# Patient Record
Sex: Female | Born: 1950 | Race: White | Hispanic: No | Marital: Married | Smoking: Former smoker
Health system: Southern US, Community
[De-identification: ages and names within clinical notes are randomized; demographics above are authoritative.]

## PROBLEM LIST (undated history)

## (undated) DIAGNOSIS — M797 Fibromyalgia: Secondary | ICD-10-CM

## (undated) DIAGNOSIS — R569 Unspecified convulsions: Secondary | ICD-10-CM

## (undated) DIAGNOSIS — F419 Anxiety disorder, unspecified: Secondary | ICD-10-CM

## (undated) DIAGNOSIS — F32A Depression, unspecified: Secondary | ICD-10-CM

## (undated) DIAGNOSIS — Z8489 Family history of other specified conditions: Secondary | ICD-10-CM

## (undated) DIAGNOSIS — I10 Essential (primary) hypertension: Secondary | ICD-10-CM

## (undated) DIAGNOSIS — D649 Anemia, unspecified: Secondary | ICD-10-CM

## (undated) DIAGNOSIS — E039 Hypothyroidism, unspecified: Secondary | ICD-10-CM

## (undated) DIAGNOSIS — M1990 Unspecified osteoarthritis, unspecified site: Secondary | ICD-10-CM

## (undated) DIAGNOSIS — J449 Chronic obstructive pulmonary disease, unspecified: Secondary | ICD-10-CM

## (undated) DIAGNOSIS — R519 Headache, unspecified: Secondary | ICD-10-CM

## (undated) DIAGNOSIS — J45909 Unspecified asthma, uncomplicated: Secondary | ICD-10-CM

## (undated) DIAGNOSIS — I2510 Atherosclerotic heart disease of native coronary artery without angina pectoris: Secondary | ICD-10-CM

## (undated) DIAGNOSIS — G4730 Sleep apnea, unspecified: Secondary | ICD-10-CM

## (undated) DIAGNOSIS — G629 Polyneuropathy, unspecified: Secondary | ICD-10-CM

## (undated) DIAGNOSIS — R51 Headache: Secondary | ICD-10-CM

## (undated) DIAGNOSIS — G2581 Restless legs syndrome: Secondary | ICD-10-CM

## (undated) DIAGNOSIS — R0600 Dyspnea, unspecified: Secondary | ICD-10-CM

## (undated) DIAGNOSIS — E119 Type 2 diabetes mellitus without complications: Secondary | ICD-10-CM

## (undated) DIAGNOSIS — I499 Cardiac arrhythmia, unspecified: Secondary | ICD-10-CM

## (undated) DIAGNOSIS — F329 Major depressive disorder, single episode, unspecified: Secondary | ICD-10-CM

## (undated) DIAGNOSIS — R918 Other nonspecific abnormal finding of lung field: Secondary | ICD-10-CM

## (undated) HISTORY — PX: BACK SURGERY: SHX140

## (undated) HISTORY — PX: CORONARY ANGIOPLASTY: SHX604

## (undated) HISTORY — PX: TUBAL LIGATION: SHX77

## (undated) HISTORY — PX: TONSILLECTOMY: SUR1361

## (undated) HISTORY — PX: HAND SURGERY: SHX662

## (undated) HISTORY — PX: EYE SURGERY: SHX253

## (undated) HISTORY — PX: SHOULDER SURGERY: SHX246

---

## 1970-12-29 HISTORY — PX: APPENDECTOMY: SHX54

## 1972-12-29 HISTORY — PX: CHOLECYSTECTOMY: SHX55

## 1998-12-29 HISTORY — PX: ABDOMINAL HYSTERECTOMY: SHX81

## 2003-12-30 HISTORY — PX: CARDIAC CATHETERIZATION: SHX172

## 2009-11-30 ENCOUNTER — Encounter: Admission: RE | Admit: 2009-11-30 | Discharge: 2009-11-30 | Payer: Self-pay | Admitting: Orthopedic Surgery

## 2010-03-01 ENCOUNTER — Encounter: Admission: RE | Admit: 2010-03-01 | Discharge: 2010-03-01 | Payer: Self-pay | Admitting: Orthopedic Surgery

## 2014-10-23 ENCOUNTER — Other Ambulatory Visit: Payer: Self-pay | Admitting: Neurological Surgery

## 2014-10-30 ENCOUNTER — Encounter (HOSPITAL_COMMUNITY): Payer: Self-pay

## 2014-10-30 ENCOUNTER — Encounter (HOSPITAL_COMMUNITY)
Admission: RE | Admit: 2014-10-30 | Discharge: 2014-10-30 | Disposition: A | Discharge status: Home / Self Care | Payer: Medicare Other | Source: Ambulatory Visit | Attending: Neurological Surgery | Admitting: Neurological Surgery

## 2014-10-30 DIAGNOSIS — M797 Fibromyalgia: Secondary | ICD-10-CM | POA: Insufficient documentation

## 2014-10-30 DIAGNOSIS — F329 Major depressive disorder, single episode, unspecified: Secondary | ICD-10-CM | POA: Diagnosis not present

## 2014-10-30 DIAGNOSIS — Z22321 Carrier or suspected carrier of Methicillin susceptible Staphylococcus aureus: Secondary | ICD-10-CM | POA: Insufficient documentation

## 2014-10-30 DIAGNOSIS — Z01818 Encounter for other preprocedural examination: Secondary | ICD-10-CM | POA: Insufficient documentation

## 2014-10-30 DIAGNOSIS — I10 Essential (primary) hypertension: Secondary | ICD-10-CM | POA: Diagnosis not present

## 2014-10-30 DIAGNOSIS — Z87891 Personal history of nicotine dependence: Secondary | ICD-10-CM | POA: Diagnosis not present

## 2014-10-30 DIAGNOSIS — G4733 Obstructive sleep apnea (adult) (pediatric): Secondary | ICD-10-CM | POA: Insufficient documentation

## 2014-10-30 DIAGNOSIS — J45909 Unspecified asthma, uncomplicated: Secondary | ICD-10-CM | POA: Diagnosis not present

## 2014-10-30 DIAGNOSIS — D649 Anemia, unspecified: Secondary | ICD-10-CM | POA: Insufficient documentation

## 2014-10-30 DIAGNOSIS — I2510 Atherosclerotic heart disease of native coronary artery without angina pectoris: Secondary | ICD-10-CM | POA: Insufficient documentation

## 2014-10-30 DIAGNOSIS — R0789 Other chest pain: Secondary | ICD-10-CM | POA: Insufficient documentation

## 2014-10-30 DIAGNOSIS — J449 Chronic obstructive pulmonary disease, unspecified: Secondary | ICD-10-CM | POA: Diagnosis not present

## 2014-10-30 DIAGNOSIS — Z9049 Acquired absence of other specified parts of digestive tract: Secondary | ICD-10-CM | POA: Diagnosis not present

## 2014-10-30 DIAGNOSIS — I498 Other specified cardiac arrhythmias: Secondary | ICD-10-CM | POA: Diagnosis not present

## 2014-10-30 DIAGNOSIS — R002 Palpitations: Secondary | ICD-10-CM | POA: Insufficient documentation

## 2014-10-30 DIAGNOSIS — F419 Anxiety disorder, unspecified: Secondary | ICD-10-CM | POA: Insufficient documentation

## 2014-10-30 DIAGNOSIS — E039 Hypothyroidism, unspecified: Secondary | ICD-10-CM | POA: Insufficient documentation

## 2014-10-30 DIAGNOSIS — Z90710 Acquired absence of both cervix and uterus: Secondary | ICD-10-CM | POA: Diagnosis not present

## 2014-10-30 DIAGNOSIS — M48061 Spinal stenosis, lumbar region without neurogenic claudication: Secondary | ICD-10-CM

## 2014-10-30 HISTORY — DX: Headache: R51

## 2014-10-30 HISTORY — DX: Headache, unspecified: R51.9

## 2014-10-30 HISTORY — DX: Hypothyroidism, unspecified: E03.9

## 2014-10-30 HISTORY — DX: Cardiac arrhythmia, unspecified: I49.9

## 2014-10-30 HISTORY — DX: Unspecified asthma, uncomplicated: J45.909

## 2014-10-30 HISTORY — DX: Essential (primary) hypertension: I10

## 2014-10-30 HISTORY — DX: Atherosclerotic heart disease of native coronary artery without angina pectoris: I25.10

## 2014-10-30 HISTORY — DX: Depression, unspecified: F32.A

## 2014-10-30 HISTORY — DX: Chronic obstructive pulmonary disease, unspecified: J44.9

## 2014-10-30 HISTORY — DX: Anxiety disorder, unspecified: F41.9

## 2014-10-30 HISTORY — DX: Sleep apnea, unspecified: G47.30

## 2014-10-30 HISTORY — DX: Major depressive disorder, single episode, unspecified: F32.9

## 2014-10-30 HISTORY — DX: Unspecified osteoarthritis, unspecified site: M19.90

## 2014-10-30 HISTORY — DX: Anemia, unspecified: D64.9

## 2014-10-30 HISTORY — DX: Fibromyalgia: M79.7

## 2014-10-30 HISTORY — DX: Family history of other specified conditions: Z84.89

## 2014-10-30 LAB — PROTIME-INR
INR: 1.09 (ref 0.00–1.49)
Prothrombin Time: 14.3 seconds (ref 11.6–15.2)

## 2014-10-30 LAB — BASIC METABOLIC PANEL
ANION GAP: 15 (ref 5–15)
BUN: 26 mg/dL — ABNORMAL HIGH (ref 6–23)
CHLORIDE: 96 meq/L (ref 96–112)
CO2: 26 meq/L (ref 19–32)
Calcium: 10 mg/dL (ref 8.4–10.5)
Creatinine, Ser: 0.99 mg/dL (ref 0.50–1.10)
GFR calc Af Amer: 69 mL/min — ABNORMAL LOW (ref >90)
GFR calc non Af Amer: 59 mL/min — ABNORMAL LOW (ref >90)
Glucose, Bld: 182 mg/dL — ABNORMAL HIGH (ref 70–99)
POTASSIUM: 3.4 meq/L — AB (ref 3.7–5.3)
Sodium: 137 mEq/L (ref 137–147)

## 2014-10-30 LAB — CBC WITH DIFFERENTIAL/PLATELET
Basophils Absolute: 0.1 10*3/uL (ref 0.0–0.1)
Basophils Relative: 1 % (ref 0–1)
Eosinophils Absolute: 0.1 10*3/uL (ref 0.0–0.7)
Eosinophils Relative: 2 % (ref 0–5)
HEMATOCRIT: 36.8 % (ref 36.0–46.0)
HEMOGLOBIN: 12.3 g/dL (ref 12.0–15.0)
LYMPHS ABS: 1.9 10*3/uL (ref 0.7–4.0)
LYMPHS PCT: 32 % (ref 12–46)
MCH: 29.2 pg (ref 26.0–34.0)
MCHC: 33.4 g/dL (ref 30.0–36.0)
MCV: 87.4 fL (ref 78.0–100.0)
MONOS PCT: 10 % (ref 3–12)
Monocytes Absolute: 0.6 10*3/uL (ref 0.1–1.0)
NEUTROS ABS: 3.2 10*3/uL (ref 1.7–7.7)
NEUTROS PCT: 55 % (ref 43–77)
Platelets: 201 10*3/uL (ref 150–400)
RBC: 4.21 MIL/uL (ref 3.87–5.11)
RDW: 15 % (ref 11.5–15.5)
WBC: 5.8 10*3/uL (ref 4.0–10.5)

## 2014-10-30 LAB — SURGICAL PCR SCREEN
MRSA, PCR: NEGATIVE
STAPHYLOCOCCUS AUREUS: POSITIVE — AB

## 2014-10-30 NOTE — Pre-Procedure Instructions (Signed)
Elder NegusLinda Benoist  10/30/2014   Your procedure is scheduled on:  Friday November 6 th at 0730 AM  [facility] at [facility] AM.  Call this number if you have problems the morning of surgery: [phone]   Remember:   Do not eat food or drink liquids after midnight.   Take these medicines the morning of surgery with A SIP OF WATER: Buspar, Klonopin, Gabapentin, Hydrocodone if needed for pain, Synthroid, Singular, Phenytoin Metoprolol,Singular, Phenytoin and Effexor   Do not wear jewelry, make-up or nail polish.  Do not wear lotions, powders, or perfumes. You may not wear deodorant.  Do not shave 48 hours prior to surgery.   Do not bring valuables to the hospital.  [facility] is not responsible for any belongings or valuables.               Contacts, dentures or bridgework may not be worn into surgery.  Leave suitcase in the car. After surgery it may be brought to your room.  For patients admitted to the hospital, discharge time is determined by your treatment team.               Patients discharged the day of surgery will not be allowed to drive home.    Special Instructions: [place] - Preparing for Surgery  Before surgery, you can play an important role.  Because skin is not sterile, your skin needs to be as free of germs as possible.  You can reduce the number of germs on you skin by washing with CHG (chlorahexidine gluconate) soap before surgery.  CHG is an antiseptic cleaner which kills germs and bonds with the skin to continue killing germs even after washing.  Please DO NOT use if you have an allergy to CHG or antibacterial soaps.  If your skin becomes reddened/irritated stop using the CHG and inform your nurse when you arrive at Short Stay.  Do not shave (including legs and underarms) for at least 48 hours prior to the first CHG shower.  You may shave your face.  Please follow these instructions carefully:   1.  Shower with CHG Soap the night before  surgery and the                                morning of Surgery.  2.  If you choose to wash your hair, wash your hair first as usual with your       normal shampoo.  3.  After you shampoo, rinse your hair and body thoroughly to remove the                      Shampoo.  4.  Use CHG as you would any other liquid soap.  You can apply chg directly       to the skin and wash gently with scrungie or a clean washcloth.  5.  Apply the CHG Soap to your body ONLY FROM THE NECK DOWN.        Do not use on open wounds or open sores.  Avoid contact with your eyes,       ears, mouth and genitals (private parts).  Wash genitals (private parts)       with your normal soap.  6.  Wash thoroughly, paying special attention to the area where your surgery        will be performed.  7.  Thoroughly rinse your body with warm water from the neck down.  8.  DO NOT shower/wash with your normal soap after using and rinsing off       the CHG Soap.  9.  Pat yourself dry with a clean towel.            10.  Wear clean pajamas.            11.  Place clean sheets on your bed the night of your first shower and do not        sleep with pets.  Day of Surgery  Do not apply any lotions/deoderants the morning of surgery.  Please wear clean clothes to the hospital/surgery center.      Please read over the following fact sheets that you were given: Pain Booklet, Coughing and Deep Breathing, MRSA Information and Surgical Site Infection Prevention

## 2014-10-31 NOTE — Progress Notes (Addendum)
Anesthesia Chart Review: Patient is a 63 year old female scheduled for left L3-4 intra/extra- foraminal microdiskectomy on 11/03/14 by Dr. Yetta BarreJones.   History includes former smoker, HTN, CAD s/p LAD stent ~ '05 (s/p PCI to LAD 2008 mild disease elsewhere at that time according to cardiology notes), COPD, OSA with CPAP use, asthma, depression, fibromyalgia, hypothyroidism, anxiety, anemia, hysterectomy, cholecystectomy. For anesthesia history, she reported her mother woke up during a surgery. PCP is note listed, was seeing Dr. Harl Bowieathy Judge at least as of earlier this year. Cardiologist is Dr. Donaciano EvaShannon Mitch Alene MiresSt. Clair, last visit 01/25/14 according to Care Everywhere. Patient had occasional atypical chest pain and occasional palpitations.  Dr. Alene MiresSt. Clair offered a stress test if patient's symptoms worsened.  He did order an event monitor, but I'm unsure if this has been done yet or not.   Meds include albuterol, ASA 81 mg, Buspar, Klonipin, Flexeril, Lasix, Neurontin, Lopid, glipizide, Lantus, levothyroxine, lisinopril, Lopressor, Singulair, dilantin, Topamax, Effexor, Dyazide.  EKG on 10/30/14: SR with marked sinus arrhythmia, cannot rule out anterior infarct (age undetermined).  Currently, no comparison EKGs, but according to records in Care Everywhere she had changes of septal infarct cited on EKGs dating back to 02/01/12.    According to cardiology notes in Care Everywhere: -Echocardiogram April 2014 technically difficult, EF 55-60%, G1DD -July 2013 ER evaluation BNP 13, troponin negative x2 -Nuclear stress test 2012 negative for ischemia, EF 74%  Preoperative CXR and labs noted.    I'm still awaiting full cardiology testing reports, but above note and 01/25/14 cardiology office note reviewed with anesthesiologist Dr. Katrinka BlazingSmith.  Patient will be further evaluated by her assigned anesthesiologist on the day of surgery.  If no progressive or new CV symptoms then it is anticipated that she can proceed with this  procedure.  I'll review additional records if received.  Velna Ochsllison Xavian Hardcastle, PA-C [facility]/Anesthesiology Phone [phone] 10/31/2014 5:21 PM  Addendum: [facility] and [facility] received.    No 2015 event monitor.  - Echo: 04/25/13: The study was technically difficult. Images of the previous study from 2001 unavailable for comparison. The left ventricle is normal in size, wall thickness and wall motion with ejection fraction of 55-60%. Grade 1 mild diastolic dysfunction, abnormal relaxation pattern. Estimation of right ventricular systolic pressure is not possible. The aortic valve is not well visualized, but is grossly normal. Trace MR/TR/AR/PR.  - Normal nuclear cardiac perfusion scan on 07/28/11.  Velna Ochsllison Lister Brizzi, PA-C [facility]/Anesthesiology Phone [phone] 11/01/2014 10:56 AM

## 2014-10-31 NOTE — Progress Notes (Signed)
Mupirocin Ointment Rx [facility] for positive PCR of staph. Left message on pt's voicemail notifying her of same.

## 2014-11-02 MED ORDER — VANCOMYCIN HCL 10 G IV SOLR
1500.0000 mg | INTRAVENOUS | Status: AC
  Administered 2014-11-03: 1500 mg via INTRAVENOUS
  Filled 2014-11-02: qty 1500

## 2014-11-02 MED ORDER — CEFAZOLIN SODIUM-DEXTROSE 2-3 GM-% IV SOLR: 2.0000 g | INTRAVENOUS | Status: DC

## 2014-11-03 ENCOUNTER — Ambulatory Visit (HOSPITAL_COMMUNITY)
Admission: RE | Admit: 2014-11-03 | Discharge: 2014-11-04 | Disposition: A | Discharge status: Home / Self Care | Payer: Medicare Other | Source: Ambulatory Visit | Attending: Neurological Surgery | Admitting: Neurological Surgery

## 2014-11-03 ENCOUNTER — Inpatient Hospital Stay (HOSPITAL_COMMUNITY): Payer: Medicare Other | Admitting: Vascular Surgery

## 2014-11-03 ENCOUNTER — Inpatient Hospital Stay (HOSPITAL_COMMUNITY): Payer: Medicare Other | Admitting: Anesthesiology

## 2014-11-03 ENCOUNTER — Encounter (HOSPITAL_COMMUNITY)
Admission: RE | Disposition: A | Discharge status: Home / Self Care | Payer: Self-pay | Source: Ambulatory Visit | Attending: Neurological Surgery

## 2014-11-03 ENCOUNTER — Encounter (HOSPITAL_COMMUNITY): Payer: Self-pay | Admitting: *Deleted

## 2014-11-03 ENCOUNTER — Inpatient Hospital Stay (HOSPITAL_COMMUNITY): Payer: Medicare Other

## 2014-11-03 DIAGNOSIS — M48061 Spinal stenosis, lumbar region without neurogenic claudication: Secondary | ICD-10-CM

## 2014-11-03 DIAGNOSIS — Z794 Long term (current) use of insulin: Secondary | ICD-10-CM | POA: Insufficient documentation

## 2014-11-03 DIAGNOSIS — E119 Type 2 diabetes mellitus without complications: Secondary | ICD-10-CM | POA: Diagnosis not present

## 2014-11-03 DIAGNOSIS — I10 Essential (primary) hypertension: Secondary | ICD-10-CM | POA: Diagnosis not present

## 2014-11-03 DIAGNOSIS — Z6841 Body Mass Index (BMI) 40.0 and over, adult: Secondary | ICD-10-CM | POA: Diagnosis not present

## 2014-11-03 DIAGNOSIS — E669 Obesity, unspecified: Secondary | ICD-10-CM | POA: Diagnosis not present

## 2014-11-03 DIAGNOSIS — M5116 Intervertebral disc disorders with radiculopathy, lumbar region: Secondary | ICD-10-CM | POA: Diagnosis present

## 2014-11-03 DIAGNOSIS — J449 Chronic obstructive pulmonary disease, unspecified: Secondary | ICD-10-CM | POA: Insufficient documentation

## 2014-11-03 DIAGNOSIS — Z79899 Other long term (current) drug therapy: Secondary | ICD-10-CM | POA: Insufficient documentation

## 2014-11-03 DIAGNOSIS — Z7982 Long term (current) use of aspirin: Secondary | ICD-10-CM | POA: Insufficient documentation

## 2014-11-03 DIAGNOSIS — Z9889 Other specified postprocedural states: Secondary | ICD-10-CM

## 2014-11-03 DIAGNOSIS — Z87891 Personal history of nicotine dependence: Secondary | ICD-10-CM | POA: Diagnosis not present

## 2014-11-03 HISTORY — DX: Type 2 diabetes mellitus without complications: E11.9

## 2014-11-03 HISTORY — PX: LUMBAR LAMINECTOMY/DECOMPRESSION MICRODISCECTOMY: SHX5026

## 2014-11-03 LAB — GLUCOSE, CAPILLARY
GLUCOSE-CAPILLARY: 128 mg/dL — AB (ref 70–99)
GLUCOSE-CAPILLARY: 209 mg/dL — AB (ref 70–99)
Glucose-Capillary: 231 mg/dL — ABNORMAL HIGH (ref 70–99)
Glucose-Capillary: 180 mg/dL — ABNORMAL HIGH (ref 70–99)

## 2014-11-03 SURGERY — LUMBAR LAMINECTOMY/DECOMPRESSION MICRODISCECTOMY 1 LEVEL
Anesthesia: General | Site: Back | Laterality: Left

## 2014-11-03 MED ORDER — ALBUTEROL SULFATE HFA 108 (90 BASE) MCG/ACT IN AERS
INHALATION_SPRAY | RESPIRATORY_TRACT | Status: AC
  Filled 2014-11-03: qty 6.7

## 2014-11-03 MED ORDER — BUSPIRONE HCL 15 MG PO TABS
30.0000 mg | ORAL_TABLET | Freq: Every day | ORAL | Status: DC
  Administered 2014-11-03: 30 mg via ORAL
  Filled 2014-11-03 (×2): qty 2

## 2014-11-03 MED ORDER — MENTHOL 3 MG MT LOZG: 1.0000 | LOZENGE | OROMUCOSAL | Status: DC | PRN

## 2014-11-03 MED ORDER — MEPERIDINE HCL 25 MG/ML IJ SOLN: 6.2500 mg | INTRAMUSCULAR | Status: DC | PRN

## 2014-11-03 MED ORDER — LIDOCAINE HCL (CARDIAC) 20 MG/ML IV SOLN
INTRAVENOUS | Status: AC
  Filled 2014-11-03: qty 5

## 2014-11-03 MED ORDER — OXYCODONE-ACETAMINOPHEN 5-325 MG PO TABS
1.0000 | ORAL_TABLET | ORAL | Status: DC | PRN
  Administered 2014-11-03 – 2014-11-04 (×4): 2 via ORAL
  Filled 2014-11-03 (×4): qty 2

## 2014-11-03 MED ORDER — EPHEDRINE SULFATE 50 MG/ML IJ SOLN
INTRAMUSCULAR | Status: AC
  Filled 2014-11-03: qty 1

## 2014-11-03 MED ORDER — CYCLOBENZAPRINE HCL 5 MG PO TABS
5.0000 mg | ORAL_TABLET | Freq: Three times a day (TID) | ORAL | Status: DC | PRN
  Filled 2014-11-03: qty 2

## 2014-11-03 MED ORDER — GABAPENTIN 300 MG PO CAPS
300.0000 mg | ORAL_CAPSULE | Freq: Three times a day (TID) | ORAL | Status: DC
  Administered 2014-11-03 – 2014-11-04 (×3): 300 mg via ORAL
  Filled 2014-11-03 (×5): qty 1

## 2014-11-03 MED ORDER — DEXAMETHASONE SODIUM PHOSPHATE 10 MG/ML IJ SOLN: 10.0000 mg | INTRAMUSCULAR | Status: DC

## 2014-11-03 MED ORDER — ALBUTEROL SULFATE HFA 108 (90 BASE) MCG/ACT IN AERS
INHALATION_SPRAY | RESPIRATORY_TRACT | Status: DC | PRN
  Administered 2014-11-03: 2 via RESPIRATORY_TRACT

## 2014-11-03 MED ORDER — LIDOCAINE HCL (CARDIAC) 20 MG/ML IV SOLN
INTRAVENOUS | Status: DC | PRN
  Administered 2014-11-03: 100 mg via INTRAVENOUS

## 2014-11-03 MED ORDER — PROMETHAZINE HCL 25 MG/ML IJ SOLN: 6.2500 mg | INTRAMUSCULAR | Status: DC | PRN

## 2014-11-03 MED ORDER — MONTELUKAST SODIUM 10 MG PO TABS
10.0000 mg | ORAL_TABLET | Freq: Every morning | ORAL | Status: DC
  Administered 2014-11-04: 10 mg via ORAL
  Filled 2014-11-03: qty 1

## 2014-11-03 MED ORDER — THROMBIN 5000 UNITS EX SOLR
OROMUCOSAL | Status: DC | PRN
  Administered 2014-11-03: 09:00:00 via TOPICAL

## 2014-11-03 MED ORDER — BUSPIRONE HCL 15 MG PO TABS: 15.0000 mg | ORAL_TABLET | Freq: Two times a day (BID) | ORAL | Status: DC

## 2014-11-03 MED ORDER — NEOSTIGMINE METHYLSULFATE 10 MG/10ML IV SOLN
INTRAVENOUS | Status: DC | PRN
  Administered 2014-11-03: 3 mg via INTRAVENOUS

## 2014-11-03 MED ORDER — LACTATED RINGERS IV SOLN
INTRAVENOUS | Status: DC | PRN
Start: 2014-11-03 — End: 2014-11-03
  Administered 2014-11-03 (×2): via INTRAVENOUS

## 2014-11-03 MED ORDER — GLUCOSE 4 G PO CHEW
1.0000 | CHEWABLE_TABLET | ORAL | Status: DC | PRN
  Filled 2014-11-03: qty 1

## 2014-11-03 MED ORDER — GLYCOPYRROLATE 0.2 MG/ML IJ SOLN
INTRAMUSCULAR | Status: DC | PRN
  Administered 2014-11-03: 0.4 mg via INTRAVENOUS

## 2014-11-03 MED ORDER — GLYCOPYRROLATE 0.2 MG/ML IJ SOLN
INTRAMUSCULAR | Status: AC
  Filled 2014-11-03: qty 4

## 2014-11-03 MED ORDER — HEMOSTATIC AGENTS (NO CHARGE) OPTIME
TOPICAL | Status: DC | PRN
  Administered 2014-11-03: 1 via TOPICAL

## 2014-11-03 MED ORDER — PROPOFOL 10 MG/ML IV BOLUS
INTRAVENOUS | Status: AC
  Filled 2014-11-03: qty 20

## 2014-11-03 MED ORDER — FENTANYL CITRATE 0.05 MG/ML IJ SOLN
INTRAMUSCULAR | Status: DC | PRN
  Administered 2014-11-03: 100 ug via INTRAVENOUS
  Administered 2014-11-03 (×2): 50 ug via INTRAVENOUS

## 2014-11-03 MED ORDER — PHENYLEPHRINE 40 MCG/ML (10ML) SYRINGE FOR IV PUSH (FOR BLOOD PRESSURE SUPPORT)
PREFILLED_SYRINGE | INTRAVENOUS | Status: AC
  Filled 2014-11-03: qty 10

## 2014-11-03 MED ORDER — SODIUM CHLORIDE 0.9 % IR SOLN
Status: DC | PRN
  Administered 2014-11-03: 08:00:00

## 2014-11-03 MED ORDER — BUPIVACAINE HCL (PF) 0.25 % IJ SOLN
INTRAMUSCULAR | Status: DC | PRN
  Administered 2014-11-03: 4 mL

## 2014-11-03 MED ORDER — PROPOFOL 10 MG/ML IV BOLUS
INTRAVENOUS | Status: DC | PRN
  Administered 2014-11-03: 180 mg via INTRAVENOUS

## 2014-11-03 MED ORDER — ARTIFICIAL TEARS OP OINT
TOPICAL_OINTMENT | OPHTHALMIC | Status: AC
  Filled 2014-11-03: qty 3.5

## 2014-11-03 MED ORDER — CELECOXIB 200 MG PO CAPS
200.0000 mg | ORAL_CAPSULE | Freq: Every day | ORAL | Status: DC
Start: 2014-11-03 — End: 2014-11-04
  Administered 2014-11-03 – 2014-11-04 (×2): 200 mg via ORAL
  Filled 2014-11-03 (×2): qty 1

## 2014-11-03 MED ORDER — FENTANYL CITRATE 0.05 MG/ML IJ SOLN
25.0000 ug | INTRAMUSCULAR | Status: DC | PRN
  Administered 2014-11-03 (×2): 50 ug via INTRAVENOUS

## 2014-11-03 MED ORDER — ONDANSETRON HCL 4 MG/2ML IJ SOLN
INTRAMUSCULAR | Status: DC | PRN
  Administered 2014-11-03: 4 mg via INTRAVENOUS

## 2014-11-03 MED ORDER — FENTANYL CITRATE 0.05 MG/ML IJ SOLN
INTRAMUSCULAR | Status: AC
Start: 2014-11-03 — End: 2014-11-03
  Filled 2014-11-03: qty 2

## 2014-11-03 MED ORDER — BUSPIRONE HCL 15 MG PO TABS
15.0000 mg | ORAL_TABLET | Freq: Every day | ORAL | Status: DC
  Administered 2014-11-04: 15 mg via ORAL
  Filled 2014-11-03: qty 1

## 2014-11-03 MED ORDER — MIDAZOLAM HCL 5 MG/5ML IJ SOLN
INTRAMUSCULAR | Status: DC | PRN
  Administered 2014-11-03: 2 mg via INTRAVENOUS

## 2014-11-03 MED ORDER — PHENYLEPHRINE HCL 10 MG/ML IJ SOLN
INTRAMUSCULAR | Status: DC | PRN
  Administered 2014-11-03: 40 ug via INTRAVENOUS
  Administered 2014-11-03 (×3): 80 ug via INTRAVENOUS
  Administered 2014-11-03: 40 ug via INTRAVENOUS
  Administered 2014-11-03: 80 ug via INTRAVENOUS
  Administered 2014-11-03: 200 ug via INTRAVENOUS

## 2014-11-03 MED ORDER — FENTANYL CITRATE 0.05 MG/ML IJ SOLN
INTRAMUSCULAR | Status: AC
  Filled 2014-11-03: qty 5

## 2014-11-03 MED ORDER — METOPROLOL TARTRATE 25 MG PO TABS
25.0000 mg | ORAL_TABLET | Freq: Every morning | ORAL | Status: DC
  Administered 2014-11-04: 25 mg via ORAL
  Filled 2014-11-03: qty 1

## 2014-11-03 MED ORDER — POTASSIUM CHLORIDE IN NACL 20-0.9 MEQ/L-% IV SOLN
INTRAVENOUS | Status: DC
  Filled 2014-11-03 (×3): qty 1000

## 2014-11-03 MED ORDER — VENLAFAXINE HCL ER 75 MG PO CP24
225.0000 mg | ORAL_CAPSULE | Freq: Every day | ORAL | Status: DC
  Administered 2014-11-04: 225 mg via ORAL
  Filled 2014-11-03 (×3): qty 1

## 2014-11-03 MED ORDER — VANCOMYCIN HCL IN DEXTROSE 1-5 GM/200ML-% IV SOLN
1000.0000 mg | Freq: Once | INTRAVENOUS | Status: AC
  Administered 2014-11-03: 1000 mg via INTRAVENOUS
  Filled 2014-11-03: qty 200

## 2014-11-03 MED ORDER — ALBUTEROL SULFATE (2.5 MG/3ML) 0.083% IN NEBU: 3.0000 mL | INHALATION_SOLUTION | Freq: Four times a day (QID) | RESPIRATORY_TRACT | Status: DC | PRN

## 2014-11-03 MED ORDER — NEOSTIGMINE METHYLSULFATE 10 MG/10ML IV SOLN
INTRAVENOUS | Status: AC
  Filled 2014-11-03: qty 1

## 2014-11-03 MED ORDER — SODIUM CHLORIDE 0.9 % IJ SOLN
INTRAMUSCULAR | Status: AC
  Filled 2014-11-03: qty 10

## 2014-11-03 MED ORDER — ONDANSETRON HCL 4 MG/2ML IJ SOLN
INTRAMUSCULAR | Status: AC
  Filled 2014-11-03: qty 2

## 2014-11-03 MED ORDER — PHENYTOIN SODIUM EXTENDED 100 MG PO CAPS
100.0000 mg | ORAL_CAPSULE | Freq: Three times a day (TID) | ORAL | Status: DC
  Administered 2014-11-03 – 2014-11-04 (×3): 100 mg via ORAL
  Filled 2014-11-03 (×5): qty 1

## 2014-11-03 MED ORDER — TOPIRAMATE 25 MG PO TABS
50.0000 mg | ORAL_TABLET | Freq: Two times a day (BID) | ORAL | Status: DC
  Administered 2014-11-03 – 2014-11-04 (×3): 50 mg via ORAL
  Filled 2014-11-03 (×4): qty 2

## 2014-11-03 MED ORDER — TRIAMTERENE-HCTZ 37.5-25 MG PO CAPS
1.0000 | ORAL_CAPSULE | Freq: Every evening | ORAL | Status: DC
  Administered 2014-11-03: 1 via ORAL
  Filled 2014-11-03 (×2): qty 1

## 2014-11-03 MED ORDER — ACETAMINOPHEN 325 MG PO TABS: 650.0000 mg | ORAL_TABLET | ORAL | Status: DC | PRN

## 2014-11-03 MED ORDER — MORPHINE SULFATE 2 MG/ML IJ SOLN: 1.0000 mg | INTRAMUSCULAR | Status: DC | PRN

## 2014-11-03 MED ORDER — TRAZODONE HCL 150 MG PO TABS
150.0000 mg | ORAL_TABLET | Freq: Every day | ORAL | Status: DC
  Administered 2014-11-03: 150 mg via ORAL
  Filled 2014-11-03 (×2): qty 1

## 2014-11-03 MED ORDER — 0.9 % SODIUM CHLORIDE (POUR BTL) OPTIME
TOPICAL | Status: DC | PRN
  Administered 2014-11-03: 1000 mL

## 2014-11-03 MED ORDER — MUPIROCIN 2 % EX OINT
TOPICAL_OINTMENT | Freq: Two times a day (BID) | CUTANEOUS | Status: DC
  Administered 2014-11-04: 09:00:00 via NASAL

## 2014-11-03 MED ORDER — ACETAMINOPHEN 650 MG RE SUPP: 650.0000 mg | RECTAL | Status: DC | PRN

## 2014-11-03 MED ORDER — VENLAFAXINE HCL 75 MG PO TABS: 225.0000 mg | ORAL_TABLET | Freq: Every morning | ORAL | Status: DC

## 2014-11-03 MED ORDER — ESTRADIOL 1 MG PO TABS
1.0000 mg | ORAL_TABLET | Freq: Every day | ORAL | Status: DC
  Administered 2014-11-04: 1 mg via ORAL
  Filled 2014-11-03: qty 1

## 2014-11-03 MED ORDER — ARTIFICIAL TEARS OP OINT
TOPICAL_OINTMENT | OPHTHALMIC | Status: DC | PRN
  Administered 2014-11-03: 1 via OPHTHALMIC

## 2014-11-03 MED ORDER — SODIUM CHLORIDE 0.9 % IJ SOLN: 3.0000 mL | INTRAMUSCULAR | Status: DC | PRN

## 2014-11-03 MED ORDER — ASPIRIN EC 81 MG PO TBEC
81.0000 mg | DELAYED_RELEASE_TABLET | Freq: Every day | ORAL | Status: DC
  Administered 2014-11-04: 81 mg via ORAL
  Filled 2014-11-03: qty 1

## 2014-11-03 MED ORDER — DEXAMETHASONE SODIUM PHOSPHATE 10 MG/ML IJ SOLN
INTRAMUSCULAR | Status: AC
Start: 2014-11-03 — End: 2014-11-03
  Administered 2014-11-03: 10 mg via INTRAVENOUS
  Filled 2014-11-03: qty 1

## 2014-11-03 MED ORDER — GLIPIZIDE 10 MG PO TABS
10.0000 mg | ORAL_TABLET | Freq: Two times a day (BID) | ORAL | Status: DC
  Administered 2014-11-03 – 2014-11-04 (×2): 10 mg via ORAL
  Filled 2014-11-03 (×4): qty 1

## 2014-11-03 MED ORDER — CLONAZEPAM 0.5 MG PO TABS
0.5000 mg | ORAL_TABLET | Freq: Three times a day (TID) | ORAL | Status: DC | PRN
Start: 2014-11-03 — End: 2014-11-04
  Administered 2014-11-03: 0.5 mg via ORAL
  Filled 2014-11-03: qty 1

## 2014-11-03 MED ORDER — FUROSEMIDE 20 MG PO TABS
20.0000 mg | ORAL_TABLET | Freq: Every day | ORAL | Status: DC
  Administered 2014-11-03 – 2014-11-04 (×2): 20 mg via ORAL
  Filled 2014-11-03 (×2): qty 1

## 2014-11-03 MED ORDER — SUCCINYLCHOLINE CHLORIDE 20 MG/ML IJ SOLN
INTRAMUSCULAR | Status: AC
  Filled 2014-11-03: qty 1

## 2014-11-03 MED ORDER — ROCURONIUM BROMIDE 50 MG/5ML IV SOLN
INTRAVENOUS | Status: AC
  Filled 2014-11-03: qty 1

## 2014-11-03 MED ORDER — ROCURONIUM BROMIDE 100 MG/10ML IV SOLN
INTRAVENOUS | Status: DC | PRN
  Administered 2014-11-03: 50 mg via INTRAVENOUS

## 2014-11-03 MED ORDER — ONDANSETRON HCL 4 MG/2ML IJ SOLN: 4.0000 mg | INTRAMUSCULAR | Status: DC | PRN

## 2014-11-03 MED ORDER — SENNA 8.6 MG PO TABS
1.0000 | ORAL_TABLET | Freq: Two times a day (BID) | ORAL | Status: DC
  Administered 2014-11-03 – 2014-11-04 (×2): 8.6 mg via ORAL
  Filled 2014-11-03 (×3): qty 1

## 2014-11-03 MED ORDER — SODIUM CHLORIDE 0.9 % IJ SOLN
3.0000 mL | Freq: Two times a day (BID) | INTRAMUSCULAR | Status: DC
  Administered 2014-11-03 (×2): 3 mL via INTRAVENOUS

## 2014-11-03 MED ORDER — LEVOTHYROXINE SODIUM 100 MCG PO TABS
100.0000 ug | ORAL_TABLET | Freq: Every day | ORAL | Status: DC
  Administered 2014-11-04: 100 ug via ORAL
  Filled 2014-11-03 (×2): qty 1

## 2014-11-03 MED ORDER — LISINOPRIL 10 MG PO TABS
10.0000 mg | ORAL_TABLET | Freq: Every day | ORAL | Status: DC
  Administered 2014-11-04: 10 mg via ORAL
  Filled 2014-11-03: qty 1

## 2014-11-03 MED ORDER — MIDAZOLAM HCL 2 MG/2ML IJ SOLN
INTRAMUSCULAR | Status: AC
  Filled 2014-11-03: qty 2

## 2014-11-03 MED ORDER — PHENOL 1.4 % MT LIQD: 1.0000 | OROMUCOSAL | Status: DC | PRN

## 2014-11-03 MED ORDER — GEMFIBROZIL 600 MG PO TABS
600.0000 mg | ORAL_TABLET | Freq: Two times a day (BID) | ORAL | Status: DC
Start: 2014-11-03 — End: 2014-11-04
  Administered 2014-11-03 – 2014-11-04 (×2): 600 mg via ORAL
  Filled 2014-11-03 (×4): qty 1

## 2014-11-03 MED ORDER — THROMBIN 5000 UNITS EX SOLR
CUTANEOUS | Status: DC | PRN
  Administered 2014-11-03 (×2): 5000 [IU] via TOPICAL

## 2014-11-03 SURGICAL SUPPLY — 46 items
BAG DECANTER FOR FLEXI CONT (MISCELLANEOUS) ×2 IMPLANT
BENZOIN TINCTURE PRP APPL 2/3 (GAUZE/BANDAGES/DRESSINGS) ×2 IMPLANT
BUR MATCHSTICK NEURO 3.0 LAGG (BURR) ×2 IMPLANT
CANISTER SUCT 3000ML (MISCELLANEOUS) ×2 IMPLANT
CONT SPEC 4OZ CLIKSEAL STRL BL (MISCELLANEOUS) ×2 IMPLANT
DRAPE LAPAROTOMY 100X72X124 (DRAPES) ×2 IMPLANT
DRAPE MICROSCOPE LEICA (MISCELLANEOUS) ×2 IMPLANT
DRAPE POUCH INSTRU U-SHP 10X18 (DRAPES) ×2 IMPLANT
DRAPE SURG 17X23 STRL (DRAPES) ×2 IMPLANT
DRSG OPSITE 4X5.5 SM (GAUZE/BANDAGES/DRESSINGS) ×2 IMPLANT
DRSG TELFA 3X8 NADH (GAUZE/BANDAGES/DRESSINGS) ×2 IMPLANT
DURAPREP 26ML APPLICATOR (WOUND CARE) ×2 IMPLANT
ELECT REM PT RETURN 9FT ADLT (ELECTROSURGICAL) ×2
ELECTRODE REM PT RTRN 9FT ADLT (ELECTROSURGICAL) ×1 IMPLANT
GAUZE SPONGE 4X4 16PLY XRAY LF (GAUZE/BANDAGES/DRESSINGS) IMPLANT
GLOVE BIO SURGEON STRL SZ8 (GLOVE) ×4 IMPLANT
GLOVE BIOGEL PI IND STRL 7.0 (GLOVE) ×1 IMPLANT
GLOVE BIOGEL PI IND STRL 8 (GLOVE) ×3 IMPLANT
GLOVE BIOGEL PI INDICATOR 7.0 (GLOVE) ×1
GLOVE BIOGEL PI INDICATOR 8 (GLOVE) ×3
GLOVE ECLIPSE 7.5 STRL STRAW (GLOVE) ×6 IMPLANT
GLOVE ECLIPSE 9.0 STRL (GLOVE) ×2 IMPLANT
GOWN STRL REUS W/ TWL LRG LVL3 (GOWN DISPOSABLE) IMPLANT
GOWN STRL REUS W/ TWL XL LVL3 (GOWN DISPOSABLE) ×2 IMPLANT
GOWN STRL REUS W/TWL 2XL LVL3 (GOWN DISPOSABLE) ×2 IMPLANT
GOWN STRL REUS W/TWL LRG LVL3 (GOWN DISPOSABLE)
GOWN STRL REUS W/TWL XL LVL3 (GOWN DISPOSABLE) ×2
HEMOSTAT POWDER KIT SURGIFOAM (HEMOSTASIS) IMPLANT
KIT BASIN OR (CUSTOM PROCEDURE TRAY) ×2 IMPLANT
KIT ROOM TURNOVER OR (KITS) ×2 IMPLANT
NEEDLE HYPO 25X1 1.5 SAFETY (NEEDLE) ×2 IMPLANT
NEEDLE SPNL 20GX3.5 QUINCKE YW (NEEDLE) IMPLANT
NS IRRIG 1000ML POUR BTL (IV SOLUTION) ×2 IMPLANT
PACK LAMINECTOMY NEURO (CUSTOM PROCEDURE TRAY) ×2 IMPLANT
PAD ARMBOARD 7.5X6 YLW CONV (MISCELLANEOUS) ×6 IMPLANT
RUBBERBAND STERILE (MISCELLANEOUS) ×4 IMPLANT
SPONGE SURGIFOAM ABS GEL SZ50 (HEMOSTASIS) ×2 IMPLANT
STRIP CLOSURE SKIN 1/2X4 (GAUZE/BANDAGES/DRESSINGS) ×2 IMPLANT
SUT VIC AB 0 CT1 18XCR BRD8 (SUTURE) ×1 IMPLANT
SUT VIC AB 0 CT1 8-18 (SUTURE) ×1
SUT VIC AB 2-0 CP2 18 (SUTURE) ×2 IMPLANT
SUT VIC AB 3-0 SH 8-18 (SUTURE) ×2 IMPLANT
SYR 20ML ECCENTRIC (SYRINGE) ×2 IMPLANT
TOWEL OR 17X24 6PK STRL BLUE (TOWEL DISPOSABLE) ×2 IMPLANT
TOWEL OR 17X26 10 PK STRL BLUE (TOWEL DISPOSABLE) ×2 IMPLANT
WATER STERILE IRR 1000ML POUR (IV SOLUTION) ×2 IMPLANT

## 2014-11-03 NOTE — Anesthesia Preprocedure Evaluation (Addendum)
Anesthesia Evaluation  Patient identified by MRN, date of birth, ID band Patient awake    Reviewed: Allergy & Precautions, H&P , NPO status , Patient's Chart, lab work & pertinent test results  Airway Mallampati: II  TM Distance: <3 FB Neck ROM: Full    Dental  (+) Edentulous Upper, Edentulous Lower, Dental Advisory Given   Pulmonary asthma , sleep apnea and Continuous Positive Airway Pressure Ventilation ,  COPD inhaler, former smoker (60 pack year),  breath sounds clear to auscultation        Cardiovascular hypertension, Pt. on medications Rhythm:Regular  ECHO 2014 EF 55%, STENT 2008, sees cardiologist regularly, last 12/2013   Neuro/Psych    GI/Hepatic   Endo/Other  diabetes, Poorly Controlled, Type 2Hypothyroidism   Renal/GU      Musculoskeletal   Abdominal (+) + obese,   Peds  Hematology H/H 12/36   Anesthesia Other Findings   Reproductive/Obstetrics                          Anesthesia Physical Anesthesia Plan  ASA: IV  Anesthesia Plan: General   Post-op Pain Management:    Induction: Intravenous  Airway Management Planned: Oral ETT  Additional Equipment:   Intra-op Plan:   Post-operative Plan: Extubation in OR  Informed Consent: I have reviewed the patients History and Physical, chart, labs and discussed the procedure including the risks, benefits and alternatives for the proposed anesthesia with the patient or authorized representative who has indicated his/her understanding and acceptance.     Plan Discussed with:   Anesthesia Plan Comments:         Anesthesia Quick Evaluation

## 2014-11-03 NOTE — Plan of Care (Signed)
Problem: Consults Goal: Spinal Surgery Patient Education See Patient Education Module for education specifics. Outcome: Completed/Met Date Met:  11/03/14

## 2014-11-03 NOTE — Plan of Care (Signed)
Problem: Consults Goal: Diagnosis - Spinal Surgery Outcome: Completed/Met Date Met:  11/03/14 Microdiscectomy

## 2014-11-03 NOTE — Anesthesia Procedure Notes (Signed)
Procedure Name: Intubation Date/Time: 11/03/2014 7:32 AM Performed by: Jefm MilesENNIE, Donnae Michels E Pre-anesthesia Checklist: Patient identified, Emergency Drugs available, Suction available, Patient being monitored and Timeout performed Patient Re-evaluated:Patient Re-evaluated prior to inductionOxygen Delivery Method: Circle system utilized Preoxygenation: Pre-oxygenation with 100% oxygen Intubation Type: IV induction Ventilation: Mask ventilation without difficulty Laryngoscope Size: Mac and 3 Grade View: Grade I Tube type: Oral Tube size: 7.0 mm Number of attempts: 1 Airway Equipment and Method: Stylet Placement Confirmation: ETT inserted through vocal cords under direct vision,  positive ETCO2 and breath sounds checked- equal and bilateral Secured at: 21 cm Tube secured with: Tape Dental Injury: Teeth and Oropharynx as per pre-operative assessment

## 2014-11-03 NOTE — Transfer of Care (Signed)
Immediate Anesthesia Transfer of Care Note  Patient: Jenna NegusLinda Butler  Procedure(s) Performed: Procedure(s): Left Lumbar three/four Intra/Extraformainal Microdiskectomy  (Left)  Patient Location: PACU  Anesthesia Type:General  Level of Consciousness: awake and oriented  Airway & Oxygen Therapy: Patient Spontanous Breathing and Patient connected to nasal cannula oxygen  Post-op Assessment: Report given to PACU RN and Patient moving all extremities X 4  Post vital signs: Reviewed and stable  Complications: No apparent anesthesia complications

## 2014-11-03 NOTE — Anesthesia Postprocedure Evaluation (Signed)
  Anesthesia Post-op Note  Patient: Jenna NegusLinda Butler  Procedure(s) Performed: Procedure(s): Left Lumbar three/four Intra/Extraformainal Microdiskectomy  (Left)  Patient Location: PACU  Anesthesia Type:General  Level of Consciousness: awake and alert   Airway and Oxygen Therapy: Patient Spontanous Breathing  Post-op Pain: mild  Post-op Assessment: Post-op Vital signs reviewed, Patient's Cardiovascular Status Stable, Respiratory Function Stable, Patent Airway and No signs of Nausea or vomiting  Post-op Vital Signs: Reviewed and stable  Last Vitals:  Filed Vitals:   11/03/14 1008  BP: 136/81  Pulse:   Temp: 36.4 C  Resp:     Complications: No apparent anesthesia complications

## 2014-11-03 NOTE — H&P (Signed)
Subjective: Patient is a 63 y.o. female admitted for L leg pain. Onset of symptoms was several months ago, gradually worsening since that time.  The pain is rated severe, and is located at the across the lower back and radiates to L leg. The pain is described as aching and occurs all day. The symptoms have been progressive. Symptoms are exacerbated by exercise. MRI or CT showed L L3-4 HNP   Past Medical History  Diagnosis Date  . Family history of adverse reaction to anesthesia     mother woke up during surgery  . Hypertension   . Dysrhythmia   . Coronary artery disease   . COPD (chronic obstructive pulmonary disease)   . Sleep apnea     uses CPAP  . Asthma   . Hypothyroidism   . Anxiety   . Depression   . Headache   . Arthritis   . Fibromyalgia   . Anemia   . Diabetes mellitus without complication     Past Surgical History  Procedure Laterality Date  . Tonsillectomy    . Cardiac catheterization  2005    1 stent [facility]  . Abdominal hysterectomy  2000  . Appendectomy  1972  . Fracture surgery Bilateral     right and left arms age 63  . Cholecystectomy  1974    Prior to Admission medications   Medication Sig Start Date End Date Taking? Authorizing Provider  aspirin EC 81 MG tablet Take 81 mg by mouth daily.   Yes Historical Provider, MD  busPIRone (BUSPAR) 15 MG tablet Take 15-30 mg by mouth 2 (two) times daily. Take 15 mg in the morning and 30 mg in the evening.   Yes Historical Provider, MD  celecoxib (CELEBREX) 200 MG capsule Take 200 mg by mouth daily.   Yes Historical Provider, MD  clonazePAM (KLONOPIN) 0.5 MG tablet Take 0.5 mg by mouth 3 (three) times daily as needed for anxiety.   Yes Historical Provider, MD  cyclobenzaprine (FLEXERIL) 10 MG tablet Take 5-10 mg by mouth 3 (three) times daily as needed for muscle spasms.   Yes Historical Provider, MD  estradiol (ESTRACE) 1 MG tablet Take 1 mg by mouth daily.   Yes Historical Provider, MD  furosemide  (LASIX) 20 MG tablet Take 20 mg by mouth daily.   Yes Historical Provider, MD  gabapentin (NEURONTIN) 300 MG capsule Take 300 mg by mouth 3 (three) times daily.   Yes Historical Provider, MD  gemfibrozil (LOPID) 600 MG tablet Take 600 mg by mouth 2 (two) times daily before a meal.   Yes Historical Provider, MD  glipiZIDE (GLUCOTROL) 10 MG tablet Take 10 mg by mouth 2 (two) times daily before a meal.   Yes Historical Provider, MD  HYDROcodone-acetaminophen (NORCO) 10-325 MG per tablet Take 1 tablet by mouth daily as needed for severe pain.    Yes Historical Provider, MD  insulin glargine (LANTUS) 100 UNIT/ML injection Inject 21 Units into the skin at bedtime.   Yes Historical Provider, MD  levothyroxine (SYNTHROID, LEVOTHROID) 100 MCG tablet Take 100 mcg by mouth daily before breakfast.   Yes Historical Provider, MD  lisinopril (PRINIVIL,ZESTRIL) 20 MG tablet Take 10 mg by mouth daily.   Yes Historical Provider, MD  metoprolol tartrate (LOPRESSOR) 25 MG tablet Take 25 mg by mouth every morning.   Yes Historical Provider, MD  montelukast (SINGULAIR) 10 MG tablet Take 10 mg by mouth every morning.   Yes Historical Provider, MD  phenytoin (DILANTIN) 100  MG ER capsule Take 100 mg by mouth 3 (three) times daily.   Yes Historical Provider, MD  topiramate (TOPAMAX) 50 MG tablet Take 50 mg by mouth 2 (two) times daily.   Yes Historical Provider, MD  traZODone (DESYREL) 150 MG tablet Take 150 mg by mouth at bedtime.   Yes Historical Provider, MD  triamterene-hydrochlorothiazide (DYAZIDE) 37.5-25 MG per capsule Take 1 capsule by mouth every evening.   Yes Historical Provider, MD  venlafaxine (EFFEXOR) 75 MG tablet Take 225 mg by mouth every morning.   Yes Historical Provider, MD  albuterol (PROVENTIL HFA;VENTOLIN HFA) 108 (90 BASE) MCG/ACT inhaler Inhale 2 puffs into the lungs every 6 (six) hours as needed for wheezing or shortness of breath.    Historical Provider, MD  glucose 4 GM chewable tablet Chew 1  tablet by mouth as needed for low blood sugar.    Historical Provider, MD   Allergies  Allergen Reactions  . Ceclor [Cefaclor] Shortness Of Breath, Itching and Swelling  . Lyrica [Pregabalin] Other (See Comments)    "causes me to be very depressed."  . Tape     "tears my skin."    History  Substance Use Topics  . Smoking status: Former Smoker -- 2.00 packs/day for 30 years    Quit date: 10/30/1997  . Smokeless tobacco: Never Used  . Alcohol Use: No    History reviewed. No pertinent family history.   Review of Systems  Positive ROS: neg  All other systems have been reviewed and were otherwise negative with the exception of those mentioned in the HPI and as above.  Objective: Vital signs in last 24 hours: Temp:  [97.9 F (36.6 C)] 97.9 F (36.6 C) (11/06 1610) Pulse Rate:  [88] 88 (11/06 0632) Resp:  [18] 18 (11/06 9604) BP: (118)/(59) 118/59 mmHg (11/06 0632) SpO2:  [97 %] 97 % (11/06 5409) Weight:  [277 lb (125.646 kg)] 277 lb (125.646 kg) (11/06 8119)  General Appearance: Alert, cooperative, no distress, appears stated age Head: Normocephalic, without obvious abnormality, atraumatic Eyes: PERRL, conjunctiva/corneas clear, EOM's intact    Neck: Supple, symmetrical, trachea midline Back: Symmetric, no curvature, ROM normal, no CVA tenderness Lungs:  respirations unlabored Heart: Regular rate and rhythm Abdomen: Soft, non-tender Extremities: Extremities normal, atraumatic, no cyanosis or edema Pulses: 2+ and symmetric all extremities Skin: Skin color, texture, turgor normal, no rashes or lesions  NEUROLOGIC:   Mental status: Alert and oriented x4,  no aphasia, good attention span, fund of knowledge, and memory Motor Exam - grossly normal Sensory Exam - grossly normal Reflexes: 1+ Coordination - grossly normal Gait - grossly normal Balance - grossly normal Cranial Nerves: I: smell Not tested  II: visual acuity  OS: nl    OD: nl  II: visual fields Full to  confrontation  II: pupils Equal, round, reactive to light  III,VII: ptosis None  III,IV,VI: extraocular muscles  Full ROM  V: mastication Normal  V: facial light touch sensation  Normal  V,VII: corneal reflex  Present  VII: facial muscle function - upper  Normal  VII: facial muscle function - lower Normal  VIII: hearing Not tested  IX: soft palate elevation  Normal  IX,X: gag reflex Present  XI: trapezius strength  5/5  XI: sternocleidomastoid strength 5/5  XI: neck flexion strength  5/5  XII: tongue strength  Normal    Data Review Lab Results  Component Value Date   WBC 5.8 10/30/2014   HGB 12.3 10/30/2014   HCT 36.8  10/30/2014   MCV 87.4 10/30/2014   PLT 201 10/30/2014   Lab Results  Component Value Date   NA 137 10/30/2014   K 3.4* 10/30/2014   CL 96 10/30/2014   CO2 26 10/30/2014   BUN 26* 10/30/2014   CREATININE 0.99 10/30/2014   GLUCOSE 182* 10/30/2014   Lab Results  Component Value Date   INR 1.09 10/30/2014    Assessment/Plan: Patient admitted for L L3-4 microdiskectomy. Patient has failed a reasonable attempt at conservative therapy.  I explained the condition and procedure to the patient and answered any questions.  Patient wishes to proceed with procedure as planned. Understands risks/ benefits and typical outcomes of procedure.   Dannon Perlow S 11/03/2014 7:15 AM

## 2014-11-03 NOTE — Op Note (Signed)
11/03/2014  9:57 AM  PATIENT:  Jenna Butler  63 y.o. female  PRE-OPERATIVE DIAGNOSIS:  . 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 Left L3-4 extra foraminal microdiscectomy, 2. Left L3-4 redo hemilaminectomy, medial facetectomy, and foraminotomies followed bymicrodiscectomy utilizing microdissection  SURGEON:  Marikay Alaravid Jones, MD  ASSISTANTS: Dr. Jordan LikesPool  ANESTHESIA:   General  EBL: 50 ml  Total I/O In: 1500 [I.V.:1500] Out: 50 [Blood:50]  BLOOD ADMINISTERED:none  DRAINS: none   SPECIMEN:  No Specimen  INDICATION FOR PROCEDURE: this patient underwent a previous left L3-4 L4-5 and L5-S1 hemilaminectomy and microdiscectomy at another institution. She presented with recurrent left leg pain and at L3 and L4 radicular distribution. She had an MRI which showed a large recurrent disc herniation at L3-4 with both an intraforaminal and extraforaminal component and a large superior fragment and the extraforaminal space. She tried medical management without relief. Her pain was severe. I recommended an extraforaminal microdiscectomy followed by redo hemilaminectomy, medial facetectomy and foraminotomywith redo microdiscectomy at L4-5 on the left. Patient understood the risks, benefits, and alternatives and potential outcomes and wished to proceed.  PROCEDURE DETAILS: The patient was taken to the operating room and after induction of adequate generalized endotracheal anesthesia, the patient was rolled into the prone position on the Wilson frame and all pressure points were padded. The lumbar region was cleaned and then prepped with DuraPrep and draped in the usual sterile fashion. 5 cc of local anesthesia was injected and then a dorsal midline incision was made and carried down to the lumbo sacral fascia. The fascia was opened and the paraspinous musculature was taken down in  a subperiosteal fashion to expose L3-4 on the left. Intraoperative x-ray confirmed my level, and then I used a combination of the high-speed drill and the Kerrison punches to perform a redo hemilaminectomy, medial facetectomy, and foraminotomy at L3-4 on the left. I also drilled the lateral part of the pars and superior part of the facet to expose the extraforaminal space.The underlying yellow ligament was opened and removed in a piecemeal fashion to expose the underlying exiting L3nerve root. I also widen the hemilaminectomy and medial facetectomy at L3-4 on the left, D tethered the scar tissue from the lateral edge of the dura and bone, identified the pedicle and then marched superiorly along the lateral recess. I undercut the lateral recess and dissected down until I was medial to and distal to the pedicle. The nerve root was well decompressed. We then gently retracted the nerve root medially with a retractor, coagulated the epidural venous vasculature, and incised the disc space. I performed a thorough intradiscal discectomy with pituitary rongeurs and curettes, until I had a nice decompression of the nerve root and the midline. I then palpated with a coronary dilator along the nerve root and into the foramen to assure adequate decompression. I felt no more compression of the nerve root. I also identified the L3 nerve root in the extraforaminal space and worked inferior to the nerve root in the axilla the nerve root. I was able to remove more fragments of disc from the extraforaminal space. I was able to palpate between the extraforaminal and intraforaminal space with a coronary dilator. Dr. Jordan LikesPool helped with retraction of the nerve root and also with the discectomy. Once the discectomy was complete we could palpate with a coronary dilator along the L3 and the L4  nerve root. Her disc was quite soft and degenerated and therefore a maximal discectomy was necessary. I irrigated with saline solution containing  bacitracin. Achieved hemostasis with bipolar cautery, lined the dura with Gelfoam, and then closed the fascia with 0 Vicryl. I closed the subcutaneous tissues with 2-0 Vicryl and the subcuticular tissues with 3-0 Vicryl. The skin was then closed with benzoin and Steri-Strips. The drapes were removed, a sterile dressing was applied. The patient was awakened from general anesthesia and transferred to the recovery room in stable condition. At the end of the procedure all sponge, needle and instrument counts were correct.   PLAN OF CARE: Admit for overnight observation  PATIENT DISPOSITION:  PACU - hemodynamically stable.   Delay start of Pharmacological VTE agent (>24hrs) due to surgical blood loss or risk of bleeding:  yes

## 2014-11-03 NOTE — Progress Notes (Signed)
Pt states that she took 4-5 oz of sierra mist. This am 0300 with meds.  Pt also took 15units of Lantuss and glucotrol po. Dr Walden FieldManey called and informed

## 2014-11-03 NOTE — Plan of Care (Signed)
Problem: Phase III Progression Outcomes Goal: Pain controlled on oral analgesia Outcome: Progressing Goal: Activity at appropriate level-compared to baseline (UP IN CHAIR FOR HEMODIALYSIS)  Outcome: Progressing Goal: Demonstrates proper use of assistive devices Outcome: Progressing Goal: Discharge plan remains appropriate-arrangements made Outcome: Progressing

## 2014-11-03 NOTE — Progress Notes (Signed)
ANTIBIOTIC CONSULT NOTE - INITIAL  Pharmacy Consult for vancomycin Indication: surgical prophylaxis  Allergies  Allergen Reactions  . Ceclor [Cefaclor] Shortness Of Breath, Itching and Swelling  . Lyrica [Pregabalin] Other (See Comments)    "causes me to be very depressed."  . Tape     "tears my skin."    Patient Measurements: Height: 5' 5.5" (166.4 cm) Weight: 277 lb (125.646 kg) IBW/kg (Calculated) : 58.15 Adjusted Body Weight:   Vital Signs: Temp: 97.8 F (36.6 C) (11/06 1218) Temp Source: Oral (11/06 40980632) BP: 133/75 mmHg (11/06 1045) Pulse Rate: 98 (11/06 1218) Intake/Output from previous day:   Intake/Output from this shift: Total I/O In: 1740 [P.O.:240; I.V.:1500] Out: 50 [Blood:50]  Labs: No results for input(s): WBC, HGB, PLT, LABCREA, CREATININE in the last 72 hours. Estimated Creatinine Clearance: 78.2 mL/min (by C-G formula based on Cr of 0.99). No results for input(s): VANCOTROUGH, VANCOPEAK, VANCORANDOM, GENTTROUGH, GENTPEAK, GENTRANDOM, TOBRATROUGH, TOBRAPEAK, TOBRARND, AMIKACINPEAK, AMIKACINTROU, AMIKACIN in the last 72 hours.   Microbiology: Recent Results (from the past 720 hour(s))  Surgical pcr screen     Status: Abnormal   Collection Time: 10/30/14  3:55 PM  Result Value Ref Range Status   MRSA, PCR NEGATIVE NEGATIVE Final   Staphylococcus aureus POSITIVE (A) NEGATIVE Final    Comment:        The Xpert SA Assay (FDA approved for NASAL specimens in patients over 63 years of age), is one component of a comprehensive surveillance program.  Test performance has been validated by [facility] for patients greater than or equal to 63 year old. It is not intended to diagnose infection nor to guide or monitor treatment.     Medical History: Past Medical History  Diagnosis Date  . Family history of adverse reaction to anesthesia     mother woke up during surgery  . Hypertension   . Dysrhythmia   . Coronary artery disease   . COPD  (chronic obstructive pulmonary disease)   . Sleep apnea     uses CPAP  . Asthma   . Hypothyroidism   . Anxiety   . Depression   . Headache   . Arthritis   . Fibromyalgia   . Anemia   . Diabetes mellitus without complication     Medications:  Scheduled:  . aspirin EC  81 mg Oral Daily  . busPIRone  15-30 mg Oral BID  . celecoxib  200 mg Oral Daily  . estradiol  1 mg Oral Daily  . fentaNYL      . furosemide  20 mg Oral Daily  . gabapentin  300 mg Oral TID  . gemfibrozil  600 mg Oral BID AC  . glipiZIDE  10 mg Oral BID AC  . [START ON 11/04/2014] levothyroxine  100 mcg Oral QAC breakfast  . lisinopril  10 mg Oral Daily  . metoprolol tartrate  25 mg Oral q morning - 10a  . montelukast  10 mg Oral q morning - 10a  . phenytoin  100 mg Oral TID  . senna  1 tablet Oral BID  . sodium chloride  3 mL Intravenous Q12H  . topiramate  50 mg Oral BID  . traZODone  150 mg Oral QHS  . triamterene-hydrochlorothiazide  1 capsule Oral QPM  . venlafaxine  225 mg Oral q morning - 10a   Infusions:  . 0.9 % NaCl with KCl 20 mEq / L     Assessment: 63 yo female s/p neurosurgery will be put on  vancomycin for surgical prophylaxis.  Per RN, patient doesn't have a drain.  Patient received one dose of vancomycin 1500 mg iv x1 at 0726.  CrCl 78.2  Goal of Therapy:  Vancomycin trough level 10-15 mcg/ml  Plan:  - vancomycin 1g iv x1 at 1930. Pharmacy will sign off.   Yeraldine Forney, Tsz-Yin 11/03/2014,12:34 PM

## 2014-11-04 DIAGNOSIS — M5116 Intervertebral disc disorders with radiculopathy, lumbar region: Secondary | ICD-10-CM | POA: Diagnosis not present

## 2014-11-04 LAB — GLUCOSE, CAPILLARY: GLUCOSE-CAPILLARY: 154 mg/dL — AB (ref 70–99)

## 2014-11-04 MED ORDER — CYCLOBENZAPRINE HCL 10 MG PO TABS: 5.0000 mg | ORAL_TABLET | Freq: Three times a day (TID) | ORAL | Status: DC | PRN

## 2014-11-04 MED ORDER — HYDROCODONE-ACETAMINOPHEN 10-325 MG PO TABS: 1.0000 | ORAL_TABLET | Freq: Every day | ORAL | Status: DC | PRN

## 2014-11-04 NOTE — Discharge Summary (Signed)
Physician Discharge Summary  Patient ID: Jenna Butler MRN: [id number] DOB/AGE: 01-Nov-1951 63 y.o.  Admit date: 11/03/2014 Discharge date: 11/04/2014  Admission Diagnoses: recurrent lumbar HNP    Discharge Diagnoses: same   Discharged Condition: good  Hospital Course: The patient was admitted on 11/03/2014 and taken to the operating room where the patient underwent re-do microdiskectomy. The patient tolerated the procedure well and was taken to the recovery room and then to the floor in stable condition. The hospital course was routine. There were no complications. The wound remained clean dry and intact. Pt had appropriate back soreness. No complaints of leg pain or new N/T/W. The patient remained afebrile with stable vital signs, and tolerated a regular diet. The patient continued to increase activities, and pain was well controlled with oral pain medications.   Consults: None  Significant Diagnostic Studies:  Results for orders placed or performed during the hospital encounter of 11/03/14  Glucose, capillary  Result Value Ref Range   Glucose-Capillary 128 (H) 70 - 99 mg/dL  Glucose, capillary  Result Value Ref Range   Glucose-Capillary 209 (H) 70 - 99 mg/dL  Glucose, capillary  Result Value Ref Range   Glucose-Capillary 231 (H) 70 - 99 mg/dL   Comment 1 Documented in Chart   Glucose, capillary  Result Value Ref Range   Glucose-Capillary 180 (H) 70 - 99 mg/dL    Chest 2 View  24/03/101   CLINICAL DATA:  Preop lumbar spine surgery.  EXAM: CHEST  2 VIEW  COMPARISON:  None.  FINDINGS: Trachea is midline. Heart size normal. Lungs are clear. No pleural fluid. Mild degenerative changes are seen in the spine.  IMPRESSION: No acute findings.   Electronically Signed   By: Leanna Battles M.D.   On: 10/30/2014 16:57   Dg Lumbar Spine 2-3 Views  11/03/2014   CLINICAL DATA:  Lumbar spinal stenosis.  EXAM: LUMBAR SPINE - 2-3 VIEW  COMPARISON:  MRI scan of October 05, 2014.  FINDINGS: Three  lateral intraoperative projections of the lumbar spine were obtained. These images demonstrate surgical probe directed into L3-4 disc space posteriorly.  IMPRESSION: Surgical localization of L3-4 disc space.   Electronically Signed   By: Roque Lias M.D.   On: 11/03/2014 09:13    Antibiotics:  Anti-infectives    Start     Dose/Rate Route Frequency Ordered Stop   11/03/14 1930  vancomycin (VANCOCIN) IVPB 1000 mg/200 mL premix     1,000 mg200 mL/hr over 60 Minutes Intravenous  Once 11/03/14 1236 11/03/14 2047   11/03/14 0818  bacitracin 50,000 Units in sodium chloride irrigation 0.9 % 500 mL irrigation  Status:  Discontinued       As needed 11/03/14 0818 11/03/14 1004   11/03/14 0600  ceFAZolin (ANCEF) IVPB 2 g/50 mL premix  Status:  Discontinued     2 g100 mL/hr over 30 Minutes Intravenous On call to O.R. 11/02/14 1414 11/02/14 1416   11/03/14 0600  vancomycin (VANCOCIN) 1,500 mg in sodium chloride 0.9 % 500 mL IVPB     1,500 mg250 mL/hr over 120 Minutes Intravenous On call to O.R. 11/02/14 1528 11/03/14 0926      Discharge Exam: Blood pressure 102/51, pulse 90, temperature 98.2 F (36.8 C), temperature source Oral, resp. rate 20, height 5' 5.5" (1.664 m), weight 277 lb (125.646 kg), SpO2 98 %. Neurologic: Grossly normal Incision CDI  Discharge Medications:     Medication List    TAKE these medications  albuterol 108 (90 BASE) MCG/ACT inhaler  Commonly known as:  PROVENTIL HFA;VENTOLIN HFA  Inhale 2 puffs into the lungs every 6 (six) hours as needed for wheezing or shortness of breath.     aspirin EC 81 MG tablet  Take 81 mg by mouth daily.     busPIRone 15 MG tablet  Commonly known as:  BUSPAR  Take 15-30 mg by mouth 2 (two) times daily. Take 15 mg in the morning and 30 mg in the evening.     celecoxib 200 MG capsule  Commonly known as:  CELEBREX  Take 200 mg by mouth daily.     clonazePAM 0.5 MG tablet  Commonly known as:  KLONOPIN  Take 0.5 mg by mouth 3 (three)  times daily as needed for anxiety.     cyclobenzaprine 10 MG tablet  Commonly known as:  FLEXERIL  Take 0.5-1 tablets (5-10 mg total) by mouth 3 (three) times daily as needed for muscle spasms.     estradiol 1 MG tablet  Commonly known as:  ESTRACE  Take 1 mg by mouth daily.     furosemide 20 MG tablet  Commonly known as:  LASIX  Take 20 mg by mouth daily.     gabapentin 300 MG capsule  Commonly known as:  NEURONTIN  Take 300 mg by mouth 3 (three) times daily.     gemfibrozil 600 MG tablet  Commonly known as:  LOPID  Take 600 mg by mouth 2 (two) times daily before a meal.     glipiZIDE 10 MG tablet  Commonly known as:  GLUCOTROL  Take 10 mg by mouth 2 (two) times daily before a meal.     glucose 4 GM chewable tablet  Chew 1 tablet by mouth as needed for low blood sugar.     HYDROcodone-acetaminophen 10-325 MG per tablet  Commonly known as:  NORCO  Take 1 tablet by mouth daily as needed for severe pain.     insulin glargine 100 UNIT/ML injection  Commonly known as:  LANTUS  Inject 21 Units into the skin at bedtime.     levothyroxine 100 MCG tablet  Commonly known as:  SYNTHROID, LEVOTHROID  Take 100 mcg by mouth daily before breakfast.     lisinopril 20 MG tablet  Commonly known as:  PRINIVIL,ZESTRIL  Take 10 mg by mouth daily.     metoprolol tartrate 25 MG tablet  Commonly known as:  LOPRESSOR  Take 25 mg by mouth every morning.     montelukast 10 MG tablet  Commonly known as:  SINGULAIR  Take 10 mg by mouth every morning.     phenytoin 100 MG ER capsule  Commonly known as:  DILANTIN  Take 100 mg by mouth 3 (three) times daily.     topiramate 50 MG tablet  Commonly known as:  TOPAMAX  Take 50 mg by mouth 2 (two) times daily.     traZODone 150 MG tablet  Commonly known as:  DESYREL  Take 150 mg by mouth at bedtime.     triamterene-hydrochlorothiazide 37.5-25 MG per capsule  Commonly known as:  DYAZIDE  Take 1 capsule by mouth every evening.      venlafaxine 75 MG tablet  Commonly known as:  EFFEXOR  Take 225 mg by mouth daily.     venlafaxine XR 75 MG 24 hr capsule  Commonly known as:  EFFEXOR-XR  Take 225 mg by mouth daily with breakfast.        Disposition: home  Final Dx: re-do microdiskectomy L3-4 L      Discharge Instructions     Remove dressing in 72 hours    Complete by:  As directed      Call MD for:  difficulty breathing, headache or visual disturbances    Complete by:  As directed      Call MD for:  persistant nausea and vomiting    Complete by:  As directed      Call MD for:  redness, tenderness, or signs of infection (pain, swelling, redness, odor or green/yellow discharge around incision site)    Complete by:  As directed      Call MD for:  severe uncontrolled pain    Complete by:  As directed      Call MD for:  temperature >100.4    Complete by:  As directed      Diet - low sodium heart healthy    Complete by:  As directed      Discharge instructions    Complete by:  As directed   No strenuous activity, no bending or twisting, no driving, no heavy lifting     Increase activity slowly    Complete by:  As directed            Follow-up Information    Follow up with Fabrice Dyal S, MD In 2 weeks.   Specialty:  Neurosurgery   Contact information:   [address] [place] [address] [phone]        Signed: Tia AlertJONES,Dashawn Bartnick S 11/04/2014, 7:52 AM

## 2014-11-04 NOTE — Progress Notes (Signed)
Patient alert and oriented, mae's well, voiding adequate amount of urine, swallowing without difficulty, c/o pain and medication given prior to discharged. Patient discharged home with family. Script and discharged instructions given to patient. Patient and family stated understanding of d/c instructions given and has an appointment with MD. Marin RobertsAisha Chelcy Bolda RN.

## 2014-11-07 ENCOUNTER — Encounter (HOSPITAL_COMMUNITY): Payer: Self-pay | Admitting: Neurological Surgery

## 2016-01-24 DIAGNOSIS — E039 Hypothyroidism, unspecified: Secondary | ICD-10-CM | POA: Diagnosis not present

## 2016-01-24 DIAGNOSIS — D519 Vitamin B12 deficiency anemia, unspecified: Secondary | ICD-10-CM | POA: Diagnosis not present

## 2016-01-24 DIAGNOSIS — M2550 Pain in unspecified joint: Secondary | ICD-10-CM | POA: Diagnosis not present

## 2016-01-24 DIAGNOSIS — K121 Other forms of stomatitis: Secondary | ICD-10-CM | POA: Diagnosis not present

## 2016-01-24 DIAGNOSIS — E785 Hyperlipidemia, unspecified: Secondary | ICD-10-CM | POA: Diagnosis not present

## 2016-01-24 DIAGNOSIS — E113291 Type 2 diabetes mellitus with mild nonproliferative diabetic retinopathy without macular edema, right eye: Secondary | ICD-10-CM | POA: Diagnosis not present

## 2016-01-24 DIAGNOSIS — R5383 Other fatigue: Secondary | ICD-10-CM | POA: Diagnosis not present

## 2016-01-24 DIAGNOSIS — R6889 Other general symptoms and signs: Secondary | ICD-10-CM | POA: Diagnosis not present

## 2016-01-24 DIAGNOSIS — Z862 Personal history of diseases of the blood and blood-forming organs and certain disorders involving the immune mechanism: Secondary | ICD-10-CM | POA: Diagnosis not present

## 2016-01-24 DIAGNOSIS — E1140 Type 2 diabetes mellitus with diabetic neuropathy, unspecified: Secondary | ICD-10-CM | POA: Diagnosis not present

## 2016-01-24 DIAGNOSIS — I10 Essential (primary) hypertension: Secondary | ICD-10-CM | POA: Diagnosis not present

## 2016-01-24 DIAGNOSIS — N946 Dysmenorrhea, unspecified: Secondary | ICD-10-CM | POA: Diagnosis not present

## 2016-01-30 DIAGNOSIS — G4733 Obstructive sleep apnea (adult) (pediatric): Secondary | ICD-10-CM | POA: Diagnosis not present

## 2016-02-11 DIAGNOSIS — F4310 Post-traumatic stress disorder, unspecified: Secondary | ICD-10-CM | POA: Diagnosis not present

## 2016-02-11 DIAGNOSIS — G4700 Insomnia, unspecified: Secondary | ICD-10-CM | POA: Diagnosis not present

## 2016-02-11 DIAGNOSIS — F331 Major depressive disorder, recurrent, moderate: Secondary | ICD-10-CM | POA: Diagnosis not present

## 2016-02-11 DIAGNOSIS — F332 Major depressive disorder, recurrent severe without psychotic features: Secondary | ICD-10-CM | POA: Diagnosis not present

## 2016-03-13 DIAGNOSIS — M5417 Radiculopathy, lumbosacral region: Secondary | ICD-10-CM | POA: Diagnosis not present

## 2016-03-13 DIAGNOSIS — G5603 Carpal tunnel syndrome, bilateral upper limbs: Secondary | ICD-10-CM | POA: Diagnosis not present

## 2016-03-13 DIAGNOSIS — M5412 Radiculopathy, cervical region: Secondary | ICD-10-CM | POA: Diagnosis not present

## 2016-03-13 DIAGNOSIS — G603 Idiopathic progressive neuropathy: Secondary | ICD-10-CM | POA: Diagnosis not present

## 2016-03-13 DIAGNOSIS — M5481 Occipital neuralgia: Secondary | ICD-10-CM | POA: Diagnosis not present

## 2016-03-28 DIAGNOSIS — B349 Viral infection, unspecified: Secondary | ICD-10-CM | POA: Diagnosis not present

## 2016-03-28 DIAGNOSIS — J029 Acute pharyngitis, unspecified: Secondary | ICD-10-CM | POA: Diagnosis not present

## 2016-03-28 DIAGNOSIS — K120 Recurrent oral aphthae: Secondary | ICD-10-CM | POA: Diagnosis not present

## 2016-03-28 DIAGNOSIS — R591 Generalized enlarged lymph nodes: Secondary | ICD-10-CM | POA: Diagnosis not present

## 2016-03-28 DIAGNOSIS — R5381 Other malaise: Secondary | ICD-10-CM | POA: Diagnosis not present

## 2016-03-31 DIAGNOSIS — I10 Essential (primary) hypertension: Secondary | ICD-10-CM | POA: Diagnosis not present

## 2016-03-31 DIAGNOSIS — E113291 Type 2 diabetes mellitus with mild nonproliferative diabetic retinopathy without macular edema, right eye: Secondary | ICD-10-CM | POA: Diagnosis not present

## 2016-03-31 DIAGNOSIS — E785 Hyperlipidemia, unspecified: Secondary | ICD-10-CM | POA: Diagnosis not present

## 2016-03-31 DIAGNOSIS — E1140 Type 2 diabetes mellitus with diabetic neuropathy, unspecified: Secondary | ICD-10-CM | POA: Diagnosis not present

## 2016-03-31 DIAGNOSIS — E039 Hypothyroidism, unspecified: Secondary | ICD-10-CM | POA: Diagnosis not present

## 2016-03-31 DIAGNOSIS — M2550 Pain in unspecified joint: Secondary | ICD-10-CM | POA: Diagnosis not present

## 2016-04-10 DIAGNOSIS — R2689 Other abnormalities of gait and mobility: Secondary | ICD-10-CM | POA: Diagnosis not present

## 2016-04-10 DIAGNOSIS — R296 Repeated falls: Secondary | ICD-10-CM | POA: Diagnosis not present

## 2016-04-10 DIAGNOSIS — R51 Headache: Secondary | ICD-10-CM | POA: Diagnosis not present

## 2016-04-14 DIAGNOSIS — G4733 Obstructive sleep apnea (adult) (pediatric): Secondary | ICD-10-CM | POA: Diagnosis not present

## 2016-04-14 DIAGNOSIS — G4761 Periodic limb movement disorder: Secondary | ICD-10-CM | POA: Diagnosis not present

## 2016-04-23 DIAGNOSIS — G4734 Idiopathic sleep related nonobstructive alveolar hypoventilation: Secondary | ICD-10-CM | POA: Diagnosis not present

## 2016-04-23 DIAGNOSIS — G4733 Obstructive sleep apnea (adult) (pediatric): Secondary | ICD-10-CM | POA: Diagnosis not present

## 2016-04-24 DIAGNOSIS — M5412 Radiculopathy, cervical region: Secondary | ICD-10-CM | POA: Diagnosis not present

## 2016-04-24 DIAGNOSIS — G603 Idiopathic progressive neuropathy: Secondary | ICD-10-CM | POA: Diagnosis not present

## 2016-04-24 DIAGNOSIS — M5417 Radiculopathy, lumbosacral region: Secondary | ICD-10-CM | POA: Diagnosis not present

## 2016-04-24 DIAGNOSIS — E538 Deficiency of other specified B group vitamins: Secondary | ICD-10-CM | POA: Diagnosis not present

## 2016-04-24 DIAGNOSIS — R410 Disorientation, unspecified: Secondary | ICD-10-CM | POA: Diagnosis not present

## 2016-04-24 DIAGNOSIS — G5603 Carpal tunnel syndrome, bilateral upper limbs: Secondary | ICD-10-CM | POA: Diagnosis not present

## 2016-04-24 DIAGNOSIS — M5481 Occipital neuralgia: Secondary | ICD-10-CM | POA: Diagnosis not present

## 2016-04-24 DIAGNOSIS — G5623 Lesion of ulnar nerve, bilateral upper limbs: Secondary | ICD-10-CM | POA: Diagnosis not present

## 2016-04-24 DIAGNOSIS — G43009 Migraine without aura, not intractable, without status migrainosus: Secondary | ICD-10-CM | POA: Diagnosis not present

## 2016-05-05 ENCOUNTER — Other Ambulatory Visit: Payer: Self-pay | Admitting: Family Medicine

## 2016-05-05 ENCOUNTER — Ambulatory Visit (HOSPITAL_BASED_OUTPATIENT_CLINIC_OR_DEPARTMENT_OTHER)
Admission: RE | Admit: 2016-05-05 | Discharge: 2016-05-05 | Disposition: A | Discharge status: Home / Self Care | Payer: Medicare Other | Source: Ambulatory Visit | Attending: Family Medicine | Admitting: Family Medicine

## 2016-05-05 DIAGNOSIS — R609 Edema, unspecified: Secondary | ICD-10-CM | POA: Insufficient documentation

## 2016-05-05 DIAGNOSIS — M79605 Pain in left leg: Secondary | ICD-10-CM | POA: Diagnosis not present

## 2016-05-05 DIAGNOSIS — L539 Erythematous condition, unspecified: Secondary | ICD-10-CM | POA: Diagnosis not present

## 2016-05-05 DIAGNOSIS — M7989 Other specified soft tissue disorders: Secondary | ICD-10-CM | POA: Diagnosis not present

## 2016-05-05 DIAGNOSIS — M79672 Pain in left foot: Secondary | ICD-10-CM | POA: Diagnosis not present

## 2016-05-14 DIAGNOSIS — L239 Allergic contact dermatitis, unspecified cause: Secondary | ICD-10-CM | POA: Diagnosis not present

## 2016-05-23 DIAGNOSIS — E1140 Type 2 diabetes mellitus with diabetic neuropathy, unspecified: Secondary | ICD-10-CM | POA: Diagnosis not present

## 2016-05-23 DIAGNOSIS — E039 Hypothyroidism, unspecified: Secondary | ICD-10-CM | POA: Diagnosis not present

## 2016-05-23 DIAGNOSIS — Z23 Encounter for immunization: Secondary | ICD-10-CM | POA: Diagnosis not present

## 2016-05-23 DIAGNOSIS — G4730 Sleep apnea, unspecified: Secondary | ICD-10-CM | POA: Diagnosis not present

## 2016-05-23 DIAGNOSIS — G43909 Migraine, unspecified, not intractable, without status migrainosus: Secondary | ICD-10-CM | POA: Diagnosis not present

## 2016-05-23 DIAGNOSIS — E1169 Type 2 diabetes mellitus with other specified complication: Secondary | ICD-10-CM | POA: Diagnosis not present

## 2016-05-23 DIAGNOSIS — I10 Essential (primary) hypertension: Secondary | ICD-10-CM | POA: Diagnosis not present

## 2016-05-23 DIAGNOSIS — E538 Deficiency of other specified B group vitamins: Secondary | ICD-10-CM | POA: Diagnosis not present

## 2016-05-23 DIAGNOSIS — E785 Hyperlipidemia, unspecified: Secondary | ICD-10-CM | POA: Diagnosis not present

## 2016-06-02 DIAGNOSIS — G4733 Obstructive sleep apnea (adult) (pediatric): Secondary | ICD-10-CM | POA: Diagnosis not present

## 2016-06-05 DIAGNOSIS — M5441 Lumbago with sciatica, right side: Secondary | ICD-10-CM | POA: Diagnosis not present

## 2016-06-05 DIAGNOSIS — G603 Idiopathic progressive neuropathy: Secondary | ICD-10-CM | POA: Diagnosis not present

## 2016-06-05 DIAGNOSIS — G5602 Carpal tunnel syndrome, left upper limb: Secondary | ICD-10-CM | POA: Diagnosis not present

## 2016-06-05 DIAGNOSIS — M5412 Radiculopathy, cervical region: Secondary | ICD-10-CM | POA: Diagnosis not present

## 2016-06-05 DIAGNOSIS — G5601 Carpal tunnel syndrome, right upper limb: Secondary | ICD-10-CM | POA: Diagnosis not present

## 2016-06-05 DIAGNOSIS — M5442 Lumbago with sciatica, left side: Secondary | ICD-10-CM | POA: Diagnosis not present

## 2016-06-25 DIAGNOSIS — E538 Deficiency of other specified B group vitamins: Secondary | ICD-10-CM | POA: Diagnosis not present

## 2016-07-04 DIAGNOSIS — G4700 Insomnia, unspecified: Secondary | ICD-10-CM | POA: Diagnosis not present

## 2016-07-04 DIAGNOSIS — F4310 Post-traumatic stress disorder, unspecified: Secondary | ICD-10-CM | POA: Diagnosis not present

## 2016-07-04 DIAGNOSIS — F331 Major depressive disorder, recurrent, moderate: Secondary | ICD-10-CM | POA: Diagnosis not present

## 2016-07-29 DIAGNOSIS — L0390 Cellulitis, unspecified: Secondary | ICD-10-CM | POA: Diagnosis not present

## 2016-08-06 DIAGNOSIS — M25551 Pain in right hip: Secondary | ICD-10-CM | POA: Diagnosis not present

## 2016-08-06 DIAGNOSIS — M5412 Radiculopathy, cervical region: Secondary | ICD-10-CM | POA: Diagnosis not present

## 2016-08-06 DIAGNOSIS — M5442 Lumbago with sciatica, left side: Secondary | ICD-10-CM | POA: Diagnosis not present

## 2016-08-06 DIAGNOSIS — M5441 Lumbago with sciatica, right side: Secondary | ICD-10-CM | POA: Diagnosis not present

## 2016-08-06 DIAGNOSIS — G43009 Migraine without aura, not intractable, without status migrainosus: Secondary | ICD-10-CM | POA: Diagnosis not present

## 2016-08-06 DIAGNOSIS — G603 Idiopathic progressive neuropathy: Secondary | ICD-10-CM | POA: Diagnosis not present

## 2016-08-30 DIAGNOSIS — Z87891 Personal history of nicotine dependence: Secondary | ICD-10-CM | POA: Diagnosis not present

## 2016-08-30 DIAGNOSIS — K589 Irritable bowel syndrome without diarrhea: Secondary | ICD-10-CM | POA: Diagnosis not present

## 2016-08-30 DIAGNOSIS — Z7982 Long term (current) use of aspirin: Secondary | ICD-10-CM | POA: Diagnosis not present

## 2016-08-30 DIAGNOSIS — Z881 Allergy status to other antibiotic agents status: Secondary | ICD-10-CM | POA: Diagnosis not present

## 2016-08-30 DIAGNOSIS — M5416 Radiculopathy, lumbar region: Secondary | ICD-10-CM | POA: Diagnosis not present

## 2016-08-30 DIAGNOSIS — K219 Gastro-esophageal reflux disease without esophagitis: Secondary | ICD-10-CM | POA: Diagnosis not present

## 2016-08-30 DIAGNOSIS — Z888 Allergy status to other drugs, medicaments and biological substances status: Secondary | ICD-10-CM | POA: Diagnosis not present

## 2016-08-30 DIAGNOSIS — I2510 Atherosclerotic heart disease of native coronary artery without angina pectoris: Secondary | ICD-10-CM | POA: Diagnosis not present

## 2016-08-30 DIAGNOSIS — Z794 Long term (current) use of insulin: Secondary | ICD-10-CM | POA: Diagnosis not present

## 2016-08-30 DIAGNOSIS — Z880 Allergy status to penicillin: Secondary | ICD-10-CM | POA: Diagnosis not present

## 2016-08-30 DIAGNOSIS — E785 Hyperlipidemia, unspecified: Secondary | ICD-10-CM | POA: Diagnosis not present

## 2016-08-30 DIAGNOSIS — M25551 Pain in right hip: Secondary | ICD-10-CM | POA: Diagnosis not present

## 2016-08-30 DIAGNOSIS — J449 Chronic obstructive pulmonary disease, unspecified: Secondary | ICD-10-CM | POA: Diagnosis not present

## 2016-08-30 DIAGNOSIS — E11618 Type 2 diabetes mellitus with other diabetic arthropathy: Secondary | ICD-10-CM | POA: Diagnosis not present

## 2016-08-30 DIAGNOSIS — Z79899 Other long term (current) drug therapy: Secondary | ICD-10-CM | POA: Diagnosis not present

## 2016-08-30 DIAGNOSIS — I10 Essential (primary) hypertension: Secondary | ICD-10-CM | POA: Diagnosis not present

## 2016-08-30 DIAGNOSIS — Z91048 Other nonmedicinal substance allergy status: Secondary | ICD-10-CM | POA: Diagnosis not present

## 2016-08-30 DIAGNOSIS — M545 Low back pain: Secondary | ICD-10-CM | POA: Diagnosis not present

## 2016-08-30 DIAGNOSIS — S3992XA Unspecified injury of lower back, initial encounter: Secondary | ICD-10-CM | POA: Diagnosis not present

## 2016-08-30 DIAGNOSIS — G4733 Obstructive sleep apnea (adult) (pediatric): Secondary | ICD-10-CM | POA: Diagnosis not present

## 2016-08-30 DIAGNOSIS — Z79890 Hormone replacement therapy (postmenopausal): Secondary | ICD-10-CM | POA: Diagnosis not present

## 2016-08-30 DIAGNOSIS — M25552 Pain in left hip: Secondary | ICD-10-CM | POA: Diagnosis not present

## 2016-09-16 DIAGNOSIS — Z6841 Body Mass Index (BMI) 40.0 and over, adult: Secondary | ICD-10-CM | POA: Diagnosis not present

## 2016-09-16 DIAGNOSIS — M545 Low back pain: Secondary | ICD-10-CM | POA: Diagnosis not present

## 2016-09-18 ENCOUNTER — Other Ambulatory Visit: Payer: Self-pay | Admitting: Neurological Surgery

## 2016-09-18 DIAGNOSIS — M545 Low back pain, unspecified: Secondary | ICD-10-CM

## 2016-09-23 DIAGNOSIS — M5136 Other intervertebral disc degeneration, lumbar region: Secondary | ICD-10-CM | POA: Diagnosis not present

## 2016-09-23 DIAGNOSIS — M545 Low back pain: Secondary | ICD-10-CM | POA: Diagnosis not present

## 2016-09-23 DIAGNOSIS — M47816 Spondylosis without myelopathy or radiculopathy, lumbar region: Secondary | ICD-10-CM | POA: Diagnosis not present

## 2016-09-30 ENCOUNTER — Other Ambulatory Visit: Payer: Self-pay | Admitting: Neurological Surgery

## 2016-09-30 ENCOUNTER — Ambulatory Visit (INDEPENDENT_AMBULATORY_CARE_PROVIDER_SITE_OTHER): Payer: Medicare Other

## 2016-09-30 DIAGNOSIS — I700 Atherosclerosis of aorta: Secondary | ICD-10-CM | POA: Diagnosis not present

## 2016-09-30 DIAGNOSIS — M545 Low back pain, unspecified: Secondary | ICD-10-CM

## 2016-09-30 DIAGNOSIS — M4317 Spondylolisthesis, lumbosacral region: Secondary | ICD-10-CM | POA: Diagnosis not present

## 2016-09-30 DIAGNOSIS — M48062 Spinal stenosis, lumbar region with neurogenic claudication: Secondary | ICD-10-CM | POA: Diagnosis not present

## 2016-09-30 DIAGNOSIS — Z6841 Body Mass Index (BMI) 40.0 and over, adult: Secondary | ICD-10-CM | POA: Diagnosis not present

## 2016-10-02 DIAGNOSIS — R928 Other abnormal and inconclusive findings on diagnostic imaging of breast: Secondary | ICD-10-CM | POA: Diagnosis not present

## 2016-10-07 DIAGNOSIS — I2510 Atherosclerotic heart disease of native coronary artery without angina pectoris: Secondary | ICD-10-CM | POA: Diagnosis not present

## 2016-10-07 DIAGNOSIS — I10 Essential (primary) hypertension: Secondary | ICD-10-CM | POA: Diagnosis not present

## 2016-10-15 DIAGNOSIS — I2510 Atherosclerotic heart disease of native coronary artery without angina pectoris: Secondary | ICD-10-CM | POA: Diagnosis not present

## 2016-10-21 ENCOUNTER — Other Ambulatory Visit: Payer: Self-pay | Admitting: Neurological Surgery

## 2016-10-23 DIAGNOSIS — Z23 Encounter for immunization: Secondary | ICD-10-CM | POA: Diagnosis not present

## 2016-10-28 DIAGNOSIS — E785 Hyperlipidemia, unspecified: Secondary | ICD-10-CM | POA: Diagnosis not present

## 2016-10-28 DIAGNOSIS — I10 Essential (primary) hypertension: Secondary | ICD-10-CM | POA: Diagnosis not present

## 2016-10-28 DIAGNOSIS — E039 Hypothyroidism, unspecified: Secondary | ICD-10-CM | POA: Diagnosis not present

## 2016-10-28 DIAGNOSIS — Z7901 Long term (current) use of anticoagulants: Secondary | ICD-10-CM | POA: Diagnosis not present

## 2016-10-28 DIAGNOSIS — E538 Deficiency of other specified B group vitamins: Secondary | ICD-10-CM | POA: Diagnosis not present

## 2016-10-28 DIAGNOSIS — T148XXA Other injury of unspecified body region, initial encounter: Secondary | ICD-10-CM | POA: Diagnosis not present

## 2016-10-28 DIAGNOSIS — E1169 Type 2 diabetes mellitus with other specified complication: Secondary | ICD-10-CM | POA: Diagnosis not present

## 2016-10-28 DIAGNOSIS — E559 Vitamin D deficiency, unspecified: Secondary | ICD-10-CM | POA: Diagnosis not present

## 2016-10-28 DIAGNOSIS — M4317 Spondylolisthesis, lumbosacral region: Secondary | ICD-10-CM | POA: Diagnosis not present

## 2016-10-28 DIAGNOSIS — Z1231 Encounter for screening mammogram for malignant neoplasm of breast: Secondary | ICD-10-CM | POA: Diagnosis not present

## 2016-10-28 DIAGNOSIS — Z0000 Encounter for general adult medical examination without abnormal findings: Secondary | ICD-10-CM | POA: Diagnosis not present

## 2016-10-28 DIAGNOSIS — Z6841 Body Mass Index (BMI) 40.0 and over, adult: Secondary | ICD-10-CM | POA: Diagnosis not present

## 2016-11-12 ENCOUNTER — Encounter (HOSPITAL_COMMUNITY)
Admission: RE | Admit: 2016-11-12 | Discharge: 2016-11-12 | Disposition: A | Discharge status: Home / Self Care | Payer: Medicare Other | Source: Ambulatory Visit | Attending: Neurological Surgery | Admitting: Neurological Surgery

## 2016-11-12 ENCOUNTER — Other Ambulatory Visit: Payer: Self-pay | Admitting: Neurological Surgery

## 2016-11-12 ENCOUNTER — Ambulatory Visit (HOSPITAL_COMMUNITY)
Admission: RE | Admit: 2016-11-12 | Discharge: 2016-11-12 | Disposition: A | Discharge status: Home / Self Care | Payer: Medicare Other | Source: Ambulatory Visit | Attending: Neurological Surgery | Admitting: Neurological Surgery

## 2016-11-12 ENCOUNTER — Encounter (HOSPITAL_COMMUNITY): Payer: Self-pay

## 2016-11-12 ENCOUNTER — Other Ambulatory Visit (HOSPITAL_COMMUNITY): Payer: Self-pay | Admitting: *Deleted

## 2016-11-12 DIAGNOSIS — M4310 Spondylolisthesis, site unspecified: Secondary | ICD-10-CM | POA: Insufficient documentation

## 2016-11-12 DIAGNOSIS — R05 Cough: Secondary | ICD-10-CM | POA: Diagnosis not present

## 2016-11-12 DIAGNOSIS — Z01810 Encounter for preprocedural cardiovascular examination: Secondary | ICD-10-CM | POA: Diagnosis not present

## 2016-11-12 DIAGNOSIS — Z01812 Encounter for preprocedural laboratory examination: Secondary | ICD-10-CM | POA: Diagnosis not present

## 2016-11-12 HISTORY — DX: Unspecified convulsions: R56.9

## 2016-11-12 HISTORY — DX: Restless legs syndrome: G25.81

## 2016-11-12 HISTORY — DX: Polyneuropathy, unspecified: G62.9

## 2016-11-12 LAB — CBC WITH DIFFERENTIAL/PLATELET
BASOS PCT: 1 %
Basophils Absolute: 0.1 10*3/uL (ref 0.0–0.1)
EOS ABS: 0.1 10*3/uL (ref 0.0–0.7)
EOS PCT: 1 %
HCT: 36.2 % (ref 36.0–46.0)
Hemoglobin: 11.7 g/dL — ABNORMAL LOW (ref 12.0–15.0)
LYMPHS ABS: 1.7 10*3/uL (ref 0.7–4.0)
Lymphocytes Relative: 18 %
MCH: 29.5 pg (ref 26.0–34.0)
MCHC: 32.3 g/dL (ref 30.0–36.0)
MCV: 91.2 fL (ref 78.0–100.0)
Monocytes Absolute: 0.8 10*3/uL (ref 0.1–1.0)
Monocytes Relative: 8 %
NEUTROS PCT: 72 %
Neutro Abs: 6.8 10*3/uL (ref 1.7–7.7)
PLATELETS: 253 10*3/uL (ref 150–400)
RBC: 3.97 MIL/uL (ref 3.87–5.11)
RDW: 15.4 % (ref 11.5–15.5)
WBC: 9.4 10*3/uL (ref 4.0–10.5)

## 2016-11-12 LAB — BASIC METABOLIC PANEL
Anion gap: 9 (ref 5–15)
BUN: 26 mg/dL — AB (ref 6–20)
CALCIUM: 10 mg/dL (ref 8.9–10.3)
CHLORIDE: 106 mmol/L (ref 101–111)
CO2: 27 mmol/L (ref 22–32)
CREATININE: 1.06 mg/dL — AB (ref 0.44–1.00)
GFR calc non Af Amer: 54 mL/min — ABNORMAL LOW (ref >60)
Glucose, Bld: 114 mg/dL — ABNORMAL HIGH (ref 65–99)
Potassium: 4.1 mmol/L (ref 3.5–5.1)
SODIUM: 142 mmol/L (ref 135–145)

## 2016-11-12 LAB — TYPE AND SCREEN
ABO/RH(D): O POS
ANTIBODY SCREEN: NEGATIVE

## 2016-11-12 LAB — SURGICAL PCR SCREEN
MRSA, PCR: NEGATIVE
STAPHYLOCOCCUS AUREUS: POSITIVE — AB

## 2016-11-12 LAB — GLUCOSE, CAPILLARY: Glucose-Capillary: 99 mg/dL (ref 65–99)

## 2016-11-12 LAB — PROTIME-INR
INR: 1.03
PROTHROMBIN TIME: 13.5 s (ref 11.4–15.2)

## 2016-11-12 LAB — ABO/RH: ABO/RH(D): O POS

## 2016-11-12 NOTE — Progress Notes (Signed)
Mupirocin Ointment Rx [facility] for positive PCR of Staph. Pt's daughter answered phone and results given to her and instructed daughter to have pt start using ointment tonight.

## 2016-11-12 NOTE — Pre-Procedure Instructions (Signed)
Jenna NegusLinda Butler  11/12/2016    Your procedure is scheduled on Monday, November 17, 2016 at 7:30 AM.   [facility] Admitting Office at 5:30 AM.   Call this number if you have problems the morning of surgery [phone]   Questions prior to day of surgery, please call [phone] between 8 & 4 PM.   Remember:  Do not eat food or drink liquids after midnight Sunday, 11/16/16.  Take these medicines the morning of surgery with A SIP OF WATER: Buspirone (Buspar), Gabapentin (Neurontin), Lamotrigine (Lamictal), Levothyroxine (Synthroid), Metoprolol (Toprol XL), Montelukast (Singulair), Phenytoin (Dilantin), Venlafaxine (Effexor), Oxycodone - if needed, Albuterol inhaler (Proventil) - if needed (bring inhaler with you day of surgery).  Stop Aspirin as of today. Do not use NSAIDS (Ibuprofen, Aleve, etc.) prior to surgery.   How to Manage Your Diabetes Before Surgery   Why is it important to control my blood sugar before and after surgery?   Improving blood sugar levels before and after surgery helps healing and can limit problems.  A way of improving blood sugar control is eating a healthy diet by:  - Eating less sugar and carbohydrates  - Increasing activity/exercise  - Talk with your doctor about reaching your blood sugar goals  High blood sugars (greater than 180 mg/dL) can raise your risk of infections and slow down your recovery so you will need to focus on controlling your diabetes during the weeks before surgery.  Make sure that the doctor who takes care of your diabetes knows about your planned surgery including the date and location.  How do I manage my blood sugars before surgery?   Check your blood sugar at least 4 times a day, 2 days before surgery to make sure that they are not too high or low.  Check your blood sugar the morning of your surgery when you wake up and every 2 hours until you get to the Short-Stay unit.  Treat a low blood  sugar (less than 70 mg/dL) with 1/2 cup of clear juice (cranberry or apple), 4 glucose tablets, OR glucose gel.  Recheck blood sugar in 15 minutes after treatment (to make sure it is greater than 70 mg/dL).  If blood sugar is not greater than 70 mg/dL on re-check, call [phone] for further instructions.   Report your blood sugar to the Short-Stay nurse when you get to Short-Stay.  References:  [facility], 2007 "How to Manage your Diabetes Before and After Surgery".  What do I do about my diabetes medications?   Do not take oral diabetes medicines (pills) the morning of surgery.  THE NIGHT BEFORE SURGERY, take 10 units of Lantus Insulin.   Do not wear jewelry, make-up or nail polish.  Do not wear lotions, powders, or perfumes.  Do not shave 48 hours prior to surgery.    Do not bring valuables to the hospital.  [facility] is not responsible for any belongings or valuables.  Contacts, dentures or bridgework may not be worn into surgery.  Leave your suitcase in the car.  After surgery it may be brought to your room.  For patients admitted to the hospital, discharge time will be determined by your treatment team.  Special instructions:  [place] - Preparing for Surgery  Before surgery, you can play an important role.  Because skin is not sterile, your skin needs to be as free of germs as possible.  You can reduce the number of  germs on you skin by washing with CHG (chlorahexidine gluconate) soap before surgery.  CHG is an antiseptic cleaner which kills germs and bonds with the skin to continue killing germs even after washing.  Please DO NOT use if you have an allergy to CHG or antibacterial soaps.  If your skin becomes reddened/irritated stop using the CHG and inform your nurse when you arrive at Short Stay.  Do not shave (including legs and underarms) for at least 48 hours prior to the first CHG shower.  You may shave your face.  Please follow these  instructions carefully:   1.  Shower with CHG Soap the night before surgery and the                    morning of Surgery.  2.  If you choose to wash your hair, wash your hair first as usual with your       normal shampoo.  3.  After you shampoo, rinse your hair and body thoroughly to remove the shampoo.  4.  Use CHG as you would any other liquid soap.  You can apply chg directly       to the skin and wash gently with scrungie or a clean washcloth.  5.  Apply the CHG Soap to your body ONLY FROM THE NECK DOWN.        Do not use on open wounds or open sores.  Avoid contact with your eyes, ears, mouth and genitals (private parts).  Wash genitals (private parts) with your normal soap.  6.  Wash thoroughly, paying special attention to the area where your surgery        will be performed.  7.  Thoroughly rinse your body with warm water from the neck down.  8.  DO NOT shower/wash with your normal soap after using and rinsing off       the CHG Soap.  9.  Pat yourself dry with a clean towel.            10.  Wear clean pajamas.            11.  Place clean sheets on your bed the night of your first shower and do not        sleep with pets.  Day of Surgery  Do not apply any lotions the morning of surgery.  Please wear clean clothes to the hospital.  Please read over the following fact sheets that you were given.

## 2016-11-12 NOTE — Progress Notes (Signed)
Pt has hx of CAD with one stent. Pt has cardiac clearance, note, stress test and EKG in chart. Pt is diabetic. Last A1C was 5.9 on 10/29/16. States fasting blood sugar is usually between 100-110. Today's cbg was 95.

## 2016-11-13 DIAGNOSIS — J069 Acute upper respiratory infection, unspecified: Secondary | ICD-10-CM | POA: Diagnosis not present

## 2016-11-13 NOTE — Progress Notes (Signed)
Anesthesia Chart Review: Patient is a 65 year old female scheduled for L3-4, L4-5 transforaminal lumbar interbody fusion on 11/17/16 by Dr. Yetta BarreJones.  History includes former smoker, HTN, CAD s/p LAD stent ~ '05 (s/p PCI to LAD 2008 mild disease elsewhere at that time according to cardiology notes), dysrhythmia (not specified), COPD, OSA (needs new CPAP; sees Dr. Tillie FantasiaJohn Chewning), asthma, depression, fibromyalgia, seizures, RLS, neuropathy, hypothyroidism, anxiety, anemia, hysterectomy, cholecystectomy, appendectomy, left L3-4 microdiscectomy with redo hemilaminectomy '15. For anesthesia history, she reported her mother woke up during a surgery. BMI is consistent with morbid obesity.   PCP is Dr. Harl Bowieathy Judge.  Cardiologist is Dr. Mickie HillierShannon Mitch [facility] [facility], last visit 10/07/16 (Care Everywhere). Dr. Alene MiresSt. Clair did not recommend additional pre-operative testing following a recent stress test.   Meds include albuterol, ASA 81 mg, Buspar, Klonipin, Flexeril, Lasix, Neurontin, Lopid, glipizide, Lantus, Lamictal, levothyroxine, lisinopril, Toprol XL, Singulair, Oxy IR, Dilantin, trazodone, Dyazide, Effexor XR.  BP 110/71   Pulse 64   Temp 36.7 C   Resp 20   Ht 5\' 6"  (1.676 m)   Wt 259 lb 12.8 oz (117.8 kg)   SpO2 96%   BMI 41.93 kg/m   10/07/16 EKG [facility]): Sinus rhythm, poor R-wave progression, may be secondary to pulmonary disease, consider old anterior infarct. Low voltage, possible pulmonary disease.   10/15/16 Nuclear stress test [facility]): Impression: There is moderate breast attenuation on rest and stress images. There is no evidence of reversible ischemia. Post stress EF 63%. Normal wall motion. No ECG changes.  04/25/13 Echo [facility]; scanned under Media tab, Correspondence 11/03/14): The study was technically difficult. Images of the previous study from 2001 unavailable for comparison. The left ventricle is normal in size, wall thickness and wall motion with  ejection fraction of 55-60%. Grade 1 mild diastolic dysfunction, abnormal relaxation pattern. Estimation of right ventricular systolic pressure is not possible. The aortic valve is not well visualized, but is grossly normal. Trace MR/TR/AR/PR.  11/12/16 CXR: IMPRESSION: No active cardiopulmonary disease.  Preoperative labs noted. Reportedly her last A1c was 5.9 on 11/1/7, requesting copy.   If no acute changes then I anticpate that she can proceed as planned.  Velna Ochsllison Aurelia Gras, PA-C [facility]/Anesthesiology Phone [phone] 11/13/2016 3:26 PM

## 2016-11-14 MED ORDER — VANCOMYCIN HCL 10 G IV SOLR
1500.0000 mg | INTRAVENOUS | Status: AC
  Filled 2016-11-14: qty 1500

## 2016-11-16 DIAGNOSIS — I2510 Atherosclerotic heart disease of native coronary artery without angina pectoris: Secondary | ICD-10-CM | POA: Diagnosis not present

## 2016-11-16 DIAGNOSIS — J40 Bronchitis, not specified as acute or chronic: Secondary | ICD-10-CM | POA: Diagnosis not present

## 2016-11-16 DIAGNOSIS — J449 Chronic obstructive pulmonary disease, unspecified: Secondary | ICD-10-CM | POA: Diagnosis not present

## 2016-11-16 DIAGNOSIS — Z7982 Long term (current) use of aspirin: Secondary | ICD-10-CM | POA: Diagnosis not present

## 2016-11-16 DIAGNOSIS — Z955 Presence of coronary angioplasty implant and graft: Secondary | ICD-10-CM | POA: Diagnosis not present

## 2016-11-16 DIAGNOSIS — I10 Essential (primary) hypertension: Secondary | ICD-10-CM | POA: Diagnosis not present

## 2016-11-16 DIAGNOSIS — G2581 Restless legs syndrome: Secondary | ICD-10-CM | POA: Diagnosis not present

## 2016-11-16 DIAGNOSIS — Z87891 Personal history of nicotine dependence: Secondary | ICD-10-CM | POA: Diagnosis not present

## 2016-11-16 DIAGNOSIS — R05 Cough: Secondary | ICD-10-CM | POA: Diagnosis not present

## 2016-11-16 DIAGNOSIS — E785 Hyperlipidemia, unspecified: Secondary | ICD-10-CM | POA: Diagnosis not present

## 2016-11-16 DIAGNOSIS — K219 Gastro-esophageal reflux disease without esophagitis: Secondary | ICD-10-CM | POA: Diagnosis not present

## 2016-11-16 DIAGNOSIS — Z7984 Long term (current) use of oral hypoglycemic drugs: Secondary | ICD-10-CM | POA: Diagnosis not present

## 2016-11-16 DIAGNOSIS — Z888 Allergy status to other drugs, medicaments and biological substances status: Secondary | ICD-10-CM | POA: Diagnosis not present

## 2016-11-16 DIAGNOSIS — K449 Diaphragmatic hernia without obstruction or gangrene: Secondary | ICD-10-CM | POA: Diagnosis not present

## 2016-11-16 DIAGNOSIS — Z880 Allergy status to penicillin: Secondary | ICD-10-CM | POA: Diagnosis not present

## 2016-11-16 DIAGNOSIS — Z791 Long term (current) use of non-steroidal anti-inflammatories (NSAID): Secondary | ICD-10-CM | POA: Diagnosis not present

## 2016-11-16 DIAGNOSIS — G4733 Obstructive sleep apnea (adult) (pediatric): Secondary | ICD-10-CM | POA: Diagnosis not present

## 2016-11-16 DIAGNOSIS — Z883 Allergy status to other anti-infective agents status: Secondary | ICD-10-CM | POA: Diagnosis not present

## 2016-11-16 DIAGNOSIS — M1990 Unspecified osteoarthritis, unspecified site: Secondary | ICD-10-CM | POA: Diagnosis not present

## 2016-11-16 DIAGNOSIS — Z79899 Other long term (current) drug therapy: Secondary | ICD-10-CM | POA: Diagnosis not present

## 2016-11-16 DIAGNOSIS — E119 Type 2 diabetes mellitus without complications: Secondary | ICD-10-CM | POA: Diagnosis not present

## 2016-11-16 DIAGNOSIS — J209 Acute bronchitis, unspecified: Secondary | ICD-10-CM | POA: Diagnosis not present

## 2016-11-16 DIAGNOSIS — J45909 Unspecified asthma, uncomplicated: Secondary | ICD-10-CM | POA: Diagnosis not present

## 2016-11-26 ENCOUNTER — Encounter (HOSPITAL_COMMUNITY): Payer: Self-pay | Admitting: *Deleted

## 2016-11-26 NOTE — Progress Notes (Signed)
Pt denies SOB and chest pain. Pt stated that her fasting blood glucose is usually 110-120. Pt stated that last dose of Aspirin was " over a week ago." Pt made aware of diabetes protocol to not take Glimepiride tonight or morning of surgery, to only take half of HS dose of Lantus Insulin tonight (10 units instead of 21), interventions for a blood glucose <70 and phone # to SS. Pt made aware to stop taking vitamins, fish oil and herbal medications. Do not take any NSAIDs ie: Ibuprofen, Advil, Naproxen, BC and Goody Powder or any medication containing Aspirin. Pt verbalized understanding of all pre-op instructions.

## 2016-11-27 ENCOUNTER — Encounter (HOSPITAL_COMMUNITY): Payer: Self-pay | Admitting: *Deleted

## 2016-11-27 ENCOUNTER — Inpatient Hospital Stay (HOSPITAL_COMMUNITY)
Admission: RE | Admit: 2016-11-27 | Discharge: 2016-11-29 | DRG: 460 | Disposition: A | Discharge status: Home Health | Payer: Medicare Other | Source: Ambulatory Visit | Attending: Neurological Surgery | Admitting: Neurological Surgery

## 2016-11-27 ENCOUNTER — Inpatient Hospital Stay (HOSPITAL_COMMUNITY): Payer: Medicare Other

## 2016-11-27 ENCOUNTER — Inpatient Hospital Stay (HOSPITAL_COMMUNITY): Payer: Medicare Other | Admitting: Vascular Surgery

## 2016-11-27 ENCOUNTER — Inpatient Hospital Stay (HOSPITAL_COMMUNITY): Payer: Medicare Other | Admitting: Anesthesiology

## 2016-11-27 ENCOUNTER — Encounter (HOSPITAL_COMMUNITY)
Admission: RE | Disposition: A | Discharge status: Home Health | Payer: Self-pay | Source: Ambulatory Visit | Attending: Neurological Surgery

## 2016-11-27 DIAGNOSIS — M48062 Spinal stenosis, lumbar region with neurogenic claudication: Secondary | ICD-10-CM | POA: Diagnosis not present

## 2016-11-27 DIAGNOSIS — E039 Hypothyroidism, unspecified: Secondary | ICD-10-CM | POA: Diagnosis present

## 2016-11-27 DIAGNOSIS — Z881 Allergy status to other antibiotic agents status: Secondary | ICD-10-CM

## 2016-11-27 DIAGNOSIS — F329 Major depressive disorder, single episode, unspecified: Secondary | ICD-10-CM | POA: Diagnosis present

## 2016-11-27 DIAGNOSIS — Z419 Encounter for procedure for purposes other than remedying health state, unspecified: Secondary | ICD-10-CM

## 2016-11-27 DIAGNOSIS — M5136 Other intervertebral disc degeneration, lumbar region: Principal | ICD-10-CM | POA: Diagnosis present

## 2016-11-27 DIAGNOSIS — E1140 Type 2 diabetes mellitus with diabetic neuropathy, unspecified: Secondary | ICD-10-CM | POA: Diagnosis present

## 2016-11-27 DIAGNOSIS — I2510 Atherosclerotic heart disease of native coronary artery without angina pectoris: Secondary | ICD-10-CM | POA: Diagnosis present

## 2016-11-27 DIAGNOSIS — M797 Fibromyalgia: Secondary | ICD-10-CM | POA: Diagnosis present

## 2016-11-27 DIAGNOSIS — Z794 Long term (current) use of insulin: Secondary | ICD-10-CM

## 2016-11-27 DIAGNOSIS — Z888 Allergy status to other drugs, medicaments and biological substances status: Secondary | ICD-10-CM | POA: Diagnosis not present

## 2016-11-27 DIAGNOSIS — Z955 Presence of coronary angioplasty implant and graft: Secondary | ICD-10-CM

## 2016-11-27 DIAGNOSIS — Z889 Allergy status to unspecified drugs, medicaments and biological substances status: Secondary | ICD-10-CM | POA: Diagnosis not present

## 2016-11-27 DIAGNOSIS — M4317 Spondylolisthesis, lumbosacral region: Secondary | ICD-10-CM | POA: Diagnosis present

## 2016-11-27 DIAGNOSIS — G4730 Sleep apnea, unspecified: Secondary | ICD-10-CM | POA: Diagnosis present

## 2016-11-27 DIAGNOSIS — M4326 Fusion of spine, lumbar region: Secondary | ICD-10-CM | POA: Diagnosis not present

## 2016-11-27 DIAGNOSIS — J449 Chronic obstructive pulmonary disease, unspecified: Secondary | ICD-10-CM | POA: Diagnosis present

## 2016-11-27 DIAGNOSIS — Z7982 Long term (current) use of aspirin: Secondary | ICD-10-CM

## 2016-11-27 DIAGNOSIS — M79605 Pain in left leg: Secondary | ICD-10-CM | POA: Diagnosis not present

## 2016-11-27 DIAGNOSIS — F419 Anxiety disorder, unspecified: Secondary | ICD-10-CM | POA: Diagnosis present

## 2016-11-27 DIAGNOSIS — R569 Unspecified convulsions: Secondary | ICD-10-CM | POA: Diagnosis not present

## 2016-11-27 DIAGNOSIS — G2581 Restless legs syndrome: Secondary | ICD-10-CM | POA: Diagnosis present

## 2016-11-27 DIAGNOSIS — Z9049 Acquired absence of other specified parts of digestive tract: Secondary | ICD-10-CM

## 2016-11-27 DIAGNOSIS — Z981 Arthrodesis status: Secondary | ICD-10-CM

## 2016-11-27 DIAGNOSIS — M48061 Spinal stenosis, lumbar region without neurogenic claudication: Secondary | ICD-10-CM | POA: Diagnosis not present

## 2016-11-27 DIAGNOSIS — I10 Essential (primary) hypertension: Secondary | ICD-10-CM | POA: Diagnosis present

## 2016-11-27 DIAGNOSIS — M4807 Spinal stenosis, lumbosacral region: Secondary | ICD-10-CM | POA: Diagnosis not present

## 2016-11-27 DIAGNOSIS — M549 Dorsalgia, unspecified: Secondary | ICD-10-CM | POA: Diagnosis not present

## 2016-11-27 HISTORY — PX: TRANSFORAMINAL LUMBAR INTERBODY FUSION (TLIF) WITH PEDICLE SCREW FIXATION 3 LEVEL: SHX6143

## 2016-11-27 LAB — CBC
HCT: 35.4 % — ABNORMAL LOW (ref 36.0–46.0)
Hemoglobin: 11.6 g/dL — ABNORMAL LOW (ref 12.0–15.0)
MCH: 29.7 pg (ref 26.0–34.0)
MCHC: 32.8 g/dL (ref 30.0–36.0)
MCV: 90.5 fL (ref 78.0–100.0)
Platelets: 213 10*3/uL (ref 150–400)
RBC: 3.91 MIL/uL (ref 3.87–5.11)
RDW: 15.5 % (ref 11.5–15.5)
WBC: 8 10*3/uL (ref 4.0–10.5)

## 2016-11-27 LAB — BASIC METABOLIC PANEL
Anion gap: 13 (ref 5–15)
BUN: 39 mg/dL — ABNORMAL HIGH (ref 6–20)
CO2: 22 mmol/L (ref 22–32)
Calcium: 10 mg/dL (ref 8.9–10.3)
Chloride: 104 mmol/L (ref 101–111)
Creatinine, Ser: 1.32 mg/dL — ABNORMAL HIGH (ref 0.44–1.00)
GFR calc Af Amer: 48 mL/min — ABNORMAL LOW (ref >60)
GFR calc non Af Amer: 41 mL/min — ABNORMAL LOW (ref >60)
Glucose, Bld: 86 mg/dL (ref 65–99)
Potassium: 4.4 mmol/L (ref 3.5–5.1)
Sodium: 139 mmol/L (ref 135–145)

## 2016-11-27 LAB — GLUCOSE, CAPILLARY
Glucose-Capillary: 102 mg/dL — ABNORMAL HIGH (ref 65–99)
Glucose-Capillary: 133 mg/dL — ABNORMAL HIGH (ref 65–99)

## 2016-11-27 LAB — PREPARE RBC (CROSSMATCH)

## 2016-11-27 LAB — HEMOGLOBIN A1C
HEMOGLOBIN A1C: 6 % — AB (ref 4.8–5.6)
MEAN PLASMA GLUCOSE: 126 mg/dL

## 2016-11-27 SURGERY — TRANSFORAMINAL LUMBAR INTERBODY FUSION (TLIF) WITH PEDICLE SCREW FIXATION 3 LEVEL
Anesthesia: General | Site: Back

## 2016-11-27 MED ORDER — PHENYLEPHRINE 40 MCG/ML (10ML) SYRINGE FOR IV PUSH (FOR BLOOD PRESSURE SUPPORT)
PREFILLED_SYRINGE | INTRAVENOUS | Status: DC | PRN
  Administered 2016-11-27: 80 ug via INTRAVENOUS
  Administered 2016-11-27: 120 ug via INTRAVENOUS
  Administered 2016-11-27: 80 ug via INTRAVENOUS
  Administered 2016-11-27: 120 ug via INTRAVENOUS

## 2016-11-27 MED ORDER — PHENOL 1.4 % MT LIQD: 1.0000 | OROMUCOSAL | Status: DC | PRN

## 2016-11-27 MED ORDER — BUSPIRONE HCL 10 MG PO TABS
15.0000 mg | ORAL_TABLET | Freq: Every day | ORAL | Status: DC
  Administered 2016-11-28 – 2016-11-29 (×2): 15 mg via ORAL
  Filled 2016-11-27 (×2): qty 2

## 2016-11-27 MED ORDER — VANCOMYCIN HCL 1000 MG IV SOLR
INTRAVENOUS | Status: AC
  Filled 2016-11-27: qty 1000

## 2016-11-27 MED ORDER — SODIUM CHLORIDE 0.9 % IV SOLN: Freq: Once | INTRAVENOUS | Status: DC

## 2016-11-27 MED ORDER — POTASSIUM CHLORIDE IN NACL 20-0.9 MEQ/L-% IV SOLN
INTRAVENOUS | Status: DC
  Administered 2016-11-28: via INTRAVENOUS
  Filled 2016-11-27 (×3): qty 1000

## 2016-11-27 MED ORDER — FUROSEMIDE 20 MG PO TABS
20.0000 mg | ORAL_TABLET | Freq: Every day | ORAL | Status: DC
  Administered 2016-11-28 – 2016-11-29 (×2): 20 mg via ORAL
  Filled 2016-11-27 (×2): qty 1

## 2016-11-27 MED ORDER — VANCOMYCIN HCL IN DEXTROSE 1-5 GM/200ML-% IV SOLN
1000.0000 mg | Freq: Once | INTRAVENOUS | Status: AC
  Administered 2016-11-28: 1000 mg via INTRAVENOUS
  Filled 2016-11-27: qty 200

## 2016-11-27 MED ORDER — ARTIFICIAL TEARS OP OINT
TOPICAL_OINTMENT | OPHTHALMIC | Status: DC | PRN
  Administered 2016-11-27: 1 via OPHTHALMIC

## 2016-11-27 MED ORDER — THROMBIN 20000 UNITS EX SOLR
CUTANEOUS | Status: AC
  Filled 2016-11-27: qty 20000

## 2016-11-27 MED ORDER — SUGAMMADEX SODIUM 200 MG/2ML IV SOLN
INTRAVENOUS | Status: DC | PRN
  Administered 2016-11-27: 200 mg via INTRAVENOUS

## 2016-11-27 MED ORDER — LACTATED RINGERS IV SOLN
INTRAVENOUS | Status: DC | PRN
  Administered 2016-11-27 (×3): via INTRAVENOUS

## 2016-11-27 MED ORDER — BUPIVACAINE HCL (PF) 0.25 % IJ SOLN
INTRAMUSCULAR | Status: DC | PRN
  Administered 2016-11-27: 6 mL

## 2016-11-27 MED ORDER — MONTELUKAST SODIUM 10 MG PO TABS
10.0000 mg | ORAL_TABLET | Freq: Every morning | ORAL | Status: DC
  Administered 2016-11-28 – 2016-11-29 (×2): 10 mg via ORAL
  Filled 2016-11-27 (×2): qty 1

## 2016-11-27 MED ORDER — MORPHINE SULFATE (PF) 2 MG/ML IV SOLN
1.0000 mg | INTRAVENOUS | Status: DC | PRN
  Administered 2016-11-28: 4 mg via INTRAVENOUS
  Filled 2016-11-27: qty 2

## 2016-11-27 MED ORDER — TRIAMTERENE-HCTZ 37.5-25 MG PO CAPS
1.0000 | ORAL_CAPSULE | Freq: Every evening | ORAL | Status: DC
  Administered 2016-11-28: 1 via ORAL
  Filled 2016-11-27 (×4): qty 1

## 2016-11-27 MED ORDER — CLONAZEPAM 1 MG PO TABS
1.0000 mg | ORAL_TABLET | Freq: Every evening | ORAL | Status: DC
  Administered 2016-11-28: 1 mg via ORAL
  Filled 2016-11-27 (×2): qty 1

## 2016-11-27 MED ORDER — VANCOMYCIN HCL 1000 MG IV SOLR
INTRAVENOUS | Status: DC | PRN
  Administered 2016-11-27: 1000 mg

## 2016-11-27 MED ORDER — 0.9 % SODIUM CHLORIDE (POUR BTL) OPTIME
TOPICAL | Status: DC | PRN
  Administered 2016-11-27: 1000 mL

## 2016-11-27 MED ORDER — ROCURONIUM BROMIDE 100 MG/10ML IV SOLN
INTRAVENOUS | Status: DC | PRN
Start: 2016-11-27 — End: 2016-11-27
  Administered 2016-11-27: 30 mg via INTRAVENOUS
  Administered 2016-11-27: 20 mg via INTRAVENOUS
  Administered 2016-11-27 (×2): 10 mg via INTRAVENOUS

## 2016-11-27 MED ORDER — SODIUM CHLORIDE 0.9 % IV SOLN: 250.0000 mL | INTRAVENOUS | Status: DC

## 2016-11-27 MED ORDER — ACETAMINOPHEN 325 MG PO TABS: 650.0000 mg | ORAL_TABLET | ORAL | Status: DC | PRN

## 2016-11-27 MED ORDER — BUSPIRONE HCL 10 MG PO TABS: 15.0000 mg | ORAL_TABLET | Freq: Two times a day (BID) | ORAL | Status: DC

## 2016-11-27 MED ORDER — HYDROMORPHONE HCL 2 MG/ML IJ SOLN
INTRAMUSCULAR | Status: AC
  Administered 2016-11-27: 0.5 mg
  Filled 2016-11-27: qty 1

## 2016-11-27 MED ORDER — MIDAZOLAM HCL 5 MG/5ML IJ SOLN
INTRAMUSCULAR | Status: DC | PRN
  Administered 2016-11-27: 2 mg via INTRAVENOUS

## 2016-11-27 MED ORDER — VENLAFAXINE HCL ER 75 MG PO CP24: 75.0000 mg | ORAL_CAPSULE | Freq: Every day | ORAL | Status: DC

## 2016-11-27 MED ORDER — THROMBIN 5000 UNITS EX SOLR
OROMUCOSAL | Status: DC | PRN
  Administered 2016-11-27: 14:00:00 via TOPICAL

## 2016-11-27 MED ORDER — SODIUM CHLORIDE 0.9 % IR SOLN
Status: DC | PRN
  Administered 2016-11-27: 14:00:00

## 2016-11-27 MED ORDER — FENTANYL CITRATE (PF) 100 MCG/2ML IJ SOLN
INTRAMUSCULAR | Status: AC
  Filled 2016-11-27: qty 2

## 2016-11-27 MED ORDER — OXYCODONE HCL 5 MG PO TABS
10.0000 mg | ORAL_TABLET | Freq: Four times a day (QID) | ORAL | Status: DC | PRN
  Administered 2016-11-27 – 2016-11-29 (×4): 10 mg via ORAL
  Filled 2016-11-27 (×5): qty 2

## 2016-11-27 MED ORDER — FENTANYL CITRATE (PF) 100 MCG/2ML IJ SOLN
INTRAMUSCULAR | Status: AC
  Filled 2016-11-27: qty 4

## 2016-11-27 MED ORDER — ONDANSETRON HCL 4 MG/2ML IJ SOLN
INTRAMUSCULAR | Status: AC
  Filled 2016-11-27: qty 2

## 2016-11-27 MED ORDER — PHENYTOIN SODIUM EXTENDED 100 MG PO CAPS
100.0000 mg | ORAL_CAPSULE | Freq: Three times a day (TID) | ORAL | Status: DC
  Administered 2016-11-27 – 2016-11-29 (×5): 100 mg via ORAL
  Filled 2016-11-27 (×4): qty 1

## 2016-11-27 MED ORDER — GEMFIBROZIL 600 MG PO TABS
600.0000 mg | ORAL_TABLET | Freq: Two times a day (BID) | ORAL | Status: DC
  Administered 2016-11-28 – 2016-11-29 (×3): 600 mg via ORAL
  Filled 2016-11-27 (×4): qty 1

## 2016-11-27 MED ORDER — MENTHOL 3 MG MT LOZG: 1.0000 | LOZENGE | OROMUCOSAL | Status: DC | PRN

## 2016-11-27 MED ORDER — THROMBIN 5000 UNITS EX SOLR
CUTANEOUS | Status: AC
  Filled 2016-11-27: qty 5000

## 2016-11-27 MED ORDER — CHLORHEXIDINE GLUCONATE CLOTH 2 % EX PADS: 6.0000 | MEDICATED_PAD | Freq: Once | CUTANEOUS | Status: DC

## 2016-11-27 MED ORDER — LACTATED RINGERS IV SOLN
INTRAVENOUS | Status: DC
  Administered 2016-11-27: 10:00:00 via INTRAVENOUS

## 2016-11-27 MED ORDER — LISINOPRIL 10 MG PO TABS
10.0000 mg | ORAL_TABLET | Freq: Every day | ORAL | Status: DC
  Administered 2016-11-28: 10 mg via ORAL
  Filled 2016-11-27 (×2): qty 1

## 2016-11-27 MED ORDER — GABAPENTIN 300 MG PO CAPS
600.0000 mg | ORAL_CAPSULE | Freq: Four times a day (QID) | ORAL | Status: DC
  Administered 2016-11-27 – 2016-11-29 (×6): 600 mg via ORAL
  Filled 2016-11-27 (×5): qty 2

## 2016-11-27 MED ORDER — GLIPIZIDE 5 MG PO TABS
10.0000 mg | ORAL_TABLET | Freq: Two times a day (BID) | ORAL | Status: DC
  Administered 2016-11-28 – 2016-11-29 (×3): 10 mg via ORAL
  Filled 2016-11-27 (×3): qty 2

## 2016-11-27 MED ORDER — CYCLOBENZAPRINE HCL 10 MG PO TABS
5.0000 mg | ORAL_TABLET | Freq: Three times a day (TID) | ORAL | Status: DC | PRN
  Administered 2016-11-28: 10 mg via ORAL
  Filled 2016-11-27 (×2): qty 1

## 2016-11-27 MED ORDER — PHENYLEPHRINE HCL 10 MG/ML IJ SOLN
INTRAVENOUS | Status: DC | PRN
  Administered 2016-11-27: 10 ug/min via INTRAVENOUS
  Administered 2016-11-27: 50 ug/min via INTRAVENOUS

## 2016-11-27 MED ORDER — TRAZODONE HCL 100 MG PO TABS
150.0000 mg | ORAL_TABLET | Freq: Every day | ORAL | Status: DC
  Administered 2016-11-28 (×2): 150 mg via ORAL
  Filled 2016-11-27 (×2): qty 1

## 2016-11-27 MED ORDER — SODIUM CHLORIDE 0.9% FLUSH: 3.0000 mL | INTRAVENOUS | Status: DC | PRN

## 2016-11-27 MED ORDER — SODIUM CHLORIDE 0.9% FLUSH
3.0000 mL | Freq: Two times a day (BID) | INTRAVENOUS | Status: DC
  Administered 2016-11-28 (×2): 3 mL via INTRAVENOUS
  Administered 2016-11-28: 10 mL via INTRAVENOUS

## 2016-11-27 MED ORDER — SODIUM CHLORIDE 0.9 % IV SOLN
INTRAVENOUS | Status: DC | PRN
  Administered 2016-11-27: 19:00:00 via INTRAVENOUS

## 2016-11-27 MED ORDER — LEVOTHYROXINE SODIUM 100 MCG PO TABS
200.0000 ug | ORAL_TABLET | Freq: Every day | ORAL | Status: DC
  Administered 2016-11-28 – 2016-11-29 (×2): 200 ug via ORAL
  Filled 2016-11-27 (×2): qty 2

## 2016-11-27 MED ORDER — SODIUM CHLORIDE 0.9 % IV SOLN
1500.0000 mg | INTRAVENOUS | Status: AC
  Administered 2016-11-27: 14:00:00 via INTRAVENOUS
  Administered 2016-11-27: 1500 mg via INTRAVENOUS
  Filled 2016-11-27: qty 1500

## 2016-11-27 MED ORDER — BUPIVACAINE HCL (PF) 0.25 % IJ SOLN
INTRAMUSCULAR | Status: AC
  Filled 2016-11-27: qty 30

## 2016-11-27 MED ORDER — LIDOCAINE 2% (20 MG/ML) 5 ML SYRINGE
INTRAMUSCULAR | Status: AC
  Filled 2016-11-27: qty 5

## 2016-11-27 MED ORDER — DEXAMETHASONE SODIUM PHOSPHATE 10 MG/ML IJ SOLN
10.0000 mg | INTRAMUSCULAR | Status: AC
Start: 2016-11-27 — End: 2016-11-27
  Administered 2016-11-27: 10 mg via INTRAVENOUS
  Filled 2016-11-27: qty 1

## 2016-11-27 MED ORDER — BUSPIRONE HCL 10 MG PO TABS
30.0000 mg | ORAL_TABLET | Freq: Every day | ORAL | Status: DC
  Administered 2016-11-27 – 2016-11-28 (×2): 30 mg via ORAL
  Filled 2016-11-27 (×2): qty 3

## 2016-11-27 MED ORDER — ONDANSETRON HCL 4 MG/2ML IJ SOLN
4.0000 mg | INTRAMUSCULAR | Status: DC | PRN
Start: 2016-11-27 — End: 2016-11-29

## 2016-11-27 MED ORDER — LIDOCAINE HCL (CARDIAC) 20 MG/ML IV SOLN
INTRAVENOUS | Status: DC | PRN
  Administered 2016-11-27: 60 mg via INTRAVENOUS

## 2016-11-27 MED ORDER — THROMBIN 20000 UNITS EX SOLR
CUTANEOUS | Status: DC | PRN
  Administered 2016-11-27: 14:00:00 via TOPICAL

## 2016-11-27 MED ORDER — LAMOTRIGINE 100 MG PO TABS
150.0000 mg | ORAL_TABLET | Freq: Two times a day (BID) | ORAL | Status: DC
  Administered 2016-11-27 – 2016-11-29 (×4): 150 mg via ORAL
  Filled 2016-11-27 (×4): qty 2

## 2016-11-27 MED ORDER — ROCURONIUM BROMIDE 10 MG/ML (PF) SYRINGE
PREFILLED_SYRINGE | INTRAVENOUS | Status: AC
  Filled 2016-11-27: qty 10

## 2016-11-27 MED ORDER — ARTIFICIAL TEARS OP OINT
TOPICAL_OINTMENT | OPHTHALMIC | Status: AC
  Filled 2016-11-27: qty 3.5

## 2016-11-27 MED ORDER — HYDROMORPHONE HCL 1 MG/ML IJ SOLN: 0.2500 mg | INTRAMUSCULAR | Status: DC | PRN

## 2016-11-27 MED ORDER — ONDANSETRON HCL 4 MG/2ML IJ SOLN
INTRAMUSCULAR | Status: DC | PRN
  Administered 2016-11-27: 4 mg via INTRAVENOUS

## 2016-11-27 MED ORDER — ACETAMINOPHEN 10 MG/ML IV SOLN
INTRAVENOUS | Status: AC
  Filled 2016-11-27: qty 100

## 2016-11-27 MED ORDER — FENTANYL CITRATE (PF) 100 MCG/2ML IJ SOLN
INTRAMUSCULAR | Status: DC | PRN
  Administered 2016-11-27 (×8): 50 ug via INTRAVENOUS

## 2016-11-27 MED ORDER — CELECOXIB 200 MG PO CAPS
200.0000 mg | ORAL_CAPSULE | Freq: Two times a day (BID) | ORAL | Status: DC
  Administered 2016-11-27 – 2016-11-29 (×4): 200 mg via ORAL
  Filled 2016-11-27 (×4): qty 1

## 2016-11-27 MED ORDER — SUCCINYLCHOLINE CHLORIDE 20 MG/ML IJ SOLN
INTRAMUSCULAR | Status: DC | PRN
  Administered 2016-11-27: 100 mg via INTRAVENOUS

## 2016-11-27 MED ORDER — ACETAMINOPHEN 10 MG/ML IV SOLN
INTRAVENOUS | Status: DC | PRN
Start: 2016-11-27 — End: 2016-11-27
  Administered 2016-11-27: 1000 mg via INTRAVENOUS

## 2016-11-27 MED ORDER — INSULIN ASPART 100 UNIT/ML ~~LOC~~ SOLN
0.0000 [IU] | Freq: Three times a day (TID) | SUBCUTANEOUS | Status: DC
  Administered 2016-11-28: 3 [IU] via SUBCUTANEOUS
  Administered 2016-11-28: 2 [IU] via SUBCUTANEOUS

## 2016-11-27 MED ORDER — MIDAZOLAM HCL 2 MG/2ML IJ SOLN
INTRAMUSCULAR | Status: AC
  Filled 2016-11-27: qty 2

## 2016-11-27 MED ORDER — VENLAFAXINE HCL ER 75 MG PO CP24
225.0000 mg | ORAL_CAPSULE | Freq: Every day | ORAL | Status: DC
  Administered 2016-11-28 – 2016-11-29 (×2): 225 mg via ORAL
  Filled 2016-11-27 (×2): qty 3

## 2016-11-27 MED ORDER — ALBUTEROL SULFATE (2.5 MG/3ML) 0.083% IN NEBU: 3.0000 mL | INHALATION_SOLUTION | Freq: Four times a day (QID) | RESPIRATORY_TRACT | Status: DC | PRN

## 2016-11-27 MED ORDER — VENLAFAXINE HCL ER 75 MG PO CP24: 150.0000 mg | ORAL_CAPSULE | Freq: Every day | ORAL | Status: DC

## 2016-11-27 MED ORDER — METOPROLOL SUCCINATE ER 25 MG PO TB24
25.0000 mg | ORAL_TABLET | Freq: Every day | ORAL | Status: DC
  Administered 2016-11-28 – 2016-11-29 (×2): 25 mg via ORAL
  Filled 2016-11-27 (×2): qty 1

## 2016-11-27 MED ORDER — EPHEDRINE SULFATE-NACL 50-0.9 MG/10ML-% IV SOSY
PREFILLED_SYRINGE | INTRAVENOUS | Status: DC | PRN
  Administered 2016-11-27: 10 mg via INTRAVENOUS
  Administered 2016-11-27 (×2): 5 mg via INTRAVENOUS

## 2016-11-27 MED ORDER — FENTANYL CITRATE (PF) 100 MCG/2ML IJ SOLN
100.0000 ug | Freq: Once | INTRAMUSCULAR | Status: AC
  Administered 2016-11-27: 100 ug via INTRAVENOUS
  Filled 2016-11-27: qty 2

## 2016-11-27 MED ORDER — PROPOFOL 10 MG/ML IV BOLUS
INTRAVENOUS | Status: AC
  Filled 2016-11-27: qty 20

## 2016-11-27 MED ORDER — DEXTROSE 50 % IV SOLN
INTRAVENOUS | Status: DC | PRN
  Administered 2016-11-27: 12.5 g via INTRAVENOUS

## 2016-11-27 MED ORDER — ALBUMIN HUMAN 5 % IV SOLN
INTRAVENOUS | Status: DC | PRN
  Administered 2016-11-27: 17:00:00 via INTRAVENOUS

## 2016-11-27 MED ORDER — PROPOFOL 10 MG/ML IV BOLUS
INTRAVENOUS | Status: DC | PRN
  Administered 2016-11-27: 20 mg via INTRAVENOUS
  Administered 2016-11-27: 100 mg via INTRAVENOUS

## 2016-11-27 MED ORDER — ACETAMINOPHEN 650 MG RE SUPP: 650.0000 mg | RECTAL | Status: DC | PRN

## 2016-11-27 MED ORDER — ASPIRIN EC 81 MG PO TBEC
81.0000 mg | DELAYED_RELEASE_TABLET | Freq: Every day | ORAL | Status: DC
  Administered 2016-11-28 – 2016-11-29 (×2): 81 mg via ORAL
  Filled 2016-11-27 (×2): qty 1

## 2016-11-27 MED ORDER — PHENYLEPHRINE 40 MCG/ML (10ML) SYRINGE FOR IV PUSH (FOR BLOOD PRESSURE SUPPORT)
PREFILLED_SYRINGE | INTRAVENOUS | Status: AC
  Filled 2016-11-27: qty 10

## 2016-11-27 SURGICAL SUPPLY — 65 items
BAG DECANTER FOR FLEXI CONT (MISCELLANEOUS) ×2 IMPLANT
BASKET BONE COLLECTION (BASKET) ×2 IMPLANT
BENZOIN TINCTURE PRP APPL 2/3 (GAUZE/BANDAGES/DRESSINGS) ×2 IMPLANT
BIT DRILL PLIF MAS 5.0MM DISP (DRILL) ×1 IMPLANT
BLADE CLIPPER SURG (BLADE) IMPLANT
BONE CANC CHIPS 40CC CAN1/2 (Bone Implant) ×2 IMPLANT
BUR MATCHSTICK NEURO 3.0 LAGG (BURR) ×2 IMPLANT
CAGE COROENT MP 8X23 (Cage) ×8 IMPLANT
CAGE COROENT MP 8X9X23M-8 SPIN (Cage) ×4 IMPLANT
CANISTER SUCT 3000ML PPV (MISCELLANEOUS) ×2 IMPLANT
CAP RELINE MOD TULIP RMM (Cap) ×8 IMPLANT
CARTRIDGE OIL MAESTRO DRILL (MISCELLANEOUS) ×1 IMPLANT
CHIPS CANC BONE 40CC CAN1/2 (Bone Implant) ×1 IMPLANT
CLIP NEUROVISION LG (CLIP) ×2 IMPLANT
CONT SPEC 4OZ CLIKSEAL STRL BL (MISCELLANEOUS) ×2 IMPLANT
COVER BACK TABLE 60X90IN (DRAPES) ×2 IMPLANT
DERMABOND ADVANCED (GAUZE/BANDAGES/DRESSINGS) ×1
DERMABOND ADVANCED .7 DNX12 (GAUZE/BANDAGES/DRESSINGS) ×1 IMPLANT
DIFFUSER DRILL AIR PNEUMATIC (MISCELLANEOUS) ×2 IMPLANT
DRAPE C-ARM 42X72 X-RAY (DRAPES) ×4 IMPLANT
DRAPE LAPAROTOMY 100X72X124 (DRAPES) ×2 IMPLANT
DRAPE POUCH INSTRU U-SHP 10X18 (DRAPES) ×2 IMPLANT
DRAPE SURG 17X23 STRL (DRAPES) ×2 IMPLANT
DRILL PLIF MAS 5.0MM DISP (DRILL) ×2
DRSG OPSITE POSTOP 4X8 (GAUZE/BANDAGES/DRESSINGS) ×2 IMPLANT
DURAPREP 26ML APPLICATOR (WOUND CARE) ×2 IMPLANT
ELECT REM PT RETURN 9FT ADLT (ELECTROSURGICAL) ×2
ELECTRODE REM PT RTRN 9FT ADLT (ELECTROSURGICAL) ×1 IMPLANT
EVACUATOR 1/8 PVC DRAIN (DRAIN) ×2 IMPLANT
GAUZE SPONGE 4X4 16PLY XRAY LF (GAUZE/BANDAGES/DRESSINGS) IMPLANT
GLOVE BIO SURGEON STRL SZ8 (GLOVE) ×4 IMPLANT
GLOVE ECLIPSE 8.0 STRL XLNG CF (GLOVE) ×4 IMPLANT
GLOVE INDICATOR 7.5 STRL GRN (GLOVE) ×4 IMPLANT
GLOVE INDICATOR 8.0 STRL GRN (GLOVE) ×2 IMPLANT
GLOVE SURG SS PI 7.0 STRL IVOR (GLOVE) ×4 IMPLANT
GOWN STRL REUS W/ TWL LRG LVL3 (GOWN DISPOSABLE) ×1 IMPLANT
GOWN STRL REUS W/ TWL XL LVL3 (GOWN DISPOSABLE) ×2 IMPLANT
GOWN STRL REUS W/TWL 2XL LVL3 (GOWN DISPOSABLE) IMPLANT
GOWN STRL REUS W/TWL LRG LVL3 (GOWN DISPOSABLE) ×1
GOWN STRL REUS W/TWL XL LVL3 (GOWN DISPOSABLE) ×2
HEMOSTAT POWDER SURGIFOAM 1G (HEMOSTASIS) ×2 IMPLANT
KIT BASIN OR (CUSTOM PROCEDURE TRAY) ×2 IMPLANT
KIT ROOM TURNOVER OR (KITS) ×2 IMPLANT
MODULE NVM5 NEXT GEN EMG (NEEDLE) ×2 IMPLANT
NEEDLE HYPO 25X1 1.5 SAFETY (NEEDLE) ×2 IMPLANT
NS IRRIG 1000ML POUR BTL (IV SOLUTION) ×2 IMPLANT
OIL CARTRIDGE MAESTRO DRILL (MISCELLANEOUS) ×2
PACK LAMINECTOMY NEURO (CUSTOM PROCEDURE TRAY) ×2 IMPLANT
PAD ARMBOARD 7.5X6 YLW CONV (MISCELLANEOUS) ×6 IMPLANT
ROD RELINE O COCR 5.0X90MM (Rod) ×4 IMPLANT
SCREW LOCK RSS 4.5/5.0MM (Screw) ×16 IMPLANT
SCREW RELINE RMM 5.0X35MM 4S (Screw) ×4 IMPLANT
SCREW RELINE RMM 6.5X40 4S (Screw) ×4 IMPLANT
SCREW SHANK RELINE MOD 5.0X35 (Screw) ×8 IMPLANT
SPONGE LAP 4X18 X RAY DECT (DISPOSABLE) ×2 IMPLANT
SPONGE SURGIFOAM ABS GEL 100 (HEMOSTASIS) ×4 IMPLANT
STRIP CLOSURE SKIN 1/2X4 (GAUZE/BANDAGES/DRESSINGS) ×2 IMPLANT
SUT VIC AB 0 CT1 18XCR BRD8 (SUTURE) ×2 IMPLANT
SUT VIC AB 0 CT1 8-18 (SUTURE) ×2
SUT VIC AB 2-0 CP2 18 (SUTURE) ×4 IMPLANT
SUT VIC AB 3-0 SH 8-18 (SUTURE) ×4 IMPLANT
TOWEL OR 17X24 6PK STRL BLUE (TOWEL DISPOSABLE) ×2 IMPLANT
TOWEL OR 17X26 10 PK STRL BLUE (TOWEL DISPOSABLE) ×2 IMPLANT
TRAY FOLEY W/METER SILVER 16FR (SET/KITS/TRAYS/PACK) ×2 IMPLANT
WATER STERILE IRR 1000ML POUR (IV SOLUTION) ×2 IMPLANT

## 2016-11-27 NOTE — OR Nursing (Signed)
Nuvasive nerve monitoring electrodes applied to upper and lower trunk extremity for neuro nerve monitoring applied after induction

## 2016-11-27 NOTE — Progress Notes (Signed)
Pharmacy Antibiotic Note  Elder Jenna Butler is a 65 y.o. female admitted on 11/27/2016 s/p spinal surgery today.  Pharmacy has been consulted for Vancomycin dosing x 1 for surgical prophylaxis. No drain in place.  Pt received Vanc 1.5 gm IV pre-op ~1340  Plan: Vancomycin 1000mg  IV now (~12 hr post pre-op dose) Pharmacy will sign off - please reconsult if needed  Height: 5\' 6"  (167.6 cm) Weight: 282 lb 6.4 oz (128.1 kg) IBW/kg (Calculated) : 59.3  Temp (24hrs), Avg:98.2 F (36.8 C), Min:97.8 F (36.6 C), Max:98.9 F (37.2 C)   Recent Labs Lab 11/27/16 0950  WBC 8.0  CREATININE 1.32*    Estimated Creatinine Clearance: 58.2 mL/min (by C-G formula based on SCr of 1.32 mg/dL (H)).    Allergies  Allergen Reactions  . Amoxicillin Shortness Of Breath and Rash  . Ceclor [Cefaclor] Shortness Of Breath, Itching, Swelling and Rash  . Lyrica [Pregabalin] Other (See Comments)    "causes me to be very depressed."  . Tape Dermatitis    "tears my skin."    Thank you for allowing pharmacy to be a part of this patient's care.  Christoper Fabianaron Charlottie Peragine, PharmD, BCPS Clinical pharmacist, pager [phone] 11/27/2016 11:30 PM

## 2016-11-27 NOTE — Anesthesia Postprocedure Evaluation (Signed)
Anesthesia Post Note  Patient: Jenna NegusLinda Butler  Procedure(s) Performed: Procedure(s) (LRB): Lumbar three-four, Lumbar four-five, Lumbar five-Sacral one Posterior Lumbar Interbody Fusion (N/A)  Patient location during evaluation: PACU Anesthesia Type: General Level of consciousness: awake and alert Pain management: pain level controlled Vital Signs Assessment: post-procedure vital signs reviewed and stable Respiratory status: spontaneous breathing, nonlabored ventilation, respiratory function stable and patient connected to nasal cannula oxygen Cardiovascular status: blood pressure returned to baseline and stable Postop Assessment: no signs of nausea or vomiting Anesthetic complications: no    Last Vitals:  Vitals:   11/27/16 2100 11/27/16 2157  BP:  (!) 110/58  Pulse:  (!) 105  Resp:  20  Temp: 36.7 C 36.6 C    Last Pain:  Vitals:   11/27/16 2157  TempSrc: Oral  PainSc:                  Kennieth RadFitzgerald, Kairi Harshbarger E

## 2016-11-27 NOTE — Transfer of Care (Signed)
Immediate Anesthesia Transfer of Care Note  Patient: Jenna NegusLinda Butler  Procedure(s) Performed: Procedure(s): Lumbar three-four, Lumbar four-five, Lumbar five-Sacral one Posterior Lumbar Interbody Fusion (N/A)  Patient Location: PACU  Anesthesia Type:General  Level of Consciousness: awake, sedated and patient cooperative  Airway & Oxygen Therapy: Patient connected to nasal cannula oxygen  Post-op Assessment: Report given to RN, Post -op Vital signs reviewed and stable and Patient moving all extremities X 4  Post vital signs: Reviewed and stable  Last Vitals:  Vitals:   11/27/16 0917 11/27/16 1945  BP: 111/63 (!) 91/51  Pulse: 96 (!) 102  Resp: 20 13  Temp: 36.7 C 37.2 C    Last Pain:  Vitals:   11/27/16 1945  TempSrc:   PainSc: (P) 0-No pain      Patients Stated Pain Goal: 4 (11/27/16 1007)  Complications: No apparent anesthesia complications

## 2016-11-27 NOTE — Op Note (Signed)
11/27/2016  7:29 PM  PATIENT:  Elder NegusLinda Kasinger  65 y.o. female  PRE-OPERATIVE DIAGNOSIS:  Post laminectomy spondylolisthesis L5-S1, severe degenerative disc disease L3-4 and L4-5 status post previous laminectomies, spinal stenosis L3-4 L4-5 and L5-S1, back and leg pain  POST-OPERATIVE DIAGNOSIS:  Same  PROCEDURE:   1. Decompressive lumbar laminectomy L3-4 L4-5 and L5-S1 bilaterally requiring more work than would be required for a simple exposure of the disk for PLIF in order to adequately decompress the neural elements and address the spinal stenosis 2. Posterior lumbar interbody fusion L3-4 L4-5 and L5-S1 using PEEK interbody cages packed with morcellized allograft and autograft 3. Posterior fixation L3-S1 inclusive using cortical pedicle screws.  4. Intertransverse arthrodesis L3-S1 using morcellized autograft and allograft.  SURGEON:  Marikay Alaravid Jeroline Wolbert, MD  ASSISTANTS: Dr. Conchita ParisNundkumar  ANESTHESIA:  General  EBL: 250 ml  Total I/O In: 1550 [I.V.:1550] Out: -   BLOOD ADMINISTERED:One unit CC PRBC  DRAINS: None  INDICATION FOR PROCEDURE: This patient underwent previous L3-4 L4-5 and L5-S1 laminectomies in the past. She presented with severe back and left leg pain. MRI showed significant spinal stenosis at L3-4 L4-5 and L5-S1 with a grade 1 spondylolisthesis at L5-S1 and severe degenerative disc disease. Her pain became debilitating. She tried medical management without relief. I recommended decompression and instrument effusion to address her segmental instability and spinal stenosis. Patient understood the risks, benefits, and alternatives and potential outcomes and wished to proceed.  PROCEDURE DETAILS:  The patient was brought to the operating room. After induction of generalized endotracheal anesthesia the patient was rolled into the prone position on chest rolls and all pressure points were padded. The patient's lumbar region was cleaned and then prepped with DuraPrep and draped in the  usual sterile fashion. Anesthesia was injected and then a dorsal midline incision was made and carried down to the lumbosacral fascia. The fascia was opened and the paraspinous musculature was taken down in a subperiosteal fashion to expose L3-4 L4-5 and L5-S1. A self-retaining retractor was placed. Intraoperative fluoroscopy confirmed my level, and I started with placement of the L3 and L4 cortical pedicle screws. The pedicle screw entry zones were identified utilizing surface landmarks and  AP and lateral fluoroscopy. I scored the cortex with the high-speed drill and then used the hand drill and EMG monitoring to drill an upward and outward direction into the pedicle. I then tapped line to line, and the tap was also monitored. I then placed a 5-0 x 35 mm cortical pedicle screw into the pedicles of L3 and L4 bilaterally. I then turned my attention to the decompression and the spinous process was removed and complete lumbar laminectomies, hemi- facetectomies, and foraminotomies were performed at L3-4 L4-5 and L5-S1. She had had previous hemilaminectomies at all 3 levels and great care was taken on the left to decompress the neural elements. At no time did we see an unintended durotomy. The exact same decompression was done at all 3 levels. The patient had significant spinal stenosis and this required more work than would be required for a simple exposure of the disc for posterior lumbar interbody fusion. Much more generous decompression was undertaken in order to adequately decompress the neural elements and address the patient's leg pain. The yellow ligament was removed to expose the underlying dura and nerve roots, and generous foraminotomies were performed to adequately decompress the neural elements. Both the exiting and traversing nerve roots were decompressed on both sides until a coronary dilator passed easily along the nerve  roots. Once the decompression was complete, I turned my attention to the posterior  lower lumbar interbody fusion. The epidural venous vasculature was coagulated and cut sharply. Disc space was incised and the initial discectomy was performed with pituitary rongeurs at all 3 levels. The disc space was distracted with sequential distractors to a height of 8 mm at all 3 levels. We then used a series of scrapers and shavers to prepare the endplates for fusion. The midline was prepared with Epstein curettes. Once the complete discectomy was finished, we packed an appropriate sized peek interbody cage with local autograft and morcellized allograft, gently retracted the nerve root, and tapped the cage into position at L3-4 L4-5 and L5-S1.  The midline between the cages was packed with morselized autograft and allograft. We then turned our attention to the placement of the lower pedicle screws. The pedicle screw entry zones were identified utilizing surface landmarks and fluoroscopy. I drilled into each pedicle utilizing the hand drill and EMG monitoring, and tapped each pedicle with the appropriate tap. We palpated with a ball probe to assure no break in the cortex. We then placed 5-0 x 35 mm cortical pedicle screws into the pedicles bilaterally at L5 and 6-5 x 40 mm pedicle screws into the sacrum bilaterally. We then decorticated the transverse processes and laid a mixture of morcellized autograft and allograft out over these to perform intertransverse arthrodesis at L3-S1. We then placed lordotic rods into the multiaxial screw heads of the pedicle screws and locked these in position with the locking caps and anti-torque device. We then checked our construct with AP and lateral fluoroscopy. Irrigated with copious amounts of bacitracin-containing saline solution. Placed a medium Hemovac drain through separate stab incision. Inspected the nerve roots once again to assure adequate decompression, lined to the dura with Gelfoam, and closed the muscle and the fascia with 0 Vicryl. Closed the subcutaneous  tissues with 2-0 Vicryl and subcuticular tissues with 3-0 Vicryl. The skin was closed with benzoin and Steri-Strips. Dressing was then applied, the patient was awakened from general anesthesia and transported to the recovery room in stable condition. At the end of the procedure all sponge, needle and instrument counts were correct.   PLAN OF CARE: Admit to inpatient   PATIENT DISPOSITION:  PACU - hemodynamically stable.   Delay start of Pharmacological VTE agent (>24hrs) due to surgical blood loss or risk of bleeding:  yes

## 2016-11-27 NOTE — H&P (Signed)
Subjective: Patient is a 65 y.o. female admitted for back and L leg pain. 2 previous back surgeries. Onset of symptoms was several years ago, gradually worsening since that time.  The pain is rated severe, and is located at the across the lower back and radiates to L hip and leg. The pain is described as aching and occurs all day. The symptoms have been progressive. Symptoms are exacerbated by exercise. MRI or CT showed spondylolisthesis and stenosis.   Past Medical History:  Diagnosis Date  . Anemia   . Anxiety   . Arthritis   . Asthma   . COPD (chronic obstructive pulmonary disease) (HCC)   . Coronary artery disease   . Depression   . Diabetes mellitus without complication (HCC)   . Dysrhythmia   . Family history of adverse reaction to anesthesia    mother woke up during surgery  . Fibromyalgia   . Headache    tension headaches and migraines  . Hypertension   . Hypothyroidism   . Neuropathy (HCC)    both hands, arms, feet and legs  . Restless legs   . Seizures (HCC)    small, focal type seizures, on Dilantin and Lamictal  . Sleep apnea    uses CPAP, haven't used CPAP in a while, getting a new one    Past Surgical History:  Procedure Laterality Date  . ABDOMINAL HYSTERECTOMY  2000  . APPENDECTOMY  1972  . BACK SURGERY    . CARDIAC CATHETERIZATION  2005   1 stent [facility]  . CHOLECYSTECTOMY  1974  . CORONARY ANGIOPLASTY    . HAND SURGERY     Plastic surgery  . LUMBAR LAMINECTOMY/DECOMPRESSION MICRODISCECTOMY Left 11/03/2014   Procedure: Left Lumbar three/four Intra/Extraformainal Microdiskectomy ;  Surgeon: Tia Alert, MD;  Location: [facility];  Service: Neurosurgery;  Laterality: Left;  . SHOULDER SURGERY    . TONSILLECTOMY    . TUBAL LIGATION      Prior to Admission medications   Medication Sig Start Date End Date Taking? Authorizing Provider  albuterol (PROVENTIL HFA;VENTOLIN HFA) 108 (90 BASE) MCG/ACT inhaler Inhale 2 puffs into the lungs every 6  (six) hours as needed for wheezing or shortness of breath.   Yes Historical Provider, MD  aspirin EC 81 MG tablet Take 81 mg by mouth daily.   Yes Historical Provider, MD  busPIRone (BUSPAR) 15 MG tablet Take 15-30 mg by mouth 2 (two) times daily. Take 15 mg in the morning and 30 mg in the evening.   Yes Historical Provider, MD  Calcium Carbonate (CALCIUM 600 PO) Take 1 tablet by mouth daily.   Yes Historical Provider, MD  Cholecalciferol (VITAMIN D3) 5000 units TABS Take 1 tablet by mouth 2 (two) times daily.   Yes Historical Provider, MD  clonazePAM (KLONOPIN) 0.5 MG tablet Take 1 mg by mouth every evening.    Yes Historical Provider, MD  cyclobenzaprine (FLEXERIL) 10 MG tablet Take 0.5-1 tablets (5-10 mg total) by mouth 3 (three) times daily as needed for muscle spasms. 11/04/14  Yes Tia Alert, MD  doxycycline (VIBRAMYCIN) 100 MG capsule Take 100 mg by mouth 2 (two) times daily.   Yes Historical Provider, MD  furosemide (LASIX) 20 MG tablet Take 20 mg by mouth daily.   Yes Historical Provider, MD  gabapentin (NEURONTIN) 300 MG capsule Take 600 mg by mouth 4 (four) times daily.    Yes Historical Provider, MD  gemfibrozil (LOPID) 600 MG tablet Take 600 mg  by mouth 2 (two) times daily before a meal.   Yes Historical Provider, MD  glipiZIDE (GLUCOTROL) 10 MG tablet Take 10 mg by mouth 2 (two) times daily before a meal.   Yes Historical Provider, MD  glucose 4 GM chewable tablet Chew 1 tablet by mouth as needed for low blood sugar.   Yes Historical Provider, MD  insulin glargine (LANTUS) 100 UNIT/ML injection Inject 21 Units into the skin at bedtime.   Yes Historical Provider, MD  lamoTRIgine (LAMICTAL) 150 MG tablet Take 150 mg by mouth 2 (two) times daily.   Yes Historical Provider, MD  levothyroxine (SYNTHROID, LEVOTHROID) 200 MCG tablet Take 200 mcg by mouth daily before breakfast.   Yes Historical Provider, MD  lisinopril (PRINIVIL,ZESTRIL) 20 MG tablet Take 10 mg by mouth daily.   Yes  Historical Provider, MD  metoprolol succinate (TOPROL-XL) 25 MG 24 hr tablet Take 25 mg by mouth daily.  08/26/16  Yes Historical Provider, MD  montelukast (SINGULAIR) 10 MG tablet Take 10 mg by mouth every morning.   Yes Historical Provider, MD  oxyCODONE (OXY IR/ROXICODONE) 5 MG immediate release tablet take 1 to 2 tablets by mouth every 6 hours if needed for pain 08/30/16  Yes Historical Provider, MD  phenytoin (DILANTIN) 100 MG ER capsule Take 100 mg by mouth 3 (three) times daily.   Yes Historical Provider, MD  traZODone (DESYREL) 150 MG tablet Take 150 mg by mouth at bedtime.   Yes Historical Provider, MD  triamterene-hydrochlorothiazide (DYAZIDE) 37.5-25 MG per capsule Take 1 capsule by mouth every evening.   Yes Historical Provider, MD  venlafaxine XR (EFFEXOR-XR) 150 MG 24 hr capsule Take 150 mg by mouth daily with breakfast.  09/15/16  Yes Historical Provider, MD  venlafaxine XR (EFFEXOR-XR) 75 MG 24 hr capsule Take 75 mg by mouth daily with breakfast.  09/15/16  Yes Historical Provider, MD  triamcinolone cream (KENALOG) 0.1 % Apply 1 application topically 2 (two) times daily as needed.    Historical Provider, MD   Allergies  Allergen Reactions  . Amoxicillin Shortness Of Breath and Rash  . Ceclor [Cefaclor] Shortness Of Breath, Itching, Swelling and Rash  . Lyrica [Pregabalin] Other (See Comments)    "causes me to be very depressed."  . Tape Dermatitis    "tears my skin."    Social History  Substance Use Topics  . Smoking status: Former Smoker    Packs/day: 2.00    Years: 30.00    Quit date: 10/30/1997  . Smokeless tobacco: Never Used  . Alcohol use No    Family History  Problem Relation Age of Onset  . Heart attack Mother   . Hypertension Mother   . Alzheimer's disease Mother      Review of Systems  Positive ROS: neg  All other systems have been reviewed and were otherwise negative with the exception of those mentioned in the HPI and as above.  Objective: Vital signs  in last 24 hours: Temp:  [98.1 F (36.7 C)] 98.1 F (36.7 C) (11/30 0917) Pulse Rate:  [96] 96 (11/30 0917) Resp:  [20] 20 (11/30 0917) BP: (111)/(63) 111/63 (11/30 0917) SpO2:  [99 %] 99 % (11/30 0917) Weight:  [117.5 kg (259 lb)] 117.5 kg (259 lb) (11/30 0917)  General Appearance: Alert, cooperative, no distress, appears stated age Head: Normocephalic, without obvious abnormality, atraumatic Eyes: PERRL, conjunctiva/corneas clear, EOM's intact    Neck: Supple, symmetrical, trachea midline Back: Symmetric, no curvature, ROM normal, no CVA tenderness Lungs:  respirations unlabored Heart: Regular rate and rhythm Abdomen: Soft, non-tender Extremities: Extremities normal, atraumatic, no cyanosis or edema Pulses: 2+ and symmetric all extremities Skin: Skin color, texture, turgor normal, no rashes or lesions  NEUROLOGIC:   Mental status: Alert and oriented x4,  no aphasia, good attention span, fund of knowledge, and memory Motor Exam - grossly normal Sensory Exam - grossly normal Reflexes: absent Coordination - grossly normal Gait - grossly normal Balance - grossly normal Cranial Nerves: I: smell Not tested  II: visual acuity  OS: nl    OD: nl  II: visual fields Full to confrontation  II: pupils Equal, round, reactive to light  III,VII: ptosis None  III,IV,VI: extraocular muscles  Full ROM  V: mastication Normal  V: facial light touch sensation  Normal  V,VII: corneal reflex  Present  VII: facial muscle function - upper  Normal  VII: facial muscle function - lower Normal  VIII: hearing Not tested  IX: soft palate elevation  Normal  IX,X: gag reflex Present  XI: trapezius strength  5/5  XI: sternocleidomastoid strength 5/5  XI: neck flexion strength  5/5  XII: tongue strength  Normal    Data Review Lab Results  Component Value Date   WBC 8.0 11/27/2016   HGB 11.6 (L) 11/27/2016   HCT 35.4 (L) 11/27/2016   MCV 90.5 11/27/2016   PLT 213 11/27/2016   Lab Results   Component Value Date   NA 142 11/12/2016   K 4.1 11/12/2016   CL 106 11/12/2016   CO2 27 11/12/2016   BUN 26 (H) 11/12/2016   CREATININE 1.06 (H) 11/12/2016   GLUCOSE 114 (H) 11/12/2016   Lab Results  Component Value Date   INR 1.03 11/12/2016    Assessment/Plan: Patient admitted for lumbar decompression and fusion L3-S1. Patient has failed a reasonable attempt at conservative therapy.  I explained the condition and procedure to the patient and answered any questions.  Patient wishes to proceed with procedure as planned. Understands risks/ benefits and typical outcomes of procedure.   Roland Lipke S 11/27/2016 10:17 AM

## 2016-11-27 NOTE — Anesthesia Preprocedure Evaluation (Addendum)
Anesthesia Evaluation  Patient identified by MRN, date of birth, ID band Patient awake    Reviewed: Allergy & Precautions, H&P , NPO status , Patient's Chart, lab work & pertinent test results, reviewed documented beta blocker date and time   Airway Mallampati: III  TM Distance: >3 FB Neck ROM: Full    Dental no notable dental hx. (+) Upper Dentures, Lower Dentures, Dental Advisory Given   Pulmonary asthma , sleep apnea , COPD,  COPD inhaler, former smoker,    Pulmonary exam normal breath sounds clear to auscultation       Cardiovascular hypertension, Pt. on medications and Pt. on home beta blockers + CAD and + Cardiac Stents   Rhythm:Regular Rate:Normal     Neuro/Psych  Headaches, Seizures -, Well Controlled,  Anxiety Depression    GI/Hepatic negative GI ROS, Neg liver ROS,   Endo/Other  diabetes, Insulin Dependent, Oral Hypoglycemic AgentsHypothyroidism Morbid obesity  Renal/GU negative Renal ROS  negative genitourinary   Musculoskeletal  (+) Arthritis , Osteoarthritis,  Fibromyalgia -  Abdominal   Peds  Hematology negative hematology ROS (+) anemia ,   Anesthesia Other Findings   Reproductive/Obstetrics negative OB ROS                            Anesthesia Physical Anesthesia Plan  ASA: III  Anesthesia Plan: General   Post-op Pain Management:    Induction: Intravenous  Airway Management Planned: Oral ETT  Additional Equipment: Arterial line  Intra-op Plan:   Post-operative Plan: Extubation in OR  Informed Consent: I have reviewed the patients History and Physical, chart, labs and discussed the procedure including the risks, benefits and alternatives for the proposed anesthesia with the patient or authorized representative who has indicated his/her understanding and acceptance.   Dental advisory given  Plan Discussed with: CRNA  Anesthesia Plan Comments:         Anesthesia Quick Evaluation

## 2016-11-27 NOTE — Anesthesia Procedure Notes (Signed)
Procedure Name: Intubation Date/Time: 11/27/2016 1:35 PM Performed by: Edmonia CaprioAUSTON, Ahsley Attwood M Pre-anesthesia Checklist: Patient identified, Emergency Drugs available, Suction available and Patient being monitored Patient Re-evaluated:Patient Re-evaluated prior to inductionOxygen Delivery Method: Circle system utilized Preoxygenation: Pre-oxygenation with 100% oxygen Intubation Type: IV induction Ventilation: Mask ventilation without difficulty Laryngoscope Size: Mac and 3 Grade View: Grade I Tube type: Oral Tube size: 7.0 mm Number of attempts: 1 Airway Equipment and Method: Stylet Placement Confirmation: ETT inserted through vocal cords under direct vision,  positive ETCO2 and breath sounds checked- equal and bilateral Secured at: 23 cm Tube secured with: Tape Dental Injury: Teeth and Oropharynx as per pre-operative assessment  Comments: Intubation performed by M. Lynford Humphreyougherty SRNA

## 2016-11-28 LAB — GLUCOSE, CAPILLARY
GLUCOSE-CAPILLARY: 130 mg/dL — AB (ref 65–99)
Glucose-Capillary: 146 mg/dL — ABNORMAL HIGH (ref 65–99)
Glucose-Capillary: 168 mg/dL — ABNORMAL HIGH (ref 65–99)
Glucose-Capillary: 100 mg/dL — ABNORMAL HIGH (ref 65–99)

## 2016-11-28 LAB — TYPE AND SCREEN
ABO/RH(D): O POS
Antibody Screen: NEGATIVE
UNIT DIVISION: 0

## 2016-11-28 LAB — BASIC METABOLIC PANEL
ANION GAP: 9 (ref 5–15)
BUN: 27 mg/dL — ABNORMAL HIGH (ref 6–20)
CO2: 24 mmol/L (ref 22–32)
Calcium: 8.7 mg/dL — ABNORMAL LOW (ref 8.9–10.3)
Chloride: 105 mmol/L (ref 101–111)
Creatinine, Ser: 0.98 mg/dL (ref 0.44–1.00)
GFR calc Af Amer: >60 mL/min (ref >60)
GFR calc non Af Amer: 59 mL/min — ABNORMAL LOW (ref >60)
GLUCOSE: 152 mg/dL — AB (ref 65–99)
POTASSIUM: 4.1 mmol/L (ref 3.5–5.1)
Sodium: 138 mmol/L (ref 135–145)

## 2016-11-28 LAB — CBC
HEMATOCRIT: 30.5 % — AB (ref 36.0–46.0)
HEMOGLOBIN: 9.9 g/dL — AB (ref 12.0–15.0)
MCH: 29.4 pg (ref 26.0–34.0)
MCHC: 32.5 g/dL (ref 30.0–36.0)
MCV: 90.5 fL (ref 78.0–100.0)
Platelets: 184 10*3/uL (ref 150–400)
RBC: 3.37 MIL/uL — ABNORMAL LOW (ref 3.87–5.11)
RDW: 15.4 % (ref 11.5–15.5)
WBC: 10.2 10*3/uL (ref 4.0–10.5)

## 2016-11-28 MED FILL — Heparin Sodium (Porcine) Inj 1000 Unit/ML: INTRAMUSCULAR | Qty: 30 | Status: AC

## 2016-11-28 MED FILL — Sodium Chloride IV Soln 0.9%: INTRAVENOUS | Qty: 1000 | Status: AC

## 2016-11-28 NOTE — Evaluation (Signed)
Physical Therapy Evaluation Patient Details Name: Jenna Butler MRN: [id number] DOB: 08/22/1951 Today's Date: 11/28/2016   History of Present Illness  Pt s/p lumbar laminectomy L3-4 L4-5 and L5-S1 bilaterally with PLIF PMHx- COPD,neuropathy, prior back surgeries, DM  Clinical Impression  Patient is s/p above surgery resulting in the deficits listed below (see PT Problem List). Patient walked in forward flexed position prior to surgery (due to pain), and required cues and assist for upright posture and safe use of RW.  Patient will benefit from skilled PT to increase their independence and safety with mobility (while adhering to their precautions) to allow discharge to the venue listed below.     Follow Up Recommendations No PT follow up;Supervision for mobility/OOB    Equipment Recommendations  None recommended by PT    Recommendations for Other Services OT consult     Precautions / Restrictions Precautions Precautions: Back;Fall Precaution Booklet Issued: Yes (comment) Precaution Comments: reports multiple falls PTA Required Braces or Orthoses: Spinal Brace Spinal Brace: Lumbar corset;Applied in sitting position      Mobility  Bed Mobility Overal bed mobility: Needs Assistance Bed Mobility: Rolling;Sidelying to Sit Rolling: Min assist Sidelying to sit: Min assist       General bed mobility comments: with rail; max cues to roll before taking her feet over EOB (twice had to put her feet back in the bed) assist to raise torso  Transfers Overall transfer level: Needs assistance Equipment used: Rolling walker (2 wheeled) Transfers: Sit to/from Stand Sit to Stand: Min assist;From elevated surface         General transfer comment: x2; max cues for proper hand position/safe use of RW  Ambulation/Gait Ambulation/Gait assistance: Min assist Ambulation Distance (Feet): 70 Feet Assistive device: Rolling walker (2 wheeled) Gait Pattern/deviations: Step-through  pattern;Decreased stride length   Gait velocity interpretation: Below normal speed for age/gender General Gait Details: flexed forward at hips at times; vc and asisst to stand upright  Stairs            Wheelchair Mobility    Modified Rankin (Stroke Patients Only)       Balance Overall balance assessment: Needs assistance;History of Falls         Standing balance support: Single extremity supported;During functional activity Standing balance-Leahy Scale: Poor Standing balance comment: single UE support while performing pericare in standing                             Pertinent Vitals/Pain Pain Assessment: 0-10 Pain Score: 5  Pain Location: back Pain Descriptors / Indicators: Operative site guarding;Aching Pain Intervention(s): Limited activity within patient's tolerance;Monitored during session;Premedicated before session;Repositioned    Home Living Family/patient expects to be discharged to:: Private residence Living Arrangements: Spouse/significant other;Children (daughter and son in law moved in) Available Help at Discharge: Family;Available 24 hours/day Type of Home: House Home Access: Stairs to enter Entrance Stairs-Rails: Doctor, general practiceight;Left Entrance Stairs-Number of Steps: 3 Home Layout: One level Home Equipment: Cane - single point;Walker - 2 wheels      Prior Function Level of Independence: Needs assistance   Gait / Transfers Assistance Needed: using cane  ADL's / Homemaking Assistance Needed: assist donning socks and shoes        Hand Dominance        Extremity/Trunk Assessment   Upper Extremity Assessment: Defer to OT evaluation           Lower Extremity Assessment: Generalized weakness (neuropathy bil )  Cervical / Trunk Assessment: Other exceptions  Communication   Communication: No difficulties  Cognition Arousal/Alertness: Awake/alert Behavior During Therapy: WFL for tasks assessed/performed;Impulsive (impulsive with  going to bathroom) Overall Cognitive Status: Within Functional Limits for tasks assessed                      General Comments      Exercises     Assessment/Plan    PT Assessment Patient needs continued PT services  PT Problem List Decreased strength;Decreased balance;Decreased mobility;Decreased knowledge of use of DME;Decreased knowledge of precautions;Impaired sensation;Obesity;Pain          PT Treatment Interventions DME instruction;Gait training;Functional mobility training;Therapeutic activities;Patient/family education    PT Goals (Current goals can be found in the Care Plan section)  Acute Rehab PT Goals Patient Stated Goal: return home with less pain PT Goal Formulation: With patient Time For Goal Achievement: 12/01/16 Potential to Achieve Goals: Good    Frequency Min 5X/week   Barriers to discharge        Co-evaluation               End of Session Equipment Utilized During Treatment: Back brace;Gait belt Activity Tolerance: Patient tolerated treatment well Patient left: in chair;with call bell/phone within reach;with family/visitor present           Time: 1121-1155 PT Time Calculation (min) (ACUTE ONLY): 34 min   Charges:   PT Evaluation $PT Eval Low Complexity: 1 Procedure PT Treatments $Gait Training: 8-22 mins   PT G CodesScherrie November:        Kennis Wissmann P Zayley Arras 11/28/2016, 12:38 PM Pager [phone]

## 2016-11-28 NOTE — Evaluation (Signed)
Occupational Therapy Evaluation Patient Details Name: Jenna NegusLinda Glasco MRN: [id number] DOB: 09/18/1951 Today's Date: 11/28/2016    History of Present Illness Pt s/p lumbar laminectomy L3-4 L4-5 and L5-S1 bilaterally with PLIF PMHx- COPD,neuropathy, prior back surgeries, DM   Clinical Impression   Pt admitted with the above diagnoses and presents with below problem list. Pt will benefit from continued acute OT to address the below listed deficits and maximize independence with basic ADLs prior to d/c to venue below. PTA pt was mod I with ADLs. Pt is currently min guard to min A with LB ADLs and functional transfers. ADL education provided to pt and spouse.      Follow Up Recommendations  No OT follow up;Supervision/Assistance - 24 hour    Equipment Recommendations  3 in 1 bedside comode;Tub/shower bench    Recommendations for Other Services       Precautions / Restrictions Precautions Precautions: Back;Fall Precaution Booklet Issued: Yes (comment) Precaution Comments: reports multiple falls PTA Required Braces or Orthoses: Spinal Brace Spinal Brace: Lumbar corset;Applied in sitting position Restrictions Weight Bearing Restrictions: No      Mobility Bed Mobility Overal bed mobility: Needs Assistance Bed Mobility: Sit to Supine Rolling: Min assist Sidelying to sit: Min assist       General bed mobility comments: cued and assist provided to facilitate sit>sidelying>roll with pt struggling with this and ultimately completing more as sit>supine.   Transfers Overall transfer level: Needs assistance Equipment used: Rolling walker (2 wheeled) Transfers: Sit to/from Stand Sit to Stand: Min assist;From elevated surface         General transfer comment: from recliner, BSC over toilet, and EOB. cues for hand placement    Balance Overall balance assessment: Needs assistance Sitting-balance support: No upper extremity supported;Feet supported Sitting balance-Leahy Scale:  Good     Standing balance support: Single extremity supported;Bilateral upper extremity supported Standing balance-Leahy Scale: Poor Standing balance comment: single UE support while performing pericare in standing                            ADL Overall ADL's : Needs assistance/impaired Eating/Feeding: Set up;Sitting   Grooming: Min guard;Standing   Upper Body Bathing: Set up;Sitting   Lower Body Bathing: With adaptive equipment;Sit to/from stand;Minimal assistance   Upper Body Dressing : Set up;Sitting   Lower Body Dressing: With adaptive equipment;Sit to/from stand;Minimal assistance   Toilet Transfer: Ambulation;RW;Minimal assistance   Toileting- Clothing Manipulation and Hygiene: With adaptive equipment;Sit to/from stand;Minimal assistance Toileting - Clothing Manipulation Details (indicate cue type and reason): therapist assisting with posterior pericare. educated on tongs as AE for pericare. Tub/ Shower Transfer: Minimal assistance;Tub transfer;Rolling walker;Ambulation;Min guard Tub/Shower Transfer Details (indicate cue type and reason): Pt verbalized concern about stepping over tub wall at home. Discussed tub transfer bench and installing grab bars. Advised to have someone with her when attempting tub shower transfer.  Functional mobility during ADLs: Min guard;Rolling walker General ADL Comments: Pt completed in-room functional mobility, toilet transfer, pericare, and grooming task as detailed above. Spouse present for session and included in educaiton.      Vision     Perception     Praxis      Pertinent Vitals/Pain Pain Assessment: Faces Pain Score: 5  Faces Pain Scale: Hurts a little bit Pain Location: back  Pain Descriptors / Indicators: Aching;Sore Pain Intervention(s): Monitored during session;Premedicated before session;Repositioned     Hand Dominance     Extremity/Trunk Assessment Upper  Extremity Assessment Upper Extremity Assessment:  Overall WFL for tasks assessed   Lower Extremity Assessment Lower Extremity Assessment: Defer to PT evaluation   Cervical / Trunk Assessment Cervical / Trunk Assessment: Other exceptions Cervical / Trunk Exceptions: obese   Communication Communication Communication: No difficulties   Cognition Arousal/Alertness: Awake/alert Behavior During Therapy: WFL for tasks assessed/performed;Impulsive Overall Cognitive Status: Within Functional Limits for tasks assessed                     General Comments       Exercises       Shoulder Instructions      Home Living Family/patient expects to be discharged to:: Private residence Living Arrangements: Spouse/significant other;Children Available Help at Discharge: Family;Available 24 hours/day Type of Home: House Home Access: Stairs to enter Entergy CorporationEntrance Stairs-Number of Steps: 3 Entrance Stairs-Rails: Right;Left Home Layout: One level     Bathroom Shower/Tub: IT trainerTub/shower unit;Curtain   Bathroom Toilet: Standard Bathroom Accessibility: Yes How Accessible: Accessible via walker Home Equipment: Cane - single point;Walker - 2 wheels          Prior Functioning/Environment Level of Independence: Needs assistance  Gait / Transfers Assistance Needed: using cane ADL's / Homemaking Assistance Needed: assist donning socks and shoes, fastening bra            OT Problem List: Impaired balance (sitting and/or standing);Decreased knowledge of use of DME or AE;Decreased knowledge of precautions;Pain   OT Treatment/Interventions: Self-care/ADL training;DME and/or AE instruction;Therapeutic activities;Patient/family education;Balance training    OT Goals(Current goals can be found in the care plan section) Acute Rehab OT Goals Patient Stated Goal: return home with less pain OT Goal Formulation: With patient/family Time For Goal Achievement: 12/05/16 Potential to Achieve Goals: Good ADL Goals Pt Will Perform Lower Body Bathing:  with modified independence;sit to/from stand Pt Will Perform Lower Body Dressing: with modified independence;sit to/from stand Pt Will Transfer to Toilet: with modified independence;ambulating Pt Will Perform Toileting - Clothing Manipulation and hygiene: with modified independence;sit to/from stand;with adaptive equipment Pt Will Perform Tub/Shower Transfer: Tub transfer;with min guard assist;ambulating;tub bench;rolling walker;3 in 1 Additional ADL Goal #1: Pt will complete log roll technique for bed mobility at min guard level to prepare for OOB ADLs.   OT Frequency: Min 2X/week   Barriers to D/C:            Co-evaluation              End of Session Equipment Utilized During Treatment: Gait belt;Rolling walker;Back brace  Activity Tolerance: Patient tolerated treatment well Patient left: in bed;with call bell/phone within reach;with family/visitor present   Time: [id number] OT Time Calculation (min): 30 min Charges:  OT General Charges $OT Visit: 1 Procedure OT Evaluation $OT Eval Low Complexity: 1 Procedure OT Treatments $Self Care/Home Management : 8-22 mins G-Codes:    Pilar GrammesMathews, Gailyn Crook H 11/28/2016, 2:24 PM

## 2016-11-28 NOTE — Progress Notes (Signed)
   11/28/16 1144  OT Visit Information  Last OT Received On 11/28/16  Reason Eval/Treat Not Completed Other (comment) (Pt is with PT.)    Will reattempt as able.   Raynald KempKathryn Kizer Nobbe OTR/L Pager: [phone]

## 2016-11-28 NOTE — Care Management Note (Signed)
Case Management Note  Patient Details  Name: Jenna Butler MRN: [id number] Date of Birth: 05/26/1951  Subjective/Objective:                    Action/Plan: Pt with orders for [facility] services. CM met with the patient and provided her a list of [place] agencies in [place]. She chose [facility]. Santiago Glad with [facility] notified and accepted the referral. Pt also with orders for walker and 3 in 1. Pt states she has a walker at home and needs a wide 3 in 1. Larene Beach with [facility] notified and will deliver the equipment to the room.   Expected Discharge Date:                  Expected Discharge Plan:  [place]  In-House Referral:     Discharge planning Services  CM Consult  Post Acute Care Choice:  Home Health, Durable Medical Equipment Choice offered to:  Patient, Spouse  DME Arranged:  3-N-1 (Pt states she has a walker at home) DME Agency:  [place]:  PT [place]:  [place]  Status of Service:  Completed, signed off  If discussed at [place] of Stay Meetings, dates discussed:    Additional Comments:  Pollie Friar, RN 11/28/2016, 2:40 PM

## 2016-11-28 NOTE — Progress Notes (Signed)
Patient ID: Jenna Butler, female   DOB: 12/03/1951, 65 y.o.   MRN: [id number] Subjective: Patient reports approp back soreness, no leg pain or NTW  Objective: Vital signs in last 24 hours: Temp:  [97.7 F (36.5 C)-98.9 F (37.2 C)] 98.1 F (36.7 C) (12/01 0529) Pulse Rate:  [96-105] 96 (12/01 0529) Resp:  [13-23] 20 (12/01 0529) BP: (91-126)/(51-63) 119/60 (12/01 0529) SpO2:  [95 %-100 %] 100 % (12/01 0529) Arterial Line BP: (131-142)/(53-66) 131/53 (11/30 2030) Weight:  [128.1 kg (282 lb 6.4 oz)] 128.1 kg (282 lb 6.4 oz) (11/30 2157)  Intake/Output from previous day: 11/30 0701 - 12/01 0700 In: 3985 [I.V.:3400; Blood:335; IV Piggyback:250] Out: 2200 [Urine:1900; Blood:300] Intake/Output this shift: No intake/output data recorded.  Neurologic: Grossly normal  Lab Results: Lab Results  Component Value Date   WBC 10.2 11/28/2016   HGB 9.9 (L) 11/28/2016   HCT 30.5 (L) 11/28/2016   MCV 90.5 11/28/2016   PLT 184 11/28/2016   Lab Results  Component Value Date   INR 1.03 11/12/2016   BMET Lab Results  Component Value Date   NA 138 11/28/2016   K 4.1 11/28/2016   CL 105 11/28/2016   CO2 24 11/28/2016   GLUCOSE 152 (H) 11/28/2016   BUN 27 (H) 11/28/2016   CREATININE 0.98 11/28/2016   CALCIUM 8.7 (L) 11/28/2016    Studies/Results: Dg Lumbar Spine 2-3 Views  Result Date: 11/27/2016 CLINICAL DATA:  PLIF L3 through S1 EXAM: DG C-ARM GT 120 MIN; LUMBAR SPINE - 2-3 VIEW CONTRAST:  None FLUOROSCOPY TIME:  Fluoroscopy Time:  2 minutes 2 seconds Radiation Exposure Index (if provided by the fluoroscopic device): Not available Number of Acquired Spot Images: 3 COMPARISON:  None. FINDINGS: Bilateral pedicle screws from L3 through S1 with interbody spacers noted from L3 through S1 s/p PLIF. Hardware appears to be in place and intact. IMPRESSION: PLIF from L3 through S1. Electronically Signed   By: Tollie Ethavid  Kwon M.D.   On: 11/27/2016 20:44   Dg C-arm Gt 120 Min  Result Date:  11/27/2016 CLINICAL DATA:  PLIF L3 through S1 EXAM: DG C-ARM GT 120 MIN; LUMBAR SPINE - 2-3 VIEW CONTRAST:  None FLUOROSCOPY TIME:  Fluoroscopy Time:  2 minutes 2 seconds Radiation Exposure Index (if provided by the fluoroscopic device): Not available Number of Acquired Spot Images: 3 COMPARISON:  None. FINDINGS: Bilateral pedicle screws from L3 through S1 with interbody spacers noted from L3 through S1 s/p PLIF. Hardware appears to be in place and intact. IMPRESSION: PLIF from L3 through S1. Electronically Signed   By: Tollie Ethavid  Kwon M.D.   On: 11/27/2016 20:44    Assessment/Plan: Doing well, mobilize   LOS: 1 day    Sani Loiseau S 11/28/2016, 10:35 AM

## 2016-11-29 LAB — GLUCOSE, CAPILLARY: GLUCOSE-CAPILLARY: 88 mg/dL (ref 65–99)

## 2016-11-29 MED ORDER — OXYCODONE HCL 5 MG PO TABS: ORAL_TABLET | ORAL | 0 refills | Status: DC

## 2016-11-29 MED ORDER — CYCLOBENZAPRINE HCL 10 MG PO TABS: 5.0000 mg | ORAL_TABLET | Freq: Three times a day (TID) | ORAL | 0 refills | Status: DC | PRN

## 2016-11-29 NOTE — Progress Notes (Signed)
RN discussed discharge instructions with patient including f/u appointment with Dr. Yetta BarreJones, wound care per MD note, pain control. Patient verbalized understanding. Patient waiting for ride home

## 2016-11-29 NOTE — Progress Notes (Signed)
Physical Therapy Treatment Patient Details Name: Elder NegusLinda Riga MRN: [id number] DOB: 08/02/1951 Today's Date: 11/29/2016    History of Present Illness Pt s/p lumbar laminectomy L3-4 L4-5 and L5-S1 bilaterally with PLIF PMHx- COPD,neuropathy, prior back surgeries, DM    PT Comments    Pt is making great progress with mobility toward goals. Stair education completed this session. Patient safe to D/C from a mobility standpoint based on progression towards goals set on PT eval.   Follow Up Recommendations  No PT follow up;Supervision for mobility/OOB     Equipment Recommendations  None recommended by PT    Recommendations for Other Services OT consult     Precautions / Restrictions Precautions Precautions: Back;Fall Precaution Comments: reports multiple falls PTA; pt able to recall 2/3 back precautions without cues/assistance. reviewed no arching with pt. Required Braces or Orthoses: Spinal Brace Spinal Brace: Lumbar corset;Applied in sitting position Restrictions Weight Bearing Restrictions: No    Mobility  Bed Mobility               General bed mobility comments: no observed- pt OOB in chair before and after session.  Transfers Overall transfer level: Needs assistance Equipment used: Rolling walker (2 wheeled) Transfers: Sit to/from Stand Sit to Stand: Supervision         General transfer comment: demo'd safe technique with sit<>stands  Ambulation/Gait Ambulation/Gait assistance: Supervision Ambulation Distance (Feet): 150 Feet Assistive device: Rolling walker (2 wheeled) Gait Pattern/deviations: Step-through pattern;Decreased stride length Gait velocity: decreased Gait velocity interpretation: Below normal speed for age/gender General Gait Details: slow, steady pace with good posture and walker positioning   Stairs Stairs: Yes Stairs assistance: Min assist Stair Management: One rail Left;Step to pattern;Forwards Number of Stairs: 3 General stair  comments: left rail/right HHA to ascend, reverse for descending. no balance issues noted. cues on sequencing with step to pattern.         Cognition Arousal/Alertness: Awake/alert Behavior During Therapy: WFL for tasks assessed/performed Overall Cognitive Status: Within Functional Limits for tasks assessed                       Pertinent Vitals/Pain Pain Assessment: 0-10 Pain Score: 3  Pain Location: back Pain Descriptors / Indicators: Aching;Sore Pain Intervention(s): Limited activity within patient's tolerance;Monitored during session;Premedicated before session;Repositioned     PT Goals (current goals can now be found in the care plan section) Acute Rehab PT Goals Patient Stated Goal: return home with less pain PT Goal Formulation: With patient Time For Goal Achievement: 12/01/16 Potential to Achieve Goals: Good Progress towards PT goals: Progressing toward goals    Frequency    Min 5X/week      PT Plan Current plan remains appropriate    End of Session Equipment Utilized During Treatment: Back brace;Gait belt Activity Tolerance: Patient tolerated treatment well Patient left: in chair;with call bell/phone within reach     Time: [id number] PT Time Calculation (min) (ACUTE ONLY): 23 min  Charges:  $Gait Training: 23-37 mins           Sallyanne KusterBury, Michael Ventresca 11/29/2016, 9:12 AM   Sallyanne KusterKathy Soumya Colson, PTA, CLT Acute Rehab Services [phone]- [phone] 11/29/16, 9:13 AM

## 2016-11-29 NOTE — Discharge Summary (Signed)
Physician Discharge Summary  Patient ID: Jenna NegusLinda Butler MRN: [id number] DOB/AGE: 65/06/1951 65 y.o.  Admit date: 11/27/2016 Discharge date: 11/29/2016  Admission Diagnoses: Postlaminectomy spondylolisthesis and spinal stenosis    Discharge Diagnoses: Same   Discharged Condition: good  Hospital Course: The patient was admitted on 11/27/2016 and taken to the operating room where the patient underwent lumbar interbody fusion L3-4 L4-5 L5-S1. The patient tolerated the procedure well and was taken to the recovery room and then to the floor in stable condition. The hospital course was routine. There were no complications. The wound remained clean dry and intact. Pt had appropriate back soreness. No complaints of leg pain or new N/T/W. The patient remained afebrile with stable vital signs, and tolerated a regular diet. The patient continued to increase activities, and pain was well controlled with oral pain medications.   Consults: None  Significant Diagnostic Studies:  Results for orders placed or performed during the hospital encounter of 11/27/16  Hemoglobin A1c  Result Value Ref Range   Hgb A1c MFr Bld 6.0 (H) 4.8 - 5.6 %   Mean Plasma Glucose 126 mg/dL  CBC  Result Value Ref Range   WBC 8.0 4.0 - 10.5 K/uL   RBC 3.91 3.87 - 5.11 MIL/uL   Hemoglobin 11.6 (L) 12.0 - 15.0 g/dL   HCT 40.935.4 (L) 81.136.0 - 91.446.0 %   MCV 90.5 78.0 - 100.0 fL   MCH 29.7 26.0 - 34.0 pg   MCHC 32.8 30.0 - 36.0 g/dL   RDW 78.215.5 95.611.5 - 21.315.5 %   Platelets 213 150 - 400 K/uL  Basic metabolic panel  Result Value Ref Range   Sodium 139 135 - 145 mmol/L   Potassium 4.4 3.5 - 5.1 mmol/L   Chloride 104 101 - 111 mmol/L   CO2 22 22 - 32 mmol/L   Glucose, Bld 86 65 - 99 mg/dL   BUN 39 (H) 6 - 20 mg/dL   Creatinine, Ser 0.861.32 (H) 0.44 - 1.00 mg/dL   Calcium 57.810.0 8.9 - 46.910.3 mg/dL   GFR calc non Af Amer 41 (L) >60 mL/min   GFR calc Af Amer 48 (L) >60 mL/min   Anion gap 13 5 - 15  Glucose, capillary  Result Value Ref  Range   Glucose-Capillary 102 (H) 65 - 99 mg/dL  Glucose, capillary  Result Value Ref Range   Glucose-Capillary 133 (H) 65 - 99 mg/dL  Basic metabolic panel  Result Value Ref Range   Sodium 138 135 - 145 mmol/L   Potassium 4.1 3.5 - 5.1 mmol/L   Chloride 105 101 - 111 mmol/L   CO2 24 22 - 32 mmol/L   Glucose, Bld 152 (H) 65 - 99 mg/dL   BUN 27 (H) 6 - 20 mg/dL   Creatinine, Ser 6.290.98 0.44 - 1.00 mg/dL   Calcium 8.7 (L) 8.9 - 10.3 mg/dL   GFR calc non Af Amer 59 (L) >60 mL/min   GFR calc Af Amer >60 >60 mL/min   Anion gap 9 5 - 15  CBC  Result Value Ref Range   WBC 10.2 4.0 - 10.5 K/uL   RBC 3.37 (L) 3.87 - 5.11 MIL/uL   Hemoglobin 9.9 (L) 12.0 - 15.0 g/dL   HCT 52.830.5 (L) 41.336.0 - 24.446.0 %   MCV 90.5 78.0 - 100.0 fL   MCH 29.4 26.0 - 34.0 pg   MCHC 32.5 30.0 - 36.0 g/dL   RDW 01.015.4 27.211.5 - 53.615.5 %   Platelets 184 150 - 400  K/uL  Glucose, capillary  Result Value Ref Range   Glucose-Capillary 146 (H) 65 - 99 mg/dL   Comment 1 Notify RN    Comment 2 Document in Chart   Glucose, capillary  Result Value Ref Range   Glucose-Capillary 168 (H) 65 - 99 mg/dL   Comment 1 Notify RN    Comment 2 Document in Chart   Glucose, capillary  Result Value Ref Range   Glucose-Capillary 100 (H) 65 - 99 mg/dL  Glucose, capillary  Result Value Ref Range   Glucose-Capillary 130 (H) 65 - 99 mg/dL  Glucose, capillary  Result Value Ref Range   Glucose-Capillary 88 65 - 99 mg/dL  Type and screen [facility]  Result Value Ref Range   ABO/RH(D) O POS    Antibody Screen NEG    Sample Expiration 11/30/2016    Unit Number [id number]    Blood Component Type RED CELLS,LR    Unit division 00    Status of Unit ISSUED,FINAL    Transfusion Status OK TO TRANSFUSE    Crossmatch Result Compatible   Prepare RBC  Result Value Ref Range   Order Confirmation ORDER PROCESSED BY BLOOD BANK     Chest 2 View  Result Date: 11/12/2016 CLINICAL DATA:  Preop for lumbar surgery.  Productive  cough. EXAM: CHEST  2 VIEW COMPARISON:  Radiographs of October 30, 2014. FINDINGS: The heart size and mediastinal contours are within normal limits. Both lungs are clear. The visualized skeletal structures are unremarkable. IMPRESSION: No active cardiopulmonary disease. Electronically Signed   By: Lupita RaiderJames  Green Jr, M.D.   On: 11/12/2016 14:00   Dg Lumbar Spine 2-3 Views  Result Date: 11/27/2016 CLINICAL DATA:  PLIF L3 through S1 EXAM: DG C-ARM GT 120 MIN; LUMBAR SPINE - 2-3 VIEW CONTRAST:  None FLUOROSCOPY TIME:  Fluoroscopy Time:  2 minutes 2 seconds Radiation Exposure Index (if provided by the fluoroscopic device): Not available Number of Acquired Spot Images: 3 COMPARISON:  None. FINDINGS: Bilateral pedicle screws from L3 through S1 with interbody spacers noted from L3 through S1 s/p PLIF. Hardware appears to be in place and intact. IMPRESSION: PLIF from L3 through S1. Electronically Signed   By: Tollie Ethavid  Kwon M.D.   On: 11/27/2016 20:44   Dg C-arm Gt 120 Min  Result Date: 11/27/2016 CLINICAL DATA:  PLIF L3 through S1 EXAM: DG C-ARM GT 120 MIN; LUMBAR SPINE - 2-3 VIEW CONTRAST:  None FLUOROSCOPY TIME:  Fluoroscopy Time:  2 minutes 2 seconds Radiation Exposure Index (if provided by the fluoroscopic device): Not available Number of Acquired Spot Images: 3 COMPARISON:  None. FINDINGS: Bilateral pedicle screws from L3 through S1 with interbody spacers noted from L3 through S1 s/p PLIF. Hardware appears to be in place and intact. IMPRESSION: PLIF from L3 through S1. Electronically Signed   By: Tollie Ethavid  Kwon M.D.   On: 11/27/2016 20:44    Antibiotics:  Anti-infectives    Start     Dose/Rate Route Frequency Ordered Stop   11/28/16 0000  vancomycin (VANCOCIN) IVPB 1000 mg/200 mL premix     1,000 mg 200 mL/hr over 60 Minutes Intravenous  Once 11/27/16 2332 11/28/16 0113   11/27/16 1856  vancomycin (VANCOCIN) powder  Status:  Discontinued       As needed 11/27/16 1856 11/27/16 1939   11/27/16 1330   vancomycin (VANCOCIN) 1,500 mg in sodium chloride 0.9 % 500 mL IVPB     1,500 mg 250 mL/hr over 120 Minutes Intravenous To Surgery  11/27/16 1320 11/27/16 1355   11/27/16 1330  bacitracin 50,000 Units in sodium chloride irrigation 0.9 % 500 mL irrigation  Status:  Discontinued       As needed 11/27/16 1501 11/27/16 1939   11/17/16 0600  vancomycin (VANCOCIN) 1,500 mg in sodium chloride 0.9 % 500 mL IVPB     1,500 mg 250 mL/hr over 120 Minutes Intravenous To [facility]Stay Surgical 11/14/16 0736 11/18/16 0600      Discharge Exam: Blood pressure 122/61, pulse 100, temperature 97.8 F (36.6 C), temperature source Oral, resp. rate 20, height 5\' 6"  (1.676 m), weight 128.1 kg (282 lb 6.4 oz), SpO2 98 %. Neurologic: Grossly normal Dressing dry  Discharge Medications:     Medication List    TAKE these medications   albuterol 108 (90 Base) MCG/ACT inhaler Commonly known as:  PROVENTIL HFA;VENTOLIN HFA Inhale 2 puffs into the lungs every 6 (six) hours as needed for wheezing or shortness of breath.   aspirin EC 81 MG tablet Take 81 mg by mouth daily.   busPIRone 15 MG tablet Commonly known as:  BUSPAR Take 15-30 mg by mouth 2 (two) times daily. Take 15 mg in the morning and 30 mg in the evening.   CALCIUM 600 PO Take 1 tablet by mouth daily.   clonazePAM 0.5 MG tablet Commonly known as:  KLONOPIN Take 1 mg by mouth every evening.   cyclobenzaprine 10 MG tablet Commonly known as:  FLEXERIL Take 0.5-1 tablets (5-10 mg total) by mouth 3 (three) times daily as needed for muscle spasms.   doxycycline 100 MG capsule Commonly known as:  VIBRAMYCIN Take 100 mg by mouth 2 (two) times daily.   furosemide 20 MG tablet Commonly known as:  LASIX Take 20 mg by mouth daily.   gabapentin 300 MG capsule Commonly known as:  NEURONTIN Take 600 mg by mouth 4 (four) times daily.   gemfibrozil 600 MG tablet Commonly known as:  LOPID Take 600 mg by mouth 2 (two) times daily before a meal.    glipiZIDE 10 MG tablet Commonly known as:  GLUCOTROL Take 10 mg by mouth 2 (two) times daily before a meal.   glucose 4 GM chewable tablet Chew 1 tablet by mouth as needed for low blood sugar.   insulin glargine 100 UNIT/ML injection Commonly known as:  LANTUS Inject 21 Units into the skin at bedtime.   lamoTRIgine 150 MG tablet Commonly known as:  LAMICTAL Take 150 mg by mouth 2 (two) times daily.   levothyroxine 200 MCG tablet Commonly known as:  SYNTHROID, LEVOTHROID Take 200 mcg by mouth daily before breakfast.   lisinopril 20 MG tablet Commonly known as:  PRINIVIL,ZESTRIL Take 10 mg by mouth daily.   metoprolol succinate 25 MG 24 hr tablet Commonly known as:  TOPROL-XL Take 25 mg by mouth daily.   montelukast 10 MG tablet Commonly known as:  SINGULAIR Take 10 mg by mouth every morning.   oxyCODONE 5 MG immediate release tablet Commonly known as:  Oxy IR/ROXICODONE take 1 to 2 tablets by mouth every 6 hours if needed for pain What changed:  See the new instructions.   phenytoin 100 MG ER capsule Commonly known as:  DILANTIN Take 100 mg by mouth 3 (three) times daily.   traZODone 150 MG tablet Commonly known as:  DESYREL Take 150 mg by mouth at bedtime.   triamcinolone cream 0.1 % Commonly known as:  KENALOG Apply 1 application topically 2 (two) times daily as needed.  triamterene-hydrochlorothiazide 37.5-25 MG capsule Commonly known as:  DYAZIDE Take 1 capsule by mouth every evening.   venlafaxine XR 75 MG 24 hr capsule Commonly known as:  EFFEXOR-XR Take 75 mg by mouth daily with breakfast.   venlafaxine XR 150 MG 24 hr capsule Commonly known as:  EFFEXOR-XR Take 150 mg by mouth daily with breakfast.   Vitamin D3 5000 units Tabs Take 1 tablet by mouth 2 (two) times daily.            Durable Medical Equipment        Start     Ordered   11/28/16 1435  DME 3 n 1  Once    Comments:  Wide size   11/28/16 1434   11/27/16 2255  DME Walker  rolling  Once    Question:  Patient needs a walker to treat with the following condition  Answer:  S/P lumbar fusion   11/27/16 2254      Disposition: Home   Final Dx: 3 level lumbar fusion  Discharge Instructions    Call MD for:  difficulty breathing, headache or visual disturbances    Complete by:  As directed    Call MD for:  persistant nausea and vomiting    Complete by:  As directed    Call MD for:  redness, tenderness, or signs of infection (pain, swelling, redness, odor or green/yellow discharge around incision site)    Complete by:  As directed    Call MD for:  severe uncontrolled pain    Complete by:  As directed    Call MD for:  temperature >100.4    Complete by:  As directed    Diet - low sodium heart healthy    Complete by:  As directed    Discharge instructions    Complete by:  As directed    May shower, no heavy lifting, no driving, no strenuous activity   Increase activity slowly    Complete by:  As directed    Remove dressing in 48 hours    Complete by:  As directed          Signed: Yvonnia Tango S 11/29/2016, 7:23 AM

## 2016-12-01 DIAGNOSIS — M797 Fibromyalgia: Secondary | ICD-10-CM | POA: Diagnosis not present

## 2016-12-01 DIAGNOSIS — Z4789 Encounter for other orthopedic aftercare: Secondary | ICD-10-CM | POA: Diagnosis not present

## 2016-12-01 DIAGNOSIS — D649 Anemia, unspecified: Secondary | ICD-10-CM | POA: Diagnosis not present

## 2016-12-01 DIAGNOSIS — J449 Chronic obstructive pulmonary disease, unspecified: Secondary | ICD-10-CM | POA: Diagnosis not present

## 2016-12-01 DIAGNOSIS — F419 Anxiety disorder, unspecified: Secondary | ICD-10-CM | POA: Diagnosis not present

## 2016-12-01 DIAGNOSIS — J45909 Unspecified asthma, uncomplicated: Secondary | ICD-10-CM | POA: Diagnosis not present

## 2016-12-01 DIAGNOSIS — E1142 Type 2 diabetes mellitus with diabetic polyneuropathy: Secondary | ICD-10-CM | POA: Diagnosis not present

## 2016-12-01 DIAGNOSIS — Z7982 Long term (current) use of aspirin: Secondary | ICD-10-CM | POA: Diagnosis not present

## 2016-12-01 DIAGNOSIS — I2510 Atherosclerotic heart disease of native coronary artery without angina pectoris: Secondary | ICD-10-CM | POA: Diagnosis not present

## 2016-12-01 DIAGNOSIS — I10 Essential (primary) hypertension: Secondary | ICD-10-CM | POA: Diagnosis not present

## 2016-12-01 DIAGNOSIS — Z794 Long term (current) use of insulin: Secondary | ICD-10-CM | POA: Diagnosis not present

## 2016-12-01 LAB — POCT I-STAT 4, (NA,K, GLUC, HGB,HCT)
GLUCOSE: 59 mg/dL — AB (ref 65–99)
GLUCOSE: 98 mg/dL (ref 65–99)
GLUCOSE: 114 mg/dL — AB (ref 65–99)
Glucose, Bld: 100 mg/dL — ABNORMAL HIGH (ref 65–99)
HCT: 27 % — ABNORMAL LOW (ref 36.0–46.0)
HCT: 25 % — ABNORMAL LOW (ref 36.0–46.0)
HEMATOCRIT: 27 % — AB (ref 36.0–46.0)
HEMATOCRIT: 26 % — AB (ref 36.0–46.0)
HEMOGLOBIN: 9.2 g/dL — AB (ref 12.0–15.0)
HEMOGLOBIN: 8.8 g/dL — AB (ref 12.0–15.0)
Hemoglobin: 9.2 g/dL — ABNORMAL LOW (ref 12.0–15.0)
Hemoglobin: 8.5 g/dL — ABNORMAL LOW (ref 12.0–15.0)
POTASSIUM: 3.8 mmol/L (ref 3.5–5.1)
POTASSIUM: 4.3 mmol/L (ref 3.5–5.1)
Potassium: 4 mmol/L (ref 3.5–5.1)
Potassium: 4.7 mmol/L (ref 3.5–5.1)
SODIUM: 138 mmol/L (ref 135–145)
SODIUM: 137 mmol/L (ref 135–145)
Sodium: 138 mmol/L (ref 135–145)
Sodium: 137 mmol/L (ref 135–145)

## 2016-12-02 ENCOUNTER — Encounter (HOSPITAL_COMMUNITY): Payer: Self-pay | Admitting: Neurological Surgery

## 2016-12-04 DIAGNOSIS — J45909 Unspecified asthma, uncomplicated: Secondary | ICD-10-CM | POA: Diagnosis not present

## 2016-12-04 DIAGNOSIS — E1142 Type 2 diabetes mellitus with diabetic polyneuropathy: Secondary | ICD-10-CM | POA: Diagnosis not present

## 2016-12-04 DIAGNOSIS — J449 Chronic obstructive pulmonary disease, unspecified: Secondary | ICD-10-CM | POA: Diagnosis not present

## 2016-12-04 DIAGNOSIS — I10 Essential (primary) hypertension: Secondary | ICD-10-CM | POA: Diagnosis not present

## 2016-12-04 DIAGNOSIS — Z4789 Encounter for other orthopedic aftercare: Secondary | ICD-10-CM | POA: Diagnosis not present

## 2016-12-04 DIAGNOSIS — I2510 Atherosclerotic heart disease of native coronary artery without angina pectoris: Secondary | ICD-10-CM | POA: Diagnosis not present

## 2016-12-08 DIAGNOSIS — E1142 Type 2 diabetes mellitus with diabetic polyneuropathy: Secondary | ICD-10-CM | POA: Diagnosis not present

## 2016-12-08 DIAGNOSIS — I10 Essential (primary) hypertension: Secondary | ICD-10-CM | POA: Diagnosis not present

## 2016-12-08 DIAGNOSIS — J45909 Unspecified asthma, uncomplicated: Secondary | ICD-10-CM | POA: Diagnosis not present

## 2016-12-08 DIAGNOSIS — I2510 Atherosclerotic heart disease of native coronary artery without angina pectoris: Secondary | ICD-10-CM | POA: Diagnosis not present

## 2016-12-08 DIAGNOSIS — Z4789 Encounter for other orthopedic aftercare: Secondary | ICD-10-CM | POA: Diagnosis not present

## 2016-12-08 DIAGNOSIS — J449 Chronic obstructive pulmonary disease, unspecified: Secondary | ICD-10-CM | POA: Diagnosis not present

## 2016-12-12 ENCOUNTER — Other Ambulatory Visit: Payer: Self-pay | Admitting: Neurological Surgery

## 2016-12-12 DIAGNOSIS — E1142 Type 2 diabetes mellitus with diabetic polyneuropathy: Secondary | ICD-10-CM | POA: Diagnosis not present

## 2016-12-12 DIAGNOSIS — M4317 Spondylolisthesis, lumbosacral region: Secondary | ICD-10-CM

## 2016-12-12 DIAGNOSIS — J45909 Unspecified asthma, uncomplicated: Secondary | ICD-10-CM | POA: Diagnosis not present

## 2016-12-12 DIAGNOSIS — I2510 Atherosclerotic heart disease of native coronary artery without angina pectoris: Secondary | ICD-10-CM | POA: Diagnosis not present

## 2016-12-12 DIAGNOSIS — I10 Essential (primary) hypertension: Secondary | ICD-10-CM | POA: Diagnosis not present

## 2016-12-12 DIAGNOSIS — Z4789 Encounter for other orthopedic aftercare: Secondary | ICD-10-CM | POA: Diagnosis not present

## 2016-12-12 DIAGNOSIS — J449 Chronic obstructive pulmonary disease, unspecified: Secondary | ICD-10-CM | POA: Diagnosis not present

## 2016-12-15 DIAGNOSIS — Z4789 Encounter for other orthopedic aftercare: Secondary | ICD-10-CM | POA: Diagnosis not present

## 2016-12-15 DIAGNOSIS — E1142 Type 2 diabetes mellitus with diabetic polyneuropathy: Secondary | ICD-10-CM | POA: Diagnosis not present

## 2016-12-15 DIAGNOSIS — J449 Chronic obstructive pulmonary disease, unspecified: Secondary | ICD-10-CM | POA: Diagnosis not present

## 2016-12-15 DIAGNOSIS — I10 Essential (primary) hypertension: Secondary | ICD-10-CM | POA: Diagnosis not present

## 2016-12-15 DIAGNOSIS — I2510 Atherosclerotic heart disease of native coronary artery without angina pectoris: Secondary | ICD-10-CM | POA: Diagnosis not present

## 2016-12-15 DIAGNOSIS — J45909 Unspecified asthma, uncomplicated: Secondary | ICD-10-CM | POA: Diagnosis not present

## 2016-12-26 DIAGNOSIS — J45909 Unspecified asthma, uncomplicated: Secondary | ICD-10-CM | POA: Diagnosis not present

## 2016-12-26 DIAGNOSIS — I2510 Atherosclerotic heart disease of native coronary artery without angina pectoris: Secondary | ICD-10-CM | POA: Diagnosis not present

## 2016-12-26 DIAGNOSIS — Z4789 Encounter for other orthopedic aftercare: Secondary | ICD-10-CM | POA: Diagnosis not present

## 2016-12-26 DIAGNOSIS — I10 Essential (primary) hypertension: Secondary | ICD-10-CM | POA: Diagnosis not present

## 2016-12-26 DIAGNOSIS — J449 Chronic obstructive pulmonary disease, unspecified: Secondary | ICD-10-CM | POA: Diagnosis not present

## 2016-12-26 DIAGNOSIS — E1142 Type 2 diabetes mellitus with diabetic polyneuropathy: Secondary | ICD-10-CM | POA: Diagnosis not present

## 2016-12-31 DIAGNOSIS — I2510 Atherosclerotic heart disease of native coronary artery without angina pectoris: Secondary | ICD-10-CM | POA: Diagnosis not present

## 2016-12-31 DIAGNOSIS — Z4789 Encounter for other orthopedic aftercare: Secondary | ICD-10-CM | POA: Diagnosis not present

## 2016-12-31 DIAGNOSIS — J45909 Unspecified asthma, uncomplicated: Secondary | ICD-10-CM | POA: Diagnosis not present

## 2016-12-31 DIAGNOSIS — E1142 Type 2 diabetes mellitus with diabetic polyneuropathy: Secondary | ICD-10-CM | POA: Diagnosis not present

## 2016-12-31 DIAGNOSIS — I10 Essential (primary) hypertension: Secondary | ICD-10-CM | POA: Diagnosis not present

## 2016-12-31 DIAGNOSIS — J449 Chronic obstructive pulmonary disease, unspecified: Secondary | ICD-10-CM | POA: Diagnosis not present

## 2017-01-06 ENCOUNTER — Ambulatory Visit (INDEPENDENT_AMBULATORY_CARE_PROVIDER_SITE_OTHER): Payer: Medicare Other

## 2017-01-06 DIAGNOSIS — Z981 Arthrodesis status: Secondary | ICD-10-CM | POA: Diagnosis not present

## 2017-01-06 DIAGNOSIS — M4327 Fusion of spine, lumbosacral region: Secondary | ICD-10-CM

## 2017-01-06 DIAGNOSIS — M4317 Spondylolisthesis, lumbosacral region: Secondary | ICD-10-CM

## 2017-01-08 ENCOUNTER — Other Ambulatory Visit: Payer: Self-pay | Admitting: Neurological Surgery

## 2017-01-08 DIAGNOSIS — M4317 Spondylolisthesis, lumbosacral region: Secondary | ICD-10-CM

## 2017-01-08 DIAGNOSIS — J069 Acute upper respiratory infection, unspecified: Secondary | ICD-10-CM | POA: Diagnosis not present

## 2017-01-08 DIAGNOSIS — E538 Deficiency of other specified B group vitamins: Secondary | ICD-10-CM | POA: Diagnosis not present

## 2017-01-08 DIAGNOSIS — J9801 Acute bronchospasm: Secondary | ICD-10-CM | POA: Diagnosis not present

## 2017-02-06 DIAGNOSIS — E119 Type 2 diabetes mellitus without complications: Secondary | ICD-10-CM | POA: Diagnosis not present

## 2017-02-06 DIAGNOSIS — Z794 Long term (current) use of insulin: Secondary | ICD-10-CM | POA: Diagnosis not present

## 2017-02-06 DIAGNOSIS — H25813 Combined forms of age-related cataract, bilateral: Secondary | ICD-10-CM | POA: Diagnosis not present

## 2017-02-06 DIAGNOSIS — H527 Unspecified disorder of refraction: Secondary | ICD-10-CM | POA: Diagnosis not present

## 2017-03-04 DIAGNOSIS — E538 Deficiency of other specified B group vitamins: Secondary | ICD-10-CM | POA: Diagnosis not present

## 2017-03-04 DIAGNOSIS — D649 Anemia, unspecified: Secondary | ICD-10-CM | POA: Diagnosis not present

## 2017-03-04 DIAGNOSIS — I10 Essential (primary) hypertension: Secondary | ICD-10-CM | POA: Diagnosis not present

## 2017-03-04 DIAGNOSIS — E785 Hyperlipidemia, unspecified: Secondary | ICD-10-CM | POA: Diagnosis not present

## 2017-03-04 DIAGNOSIS — E039 Hypothyroidism, unspecified: Secondary | ICD-10-CM | POA: Diagnosis not present

## 2017-03-04 DIAGNOSIS — E1169 Type 2 diabetes mellitus with other specified complication: Secondary | ICD-10-CM | POA: Diagnosis not present

## 2017-03-11 DIAGNOSIS — F4310 Post-traumatic stress disorder, unspecified: Secondary | ICD-10-CM | POA: Diagnosis not present

## 2017-03-11 DIAGNOSIS — G4700 Insomnia, unspecified: Secondary | ICD-10-CM | POA: Diagnosis not present

## 2017-03-11 DIAGNOSIS — F331 Major depressive disorder, recurrent, moderate: Secondary | ICD-10-CM | POA: Diagnosis not present

## 2017-04-14 ENCOUNTER — Ambulatory Visit (INDEPENDENT_AMBULATORY_CARE_PROVIDER_SITE_OTHER): Payer: Medicare Other

## 2017-04-14 DIAGNOSIS — S3992XA Unspecified injury of lower back, initial encounter: Secondary | ICD-10-CM | POA: Diagnosis not present

## 2017-04-14 DIAGNOSIS — M4327 Fusion of spine, lumbosacral region: Secondary | ICD-10-CM

## 2017-04-14 DIAGNOSIS — M5416 Radiculopathy, lumbar region: Secondary | ICD-10-CM

## 2017-04-14 DIAGNOSIS — M4317 Spondylolisthesis, lumbosacral region: Secondary | ICD-10-CM | POA: Diagnosis not present

## 2017-04-23 DIAGNOSIS — R32 Unspecified urinary incontinence: Secondary | ICD-10-CM | POA: Diagnosis not present

## 2017-04-23 DIAGNOSIS — R413 Other amnesia: Secondary | ICD-10-CM | POA: Diagnosis not present

## 2017-04-23 DIAGNOSIS — M5441 Lumbago with sciatica, right side: Secondary | ICD-10-CM | POA: Diagnosis not present

## 2017-04-23 DIAGNOSIS — G43009 Migraine without aura, not intractable, without status migrainosus: Secondary | ICD-10-CM | POA: Diagnosis not present

## 2017-04-23 DIAGNOSIS — G5603 Carpal tunnel syndrome, bilateral upper limbs: Secondary | ICD-10-CM | POA: Diagnosis not present

## 2017-04-23 DIAGNOSIS — M5442 Lumbago with sciatica, left side: Secondary | ICD-10-CM | POA: Diagnosis not present

## 2017-04-28 DIAGNOSIS — G4700 Insomnia, unspecified: Secondary | ICD-10-CM | POA: Diagnosis not present

## 2017-04-28 DIAGNOSIS — F331 Major depressive disorder, recurrent, moderate: Secondary | ICD-10-CM | POA: Diagnosis not present

## 2017-04-28 DIAGNOSIS — F4310 Post-traumatic stress disorder, unspecified: Secondary | ICD-10-CM | POA: Diagnosis not present

## 2017-05-04 DIAGNOSIS — I10 Essential (primary) hypertension: Secondary | ICD-10-CM | POA: Diagnosis not present

## 2017-05-04 DIAGNOSIS — Z9989 Dependence on other enabling machines and devices: Secondary | ICD-10-CM | POA: Diagnosis not present

## 2017-05-04 DIAGNOSIS — G4733 Obstructive sleep apnea (adult) (pediatric): Secondary | ICD-10-CM | POA: Diagnosis not present

## 2017-05-05 DIAGNOSIS — E119 Type 2 diabetes mellitus without complications: Secondary | ICD-10-CM | POA: Diagnosis not present

## 2017-05-05 DIAGNOSIS — H25013 Cortical age-related cataract, bilateral: Secondary | ICD-10-CM | POA: Diagnosis not present

## 2017-05-05 DIAGNOSIS — H2513 Age-related nuclear cataract, bilateral: Secondary | ICD-10-CM | POA: Diagnosis not present

## 2017-05-06 DIAGNOSIS — R9082 White matter disease, unspecified: Secondary | ICD-10-CM | POA: Diagnosis not present

## 2017-05-06 DIAGNOSIS — R413 Other amnesia: Secondary | ICD-10-CM | POA: Diagnosis not present

## 2017-05-06 DIAGNOSIS — R296 Repeated falls: Secondary | ICD-10-CM | POA: Diagnosis not present

## 2017-06-10 DIAGNOSIS — M5412 Radiculopathy, cervical region: Secondary | ICD-10-CM | POA: Diagnosis not present

## 2017-06-10 DIAGNOSIS — Z79899 Other long term (current) drug therapy: Secondary | ICD-10-CM | POA: Diagnosis not present

## 2017-06-10 DIAGNOSIS — G603 Idiopathic progressive neuropathy: Secondary | ICD-10-CM | POA: Diagnosis not present

## 2017-06-10 DIAGNOSIS — R32 Unspecified urinary incontinence: Secondary | ICD-10-CM | POA: Diagnosis not present

## 2017-06-10 DIAGNOSIS — G5603 Carpal tunnel syndrome, bilateral upper limbs: Secondary | ICD-10-CM | POA: Diagnosis not present

## 2017-06-10 DIAGNOSIS — G5623 Lesion of ulnar nerve, bilateral upper limbs: Secondary | ICD-10-CM | POA: Diagnosis not present

## 2017-06-10 DIAGNOSIS — G43009 Migraine without aura, not intractable, without status migrainosus: Secondary | ICD-10-CM | POA: Diagnosis not present

## 2017-06-10 DIAGNOSIS — M5417 Radiculopathy, lumbosacral region: Secondary | ICD-10-CM | POA: Diagnosis not present

## 2017-06-18 DIAGNOSIS — I10 Essential (primary) hypertension: Secondary | ICD-10-CM | POA: Diagnosis not present

## 2017-06-18 DIAGNOSIS — E785 Hyperlipidemia, unspecified: Secondary | ICD-10-CM | POA: Diagnosis not present

## 2017-06-18 DIAGNOSIS — Z888 Allergy status to other drugs, medicaments and biological substances status: Secondary | ICD-10-CM | POA: Diagnosis not present

## 2017-06-18 DIAGNOSIS — Z87891 Personal history of nicotine dependence: Secondary | ICD-10-CM | POA: Diagnosis not present

## 2017-06-18 DIAGNOSIS — E119 Type 2 diabetes mellitus without complications: Secondary | ICD-10-CM | POA: Diagnosis not present

## 2017-06-18 DIAGNOSIS — Z79899 Other long term (current) drug therapy: Secondary | ICD-10-CM | POA: Diagnosis not present

## 2017-06-18 DIAGNOSIS — Z881 Allergy status to other antibiotic agents status: Secondary | ICD-10-CM | POA: Diagnosis not present

## 2017-06-18 DIAGNOSIS — G2581 Restless legs syndrome: Secondary | ICD-10-CM | POA: Diagnosis not present

## 2017-06-18 DIAGNOSIS — Z955 Presence of coronary angioplasty implant and graft: Secondary | ICD-10-CM | POA: Diagnosis not present

## 2017-06-18 DIAGNOSIS — Z794 Long term (current) use of insulin: Secondary | ICD-10-CM | POA: Diagnosis not present

## 2017-06-18 DIAGNOSIS — G4733 Obstructive sleep apnea (adult) (pediatric): Secondary | ICD-10-CM | POA: Diagnosis not present

## 2017-06-18 DIAGNOSIS — Z7982 Long term (current) use of aspirin: Secondary | ICD-10-CM | POA: Diagnosis not present

## 2017-06-18 DIAGNOSIS — J449 Chronic obstructive pulmonary disease, unspecified: Secondary | ICD-10-CM | POA: Diagnosis not present

## 2017-06-18 DIAGNOSIS — I2510 Atherosclerotic heart disease of native coronary artery without angina pectoris: Secondary | ICD-10-CM | POA: Diagnosis not present

## 2017-06-18 DIAGNOSIS — K219 Gastro-esophageal reflux disease without esophagitis: Secondary | ICD-10-CM | POA: Diagnosis not present

## 2017-06-18 DIAGNOSIS — L600 Ingrowing nail: Secondary | ICD-10-CM | POA: Diagnosis not present

## 2017-06-30 DIAGNOSIS — Z794 Long term (current) use of insulin: Secondary | ICD-10-CM | POA: Diagnosis not present

## 2017-06-30 DIAGNOSIS — M545 Low back pain: Secondary | ICD-10-CM | POA: Diagnosis not present

## 2017-06-30 DIAGNOSIS — K219 Gastro-esophageal reflux disease without esophagitis: Secondary | ICD-10-CM | POA: Diagnosis not present

## 2017-06-30 DIAGNOSIS — J449 Chronic obstructive pulmonary disease, unspecified: Secondary | ICD-10-CM | POA: Diagnosis not present

## 2017-06-30 DIAGNOSIS — Z79899 Other long term (current) drug therapy: Secondary | ICD-10-CM | POA: Diagnosis not present

## 2017-06-30 DIAGNOSIS — M5432 Sciatica, left side: Secondary | ICD-10-CM | POA: Diagnosis not present

## 2017-06-30 DIAGNOSIS — Z91048 Other nonmedicinal substance allergy status: Secondary | ICD-10-CM | POA: Diagnosis not present

## 2017-06-30 DIAGNOSIS — S300XXA Contusion of lower back and pelvis, initial encounter: Secondary | ICD-10-CM | POA: Diagnosis not present

## 2017-06-30 DIAGNOSIS — Z955 Presence of coronary angioplasty implant and graft: Secondary | ICD-10-CM | POA: Diagnosis not present

## 2017-06-30 DIAGNOSIS — G4733 Obstructive sleep apnea (adult) (pediatric): Secondary | ICD-10-CM | POA: Diagnosis not present

## 2017-06-30 DIAGNOSIS — Z888 Allergy status to other drugs, medicaments and biological substances status: Secondary | ICD-10-CM | POA: Diagnosis not present

## 2017-06-30 DIAGNOSIS — Z7982 Long term (current) use of aspirin: Secondary | ICD-10-CM | POA: Diagnosis not present

## 2017-06-30 DIAGNOSIS — M5442 Lumbago with sciatica, left side: Secondary | ICD-10-CM | POA: Diagnosis not present

## 2017-06-30 DIAGNOSIS — Z87891 Personal history of nicotine dependence: Secondary | ICD-10-CM | POA: Diagnosis not present

## 2017-06-30 DIAGNOSIS — Z7951 Long term (current) use of inhaled steroids: Secondary | ICD-10-CM | POA: Diagnosis not present

## 2017-06-30 DIAGNOSIS — E119 Type 2 diabetes mellitus without complications: Secondary | ICD-10-CM | POA: Diagnosis not present

## 2017-06-30 DIAGNOSIS — E785 Hyperlipidemia, unspecified: Secondary | ICD-10-CM | POA: Diagnosis not present

## 2017-06-30 DIAGNOSIS — I10 Essential (primary) hypertension: Secondary | ICD-10-CM | POA: Diagnosis not present

## 2017-06-30 DIAGNOSIS — I2510 Atherosclerotic heart disease of native coronary artery without angina pectoris: Secondary | ICD-10-CM | POA: Diagnosis not present

## 2017-06-30 DIAGNOSIS — W108XXA Fall (on) (from) other stairs and steps, initial encounter: Secondary | ICD-10-CM | POA: Diagnosis not present

## 2017-06-30 DIAGNOSIS — Z880 Allergy status to penicillin: Secondary | ICD-10-CM | POA: Diagnosis not present

## 2017-07-15 DIAGNOSIS — H2511 Age-related nuclear cataract, right eye: Secondary | ICD-10-CM | POA: Diagnosis not present

## 2017-07-21 DIAGNOSIS — J069 Acute upper respiratory infection, unspecified: Secondary | ICD-10-CM | POA: Diagnosis not present

## 2017-07-21 DIAGNOSIS — J9801 Acute bronchospasm: Secondary | ICD-10-CM | POA: Diagnosis not present

## 2017-07-24 DIAGNOSIS — D649 Anemia, unspecified: Secondary | ICD-10-CM | POA: Diagnosis not present

## 2017-07-24 DIAGNOSIS — E1140 Type 2 diabetes mellitus with diabetic neuropathy, unspecified: Secondary | ICD-10-CM | POA: Diagnosis not present

## 2017-07-24 DIAGNOSIS — Z23 Encounter for immunization: Secondary | ICD-10-CM | POA: Diagnosis not present

## 2017-07-24 DIAGNOSIS — E039 Hypothyroidism, unspecified: Secondary | ICD-10-CM | POA: Diagnosis not present

## 2017-07-24 DIAGNOSIS — E538 Deficiency of other specified B group vitamins: Secondary | ICD-10-CM | POA: Diagnosis not present

## 2017-07-24 DIAGNOSIS — E785 Hyperlipidemia, unspecified: Secondary | ICD-10-CM | POA: Diagnosis not present

## 2017-07-24 DIAGNOSIS — I10 Essential (primary) hypertension: Secondary | ICD-10-CM | POA: Diagnosis not present

## 2017-07-24 DIAGNOSIS — E1169 Type 2 diabetes mellitus with other specified complication: Secondary | ICD-10-CM | POA: Diagnosis not present

## 2017-07-29 DIAGNOSIS — H2512 Age-related nuclear cataract, left eye: Secondary | ICD-10-CM | POA: Diagnosis not present

## 2017-07-29 DIAGNOSIS — H25811 Combined forms of age-related cataract, right eye: Secondary | ICD-10-CM | POA: Diagnosis not present

## 2017-07-29 DIAGNOSIS — H25812 Combined forms of age-related cataract, left eye: Secondary | ICD-10-CM | POA: Diagnosis not present

## 2017-08-04 DIAGNOSIS — F4310 Post-traumatic stress disorder, unspecified: Secondary | ICD-10-CM | POA: Diagnosis not present

## 2017-08-04 DIAGNOSIS — G4700 Insomnia, unspecified: Secondary | ICD-10-CM | POA: Diagnosis not present

## 2017-08-04 DIAGNOSIS — F331 Major depressive disorder, recurrent, moderate: Secondary | ICD-10-CM | POA: Diagnosis not present

## 2017-08-12 DIAGNOSIS — E538 Deficiency of other specified B group vitamins: Secondary | ICD-10-CM | POA: Diagnosis not present

## 2017-08-13 DIAGNOSIS — G603 Idiopathic progressive neuropathy: Secondary | ICD-10-CM | POA: Diagnosis not present

## 2017-08-13 DIAGNOSIS — M5442 Lumbago with sciatica, left side: Secondary | ICD-10-CM | POA: Diagnosis not present

## 2017-08-13 DIAGNOSIS — G5603 Carpal tunnel syndrome, bilateral upper limbs: Secondary | ICD-10-CM | POA: Diagnosis not present

## 2017-08-13 DIAGNOSIS — M5441 Lumbago with sciatica, right side: Secondary | ICD-10-CM | POA: Diagnosis not present

## 2017-08-13 DIAGNOSIS — G43009 Migraine without aura, not intractable, without status migrainosus: Secondary | ICD-10-CM | POA: Diagnosis not present

## 2017-09-01 DIAGNOSIS — G4700 Insomnia, unspecified: Secondary | ICD-10-CM | POA: Diagnosis not present

## 2017-09-01 DIAGNOSIS — F331 Major depressive disorder, recurrent, moderate: Secondary | ICD-10-CM | POA: Diagnosis not present

## 2017-09-01 DIAGNOSIS — F4310 Post-traumatic stress disorder, unspecified: Secondary | ICD-10-CM | POA: Diagnosis not present

## 2017-09-07 DIAGNOSIS — Z23 Encounter for immunization: Secondary | ICD-10-CM | POA: Diagnosis not present

## 2017-09-15 DIAGNOSIS — F4323 Adjustment disorder with mixed anxiety and depressed mood: Secondary | ICD-10-CM | POA: Diagnosis not present

## 2017-09-30 DIAGNOSIS — I252 Old myocardial infarction: Secondary | ICD-10-CM | POA: Diagnosis not present

## 2017-09-30 DIAGNOSIS — Z955 Presence of coronary angioplasty implant and graft: Secondary | ICD-10-CM | POA: Diagnosis not present

## 2017-09-30 DIAGNOSIS — I10 Essential (primary) hypertension: Secondary | ICD-10-CM | POA: Diagnosis not present

## 2017-09-30 DIAGNOSIS — R079 Chest pain, unspecified: Secondary | ICD-10-CM | POA: Diagnosis not present

## 2017-09-30 DIAGNOSIS — I2510 Atherosclerotic heart disease of native coronary artery without angina pectoris: Secondary | ICD-10-CM | POA: Diagnosis not present

## 2017-10-06 DIAGNOSIS — E039 Hypothyroidism, unspecified: Secondary | ICD-10-CM | POA: Diagnosis not present

## 2017-10-06 DIAGNOSIS — R5383 Other fatigue: Secondary | ICD-10-CM | POA: Diagnosis not present

## 2017-10-06 DIAGNOSIS — F4310 Post-traumatic stress disorder, unspecified: Secondary | ICD-10-CM | POA: Diagnosis not present

## 2017-10-06 DIAGNOSIS — Z1231 Encounter for screening mammogram for malignant neoplasm of breast: Secondary | ICD-10-CM | POA: Diagnosis not present

## 2017-10-06 DIAGNOSIS — G4700 Insomnia, unspecified: Secondary | ICD-10-CM | POA: Diagnosis not present

## 2017-10-06 DIAGNOSIS — E559 Vitamin D deficiency, unspecified: Secondary | ICD-10-CM | POA: Diagnosis not present

## 2017-10-06 DIAGNOSIS — E538 Deficiency of other specified B group vitamins: Secondary | ICD-10-CM | POA: Diagnosis not present

## 2017-10-06 DIAGNOSIS — I2510 Atherosclerotic heart disease of native coronary artery without angina pectoris: Secondary | ICD-10-CM | POA: Diagnosis not present

## 2017-10-06 DIAGNOSIS — E785 Hyperlipidemia, unspecified: Secondary | ICD-10-CM | POA: Diagnosis not present

## 2017-10-06 DIAGNOSIS — E1149 Type 2 diabetes mellitus with other diabetic neurological complication: Secondary | ICD-10-CM | POA: Diagnosis not present

## 2017-10-06 DIAGNOSIS — F331 Major depressive disorder, recurrent, moderate: Secondary | ICD-10-CM | POA: Diagnosis not present

## 2017-10-06 DIAGNOSIS — I10 Essential (primary) hypertension: Secondary | ICD-10-CM | POA: Diagnosis not present

## 2017-10-06 DIAGNOSIS — D649 Anemia, unspecified: Secondary | ICD-10-CM | POA: Diagnosis not present

## 2017-10-06 DIAGNOSIS — Z0000 Encounter for general adult medical examination without abnormal findings: Secondary | ICD-10-CM | POA: Diagnosis not present

## 2017-10-20 DIAGNOSIS — G4700 Insomnia, unspecified: Secondary | ICD-10-CM | POA: Diagnosis not present

## 2017-10-20 DIAGNOSIS — F331 Major depressive disorder, recurrent, moderate: Secondary | ICD-10-CM | POA: Diagnosis not present

## 2017-10-20 DIAGNOSIS — F4310 Post-traumatic stress disorder, unspecified: Secondary | ICD-10-CM | POA: Diagnosis not present

## 2017-10-22 DIAGNOSIS — R260 Ataxic gait: Secondary | ICD-10-CM | POA: Diagnosis not present

## 2017-10-22 DIAGNOSIS — M5441 Lumbago with sciatica, right side: Secondary | ICD-10-CM | POA: Diagnosis not present

## 2017-10-22 DIAGNOSIS — M5442 Lumbago with sciatica, left side: Secondary | ICD-10-CM | POA: Diagnosis not present

## 2017-10-22 DIAGNOSIS — G603 Idiopathic progressive neuropathy: Secondary | ICD-10-CM | POA: Diagnosis not present

## 2017-10-22 DIAGNOSIS — M79675 Pain in left toe(s): Secondary | ICD-10-CM | POA: Diagnosis not present

## 2017-10-23 DIAGNOSIS — M79675 Pain in left toe(s): Secondary | ICD-10-CM | POA: Diagnosis not present

## 2017-11-04 DIAGNOSIS — M19011 Primary osteoarthritis, right shoulder: Secondary | ICD-10-CM | POA: Diagnosis not present

## 2017-11-04 DIAGNOSIS — Z794 Long term (current) use of insulin: Secondary | ICD-10-CM | POA: Diagnosis not present

## 2017-11-04 DIAGNOSIS — Z87891 Personal history of nicotine dependence: Secondary | ICD-10-CM | POA: Diagnosis not present

## 2017-11-04 DIAGNOSIS — S40011A Contusion of right shoulder, initial encounter: Secondary | ICD-10-CM | POA: Diagnosis not present

## 2017-11-04 DIAGNOSIS — E119 Type 2 diabetes mellitus without complications: Secondary | ICD-10-CM | POA: Diagnosis not present

## 2017-11-04 DIAGNOSIS — I10 Essential (primary) hypertension: Secondary | ICD-10-CM | POA: Diagnosis not present

## 2017-11-04 DIAGNOSIS — W19XXXA Unspecified fall, initial encounter: Secondary | ICD-10-CM | POA: Diagnosis not present

## 2017-11-04 DIAGNOSIS — S0990XA Unspecified injury of head, initial encounter: Secondary | ICD-10-CM | POA: Diagnosis not present

## 2017-11-04 DIAGNOSIS — R7989 Other specified abnormal findings of blood chemistry: Secondary | ICD-10-CM | POA: Diagnosis not present

## 2017-11-04 DIAGNOSIS — S199XXA Unspecified injury of neck, initial encounter: Secondary | ICD-10-CM | POA: Diagnosis not present

## 2017-11-04 DIAGNOSIS — E785 Hyperlipidemia, unspecified: Secondary | ICD-10-CM | POA: Diagnosis not present

## 2017-11-04 DIAGNOSIS — K219 Gastro-esophageal reflux disease without esophagitis: Secondary | ICD-10-CM | POA: Diagnosis not present

## 2017-11-04 DIAGNOSIS — D649 Anemia, unspecified: Secondary | ICD-10-CM | POA: Diagnosis not present

## 2017-11-04 DIAGNOSIS — S300XXA Contusion of lower back and pelvis, initial encounter: Secondary | ICD-10-CM | POA: Diagnosis not present

## 2017-11-04 DIAGNOSIS — N3000 Acute cystitis without hematuria: Secondary | ICD-10-CM | POA: Diagnosis not present

## 2017-11-04 DIAGNOSIS — Z7982 Long term (current) use of aspirin: Secondary | ICD-10-CM | POA: Diagnosis not present

## 2017-11-04 DIAGNOSIS — S4991XA Unspecified injury of right shoulder and upper arm, initial encounter: Secondary | ICD-10-CM | POA: Diagnosis not present

## 2017-11-04 DIAGNOSIS — R799 Abnormal finding of blood chemistry, unspecified: Secondary | ICD-10-CM | POA: Diagnosis not present

## 2017-11-04 DIAGNOSIS — Z981 Arthrodesis status: Secondary | ICD-10-CM | POA: Diagnosis not present

## 2017-11-04 DIAGNOSIS — Z79899 Other long term (current) drug therapy: Secondary | ICD-10-CM | POA: Diagnosis not present

## 2017-11-04 DIAGNOSIS — M545 Low back pain: Secondary | ICD-10-CM | POA: Diagnosis not present

## 2017-11-04 DIAGNOSIS — Z7951 Long term (current) use of inhaled steroids: Secondary | ICD-10-CM | POA: Diagnosis not present

## 2017-11-04 DIAGNOSIS — J449 Chronic obstructive pulmonary disease, unspecified: Secondary | ICD-10-CM | POA: Diagnosis not present

## 2017-11-04 DIAGNOSIS — M5136 Other intervertebral disc degeneration, lumbar region: Secondary | ICD-10-CM | POA: Diagnosis not present

## 2017-11-04 DIAGNOSIS — M4602 Spinal enthesopathy, cervical region: Secondary | ICD-10-CM | POA: Diagnosis not present

## 2017-11-04 DIAGNOSIS — M5030 Other cervical disc degeneration, unspecified cervical region: Secondary | ICD-10-CM | POA: Diagnosis not present

## 2017-11-04 DIAGNOSIS — G4733 Obstructive sleep apnea (adult) (pediatric): Secondary | ICD-10-CM | POA: Diagnosis not present

## 2017-11-04 DIAGNOSIS — M47892 Other spondylosis, cervical region: Secondary | ICD-10-CM | POA: Diagnosis not present

## 2017-11-10 DIAGNOSIS — I2510 Atherosclerotic heart disease of native coronary artery without angina pectoris: Secondary | ICD-10-CM | POA: Diagnosis not present

## 2017-11-10 DIAGNOSIS — G4733 Obstructive sleep apnea (adult) (pediatric): Secondary | ICD-10-CM | POA: Diagnosis not present

## 2017-11-10 DIAGNOSIS — I10 Essential (primary) hypertension: Secondary | ICD-10-CM | POA: Diagnosis not present

## 2017-11-10 DIAGNOSIS — Z9989 Dependence on other enabling machines and devices: Secondary | ICD-10-CM | POA: Diagnosis not present

## 2017-11-12 DIAGNOSIS — R079 Chest pain, unspecified: Secondary | ICD-10-CM | POA: Diagnosis not present

## 2017-11-12 DIAGNOSIS — I2510 Atherosclerotic heart disease of native coronary artery without angina pectoris: Secondary | ICD-10-CM | POA: Diagnosis not present

## 2017-11-17 DIAGNOSIS — F331 Major depressive disorder, recurrent, moderate: Secondary | ICD-10-CM | POA: Diagnosis not present

## 2017-11-17 DIAGNOSIS — G4700 Insomnia, unspecified: Secondary | ICD-10-CM | POA: Diagnosis not present

## 2017-11-17 DIAGNOSIS — F4310 Post-traumatic stress disorder, unspecified: Secondary | ICD-10-CM | POA: Diagnosis not present

## 2017-11-27 DIAGNOSIS — R3915 Urgency of urination: Secondary | ICD-10-CM | POA: Diagnosis not present

## 2017-11-27 DIAGNOSIS — R300 Dysuria: Secondary | ICD-10-CM | POA: Diagnosis not present

## 2017-12-04 DIAGNOSIS — Z13820 Encounter for screening for osteoporosis: Secondary | ICD-10-CM | POA: Diagnosis not present

## 2017-12-04 DIAGNOSIS — Z780 Asymptomatic menopausal state: Secondary | ICD-10-CM | POA: Diagnosis not present

## 2017-12-04 DIAGNOSIS — Z1231 Encounter for screening mammogram for malignant neoplasm of breast: Secondary | ICD-10-CM | POA: Diagnosis not present

## 2017-12-09 DIAGNOSIS — G5623 Lesion of ulnar nerve, bilateral upper limbs: Secondary | ICD-10-CM | POA: Diagnosis not present

## 2017-12-09 DIAGNOSIS — G603 Idiopathic progressive neuropathy: Secondary | ICD-10-CM | POA: Diagnosis not present

## 2017-12-09 DIAGNOSIS — M5412 Radiculopathy, cervical region: Secondary | ICD-10-CM | POA: Diagnosis not present

## 2017-12-09 DIAGNOSIS — Z79899 Other long term (current) drug therapy: Secondary | ICD-10-CM | POA: Diagnosis not present

## 2017-12-09 DIAGNOSIS — G5603 Carpal tunnel syndrome, bilateral upper limbs: Secondary | ICD-10-CM | POA: Diagnosis not present

## 2017-12-09 DIAGNOSIS — M5417 Radiculopathy, lumbosacral region: Secondary | ICD-10-CM | POA: Diagnosis not present

## 2017-12-09 DIAGNOSIS — R270 Ataxia, unspecified: Secondary | ICD-10-CM | POA: Diagnosis not present

## 2017-12-09 DIAGNOSIS — G43009 Migraine without aura, not intractable, without status migrainosus: Secondary | ICD-10-CM | POA: Diagnosis not present

## 2017-12-15 DIAGNOSIS — F331 Major depressive disorder, recurrent, moderate: Secondary | ICD-10-CM | POA: Diagnosis not present

## 2017-12-15 DIAGNOSIS — G4700 Insomnia, unspecified: Secondary | ICD-10-CM | POA: Diagnosis not present

## 2017-12-15 DIAGNOSIS — F4310 Post-traumatic stress disorder, unspecified: Secondary | ICD-10-CM | POA: Diagnosis not present

## 2017-12-17 DIAGNOSIS — G939 Disorder of brain, unspecified: Secondary | ICD-10-CM | POA: Diagnosis not present

## 2017-12-17 DIAGNOSIS — R531 Weakness: Secondary | ICD-10-CM | POA: Diagnosis not present

## 2017-12-17 DIAGNOSIS — R9082 White matter disease, unspecified: Secondary | ICD-10-CM | POA: Diagnosis not present

## 2017-12-17 DIAGNOSIS — R413 Other amnesia: Secondary | ICD-10-CM | POA: Diagnosis not present

## 2017-12-18 DIAGNOSIS — E538 Deficiency of other specified B group vitamins: Secondary | ICD-10-CM | POA: Diagnosis not present

## 2018-01-19 DIAGNOSIS — F331 Major depressive disorder, recurrent, moderate: Secondary | ICD-10-CM | POA: Diagnosis not present

## 2018-01-19 DIAGNOSIS — F4310 Post-traumatic stress disorder, unspecified: Secondary | ICD-10-CM | POA: Diagnosis not present

## 2018-01-19 DIAGNOSIS — G4700 Insomnia, unspecified: Secondary | ICD-10-CM | POA: Diagnosis not present

## 2018-01-26 DIAGNOSIS — F4310 Post-traumatic stress disorder, unspecified: Secondary | ICD-10-CM | POA: Diagnosis not present

## 2018-01-26 DIAGNOSIS — G4700 Insomnia, unspecified: Secondary | ICD-10-CM | POA: Diagnosis not present

## 2018-01-26 DIAGNOSIS — F331 Major depressive disorder, recurrent, moderate: Secondary | ICD-10-CM | POA: Diagnosis not present

## 2018-01-27 DIAGNOSIS — E790 Hyperuricemia without signs of inflammatory arthritis and tophaceous disease: Secondary | ICD-10-CM | POA: Diagnosis not present

## 2018-01-27 DIAGNOSIS — D649 Anemia, unspecified: Secondary | ICD-10-CM | POA: Diagnosis not present

## 2018-01-27 DIAGNOSIS — E1149 Type 2 diabetes mellitus with other diabetic neurological complication: Secondary | ICD-10-CM | POA: Diagnosis not present

## 2018-01-27 DIAGNOSIS — E785 Hyperlipidemia, unspecified: Secondary | ICD-10-CM | POA: Diagnosis not present

## 2018-01-27 DIAGNOSIS — I10 Essential (primary) hypertension: Secondary | ICD-10-CM | POA: Diagnosis not present

## 2018-01-27 DIAGNOSIS — G629 Polyneuropathy, unspecified: Secondary | ICD-10-CM | POA: Diagnosis not present

## 2018-01-27 DIAGNOSIS — R5383 Other fatigue: Secondary | ICD-10-CM | POA: Diagnosis not present

## 2018-01-27 DIAGNOSIS — E039 Hypothyroidism, unspecified: Secondary | ICD-10-CM | POA: Diagnosis not present

## 2018-02-02 DIAGNOSIS — E119 Type 2 diabetes mellitus without complications: Secondary | ICD-10-CM | POA: Diagnosis not present

## 2018-02-02 DIAGNOSIS — Z794 Long term (current) use of insulin: Secondary | ICD-10-CM | POA: Diagnosis not present

## 2018-02-02 DIAGNOSIS — Z961 Presence of intraocular lens: Secondary | ICD-10-CM | POA: Diagnosis not present

## 2018-02-02 DIAGNOSIS — H527 Unspecified disorder of refraction: Secondary | ICD-10-CM | POA: Diagnosis not present

## 2018-02-04 DIAGNOSIS — E1165 Type 2 diabetes mellitus with hyperglycemia: Secondary | ICD-10-CM | POA: Diagnosis not present

## 2018-02-04 DIAGNOSIS — E11649 Type 2 diabetes mellitus with hypoglycemia without coma: Secondary | ICD-10-CM | POA: Diagnosis not present

## 2018-02-04 DIAGNOSIS — E782 Mixed hyperlipidemia: Secondary | ICD-10-CM | POA: Diagnosis not present

## 2018-02-04 DIAGNOSIS — J449 Chronic obstructive pulmonary disease, unspecified: Secondary | ICD-10-CM | POA: Diagnosis not present

## 2018-02-04 DIAGNOSIS — I2510 Atherosclerotic heart disease of native coronary artery without angina pectoris: Secondary | ICD-10-CM | POA: Diagnosis not present

## 2018-02-04 DIAGNOSIS — M899 Disorder of bone, unspecified: Secondary | ICD-10-CM | POA: Diagnosis not present

## 2018-02-04 DIAGNOSIS — K589 Irritable bowel syndrome without diarrhea: Secondary | ICD-10-CM | POA: Diagnosis not present

## 2018-02-04 DIAGNOSIS — M48061 Spinal stenosis, lumbar region without neurogenic claudication: Secondary | ICD-10-CM | POA: Diagnosis not present

## 2018-02-04 DIAGNOSIS — Z6841 Body Mass Index (BMI) 40.0 and over, adult: Secondary | ICD-10-CM | POA: Diagnosis not present

## 2018-02-04 DIAGNOSIS — G4733 Obstructive sleep apnea (adult) (pediatric): Secondary | ICD-10-CM | POA: Diagnosis not present

## 2018-02-04 DIAGNOSIS — I10 Essential (primary) hypertension: Secondary | ICD-10-CM | POA: Diagnosis not present

## 2018-02-06 DIAGNOSIS — G2581 Restless legs syndrome: Secondary | ICD-10-CM | POA: Diagnosis not present

## 2018-02-06 DIAGNOSIS — M1990 Unspecified osteoarthritis, unspecified site: Secondary | ICD-10-CM | POA: Diagnosis not present

## 2018-02-06 DIAGNOSIS — Z79899 Other long term (current) drug therapy: Secondary | ICD-10-CM | POA: Diagnosis not present

## 2018-02-06 DIAGNOSIS — Z794 Long term (current) use of insulin: Secondary | ICD-10-CM | POA: Diagnosis not present

## 2018-02-06 DIAGNOSIS — I10 Essential (primary) hypertension: Secondary | ICD-10-CM | POA: Diagnosis not present

## 2018-02-06 DIAGNOSIS — Z7982 Long term (current) use of aspirin: Secondary | ICD-10-CM | POA: Diagnosis not present

## 2018-02-06 DIAGNOSIS — Z880 Allergy status to penicillin: Secondary | ICD-10-CM | POA: Diagnosis not present

## 2018-02-06 DIAGNOSIS — E785 Hyperlipidemia, unspecified: Secondary | ICD-10-CM | POA: Diagnosis not present

## 2018-02-06 DIAGNOSIS — Z888 Allergy status to other drugs, medicaments and biological substances status: Secondary | ICD-10-CM | POA: Diagnosis not present

## 2018-02-06 DIAGNOSIS — G4733 Obstructive sleep apnea (adult) (pediatric): Secondary | ICD-10-CM | POA: Diagnosis not present

## 2018-02-06 DIAGNOSIS — F419 Anxiety disorder, unspecified: Secondary | ICD-10-CM | POA: Diagnosis not present

## 2018-02-06 DIAGNOSIS — L0889 Other specified local infections of the skin and subcutaneous tissue: Secondary | ICD-10-CM | POA: Diagnosis not present

## 2018-02-06 DIAGNOSIS — J449 Chronic obstructive pulmonary disease, unspecified: Secondary | ICD-10-CM | POA: Diagnosis not present

## 2018-02-06 DIAGNOSIS — L03032 Cellulitis of left toe: Secondary | ICD-10-CM | POA: Diagnosis not present

## 2018-02-06 DIAGNOSIS — Z881 Allergy status to other antibiotic agents status: Secondary | ICD-10-CM | POA: Diagnosis not present

## 2018-02-06 DIAGNOSIS — E119 Type 2 diabetes mellitus without complications: Secondary | ICD-10-CM | POA: Diagnosis not present

## 2018-02-06 DIAGNOSIS — Z87891 Personal history of nicotine dependence: Secondary | ICD-10-CM | POA: Diagnosis not present

## 2018-02-11 DIAGNOSIS — M542 Cervicalgia: Secondary | ICD-10-CM | POA: Diagnosis not present

## 2018-02-11 DIAGNOSIS — R413 Other amnesia: Secondary | ICD-10-CM | POA: Diagnosis not present

## 2018-02-11 DIAGNOSIS — G5603 Carpal tunnel syndrome, bilateral upper limbs: Secondary | ICD-10-CM | POA: Diagnosis not present

## 2018-02-11 DIAGNOSIS — G43009 Migraine without aura, not intractable, without status migrainosus: Secondary | ICD-10-CM | POA: Diagnosis not present

## 2018-02-11 DIAGNOSIS — Z79899 Other long term (current) drug therapy: Secondary | ICD-10-CM | POA: Diagnosis not present

## 2018-03-04 DIAGNOSIS — F4310 Post-traumatic stress disorder, unspecified: Secondary | ICD-10-CM | POA: Diagnosis not present

## 2018-03-04 DIAGNOSIS — F331 Major depressive disorder, recurrent, moderate: Secondary | ICD-10-CM | POA: Diagnosis not present

## 2018-03-04 DIAGNOSIS — G4700 Insomnia, unspecified: Secondary | ICD-10-CM | POA: Diagnosis not present

## 2018-03-23 DIAGNOSIS — F4310 Post-traumatic stress disorder, unspecified: Secondary | ICD-10-CM | POA: Diagnosis not present

## 2018-03-23 DIAGNOSIS — F331 Major depressive disorder, recurrent, moderate: Secondary | ICD-10-CM | POA: Diagnosis not present

## 2018-03-23 DIAGNOSIS — G4700 Insomnia, unspecified: Secondary | ICD-10-CM | POA: Diagnosis not present

## 2018-04-08 DIAGNOSIS — G43009 Migraine without aura, not intractable, without status migrainosus: Secondary | ICD-10-CM | POA: Diagnosis not present

## 2018-04-08 DIAGNOSIS — M25552 Pain in left hip: Secondary | ICD-10-CM | POA: Diagnosis not present

## 2018-04-08 DIAGNOSIS — G5603 Carpal tunnel syndrome, bilateral upper limbs: Secondary | ICD-10-CM | POA: Diagnosis not present

## 2018-04-08 DIAGNOSIS — M542 Cervicalgia: Secondary | ICD-10-CM | POA: Diagnosis not present

## 2018-05-03 DIAGNOSIS — J069 Acute upper respiratory infection, unspecified: Secondary | ICD-10-CM | POA: Diagnosis not present

## 2018-05-03 DIAGNOSIS — J9801 Acute bronchospasm: Secondary | ICD-10-CM | POA: Diagnosis not present

## 2018-05-09 DIAGNOSIS — Z7982 Long term (current) use of aspirin: Secondary | ICD-10-CM | POA: Diagnosis not present

## 2018-05-09 DIAGNOSIS — Z794 Long term (current) use of insulin: Secondary | ICD-10-CM | POA: Diagnosis not present

## 2018-05-09 DIAGNOSIS — R51 Headache: Secondary | ICD-10-CM | POA: Diagnosis not present

## 2018-05-09 DIAGNOSIS — Z87891 Personal history of nicotine dependence: Secondary | ICD-10-CM | POA: Diagnosis not present

## 2018-05-09 DIAGNOSIS — E119 Type 2 diabetes mellitus without complications: Secondary | ICD-10-CM | POA: Diagnosis not present

## 2018-05-09 DIAGNOSIS — Z862 Personal history of diseases of the blood and blood-forming organs and certain disorders involving the immune mechanism: Secondary | ICD-10-CM | POA: Diagnosis not present

## 2018-05-09 DIAGNOSIS — M778 Other enthesopathies, not elsewhere classified: Secondary | ICD-10-CM | POA: Diagnosis not present

## 2018-05-09 DIAGNOSIS — Z981 Arthrodesis status: Secondary | ICD-10-CM | POA: Diagnosis not present

## 2018-05-09 DIAGNOSIS — S8002XA Contusion of left knee, initial encounter: Secondary | ICD-10-CM | POA: Diagnosis not present

## 2018-05-09 DIAGNOSIS — S0990XA Unspecified injury of head, initial encounter: Secondary | ICD-10-CM | POA: Diagnosis not present

## 2018-05-09 DIAGNOSIS — M25552 Pain in left hip: Secondary | ICD-10-CM | POA: Diagnosis not present

## 2018-05-09 DIAGNOSIS — G8911 Acute pain due to trauma: Secondary | ICD-10-CM | POA: Diagnosis not present

## 2018-05-09 DIAGNOSIS — Z79899 Other long term (current) drug therapy: Secondary | ICD-10-CM | POA: Diagnosis not present

## 2018-05-09 DIAGNOSIS — M1712 Unilateral primary osteoarthritis, left knee: Secondary | ICD-10-CM | POA: Diagnosis not present

## 2018-05-09 DIAGNOSIS — I2510 Atherosclerotic heart disease of native coronary artery without angina pectoris: Secondary | ICD-10-CM | POA: Diagnosis not present

## 2018-05-09 DIAGNOSIS — M25562 Pain in left knee: Secondary | ICD-10-CM | POA: Diagnosis not present

## 2018-05-09 DIAGNOSIS — I10 Essential (primary) hypertension: Secondary | ICD-10-CM | POA: Diagnosis not present

## 2018-05-09 DIAGNOSIS — Z955 Presence of coronary angioplasty implant and graft: Secondary | ICD-10-CM | POA: Diagnosis not present

## 2018-05-09 DIAGNOSIS — E860 Dehydration: Secondary | ICD-10-CM | POA: Diagnosis not present

## 2018-05-09 DIAGNOSIS — S51012A Laceration without foreign body of left elbow, initial encounter: Secondary | ICD-10-CM | POA: Diagnosis not present

## 2018-05-12 DIAGNOSIS — R5383 Other fatigue: Secondary | ICD-10-CM | POA: Diagnosis not present

## 2018-05-12 DIAGNOSIS — E1149 Type 2 diabetes mellitus with other diabetic neurological complication: Secondary | ICD-10-CM | POA: Diagnosis not present

## 2018-05-12 DIAGNOSIS — I10 Essential (primary) hypertension: Secondary | ICD-10-CM | POA: Diagnosis not present

## 2018-05-12 DIAGNOSIS — D649 Anemia, unspecified: Secondary | ICD-10-CM | POA: Diagnosis not present

## 2018-05-12 DIAGNOSIS — I952 Hypotension due to drugs: Secondary | ICD-10-CM | POA: Diagnosis not present

## 2018-05-13 DIAGNOSIS — I10 Essential (primary) hypertension: Secondary | ICD-10-CM | POA: Diagnosis not present

## 2018-05-13 DIAGNOSIS — Z9989 Dependence on other enabling machines and devices: Secondary | ICD-10-CM | POA: Diagnosis not present

## 2018-05-13 DIAGNOSIS — Z6841 Body Mass Index (BMI) 40.0 and over, adult: Secondary | ICD-10-CM | POA: Diagnosis not present

## 2018-05-13 DIAGNOSIS — G4733 Obstructive sleep apnea (adult) (pediatric): Secondary | ICD-10-CM | POA: Diagnosis not present

## 2018-05-13 IMAGING — DX DG LUMBAR SPINE COMPLETE 4+V
5 series · 5 of 5 positions shown · non-contrast
Comparison: Intraoperative lumbar spine films of 11/03/2014

CLINICAL DATA: Low back pain

EXAM:
LUMBAR SPINE - COMPLETE 4+ VIEW

[l-spine ap]
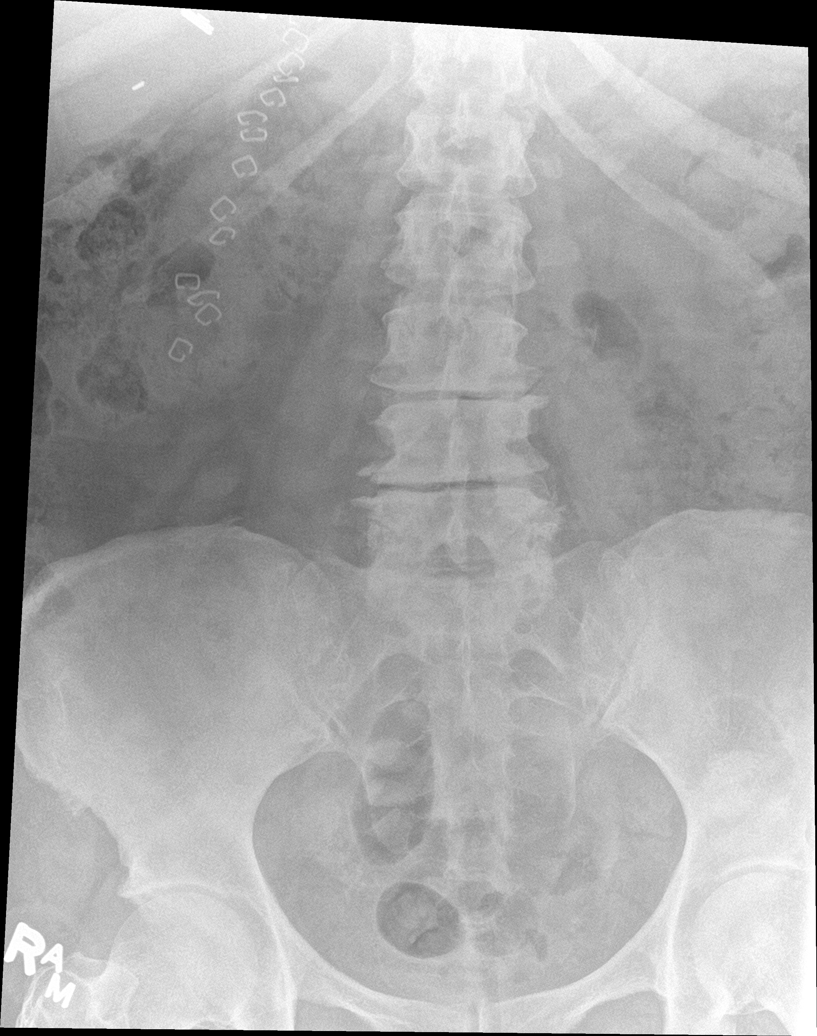

[l-spine lat]
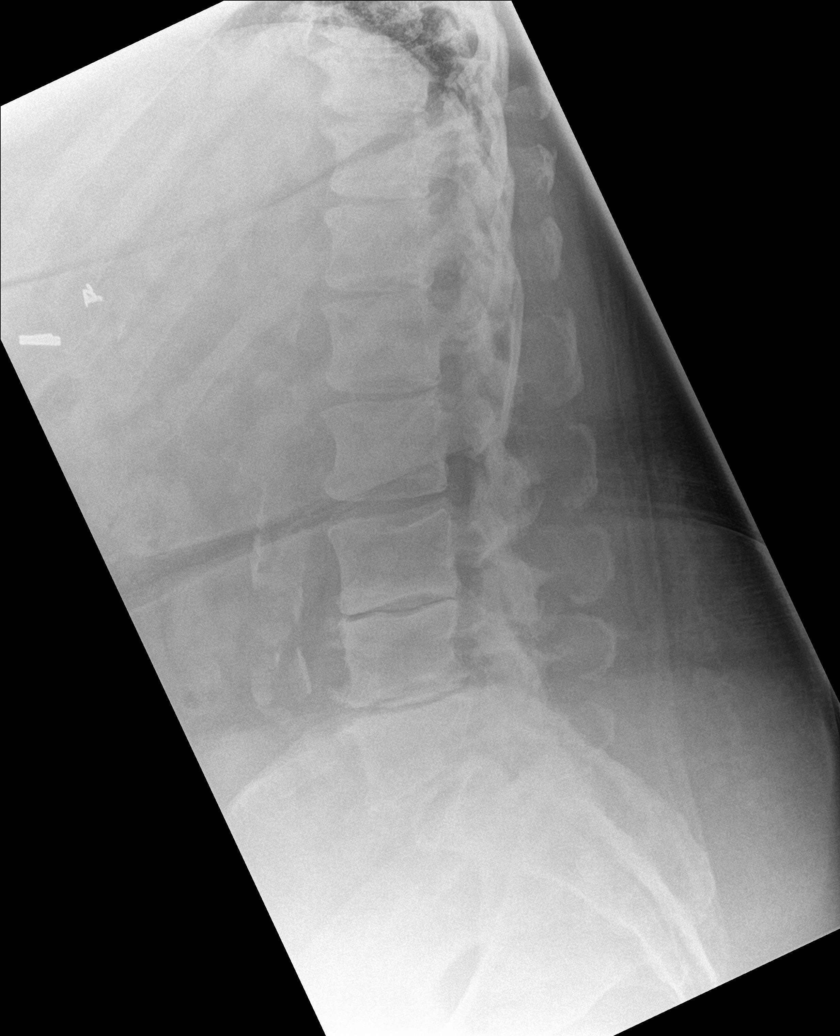

[l-spine spot]
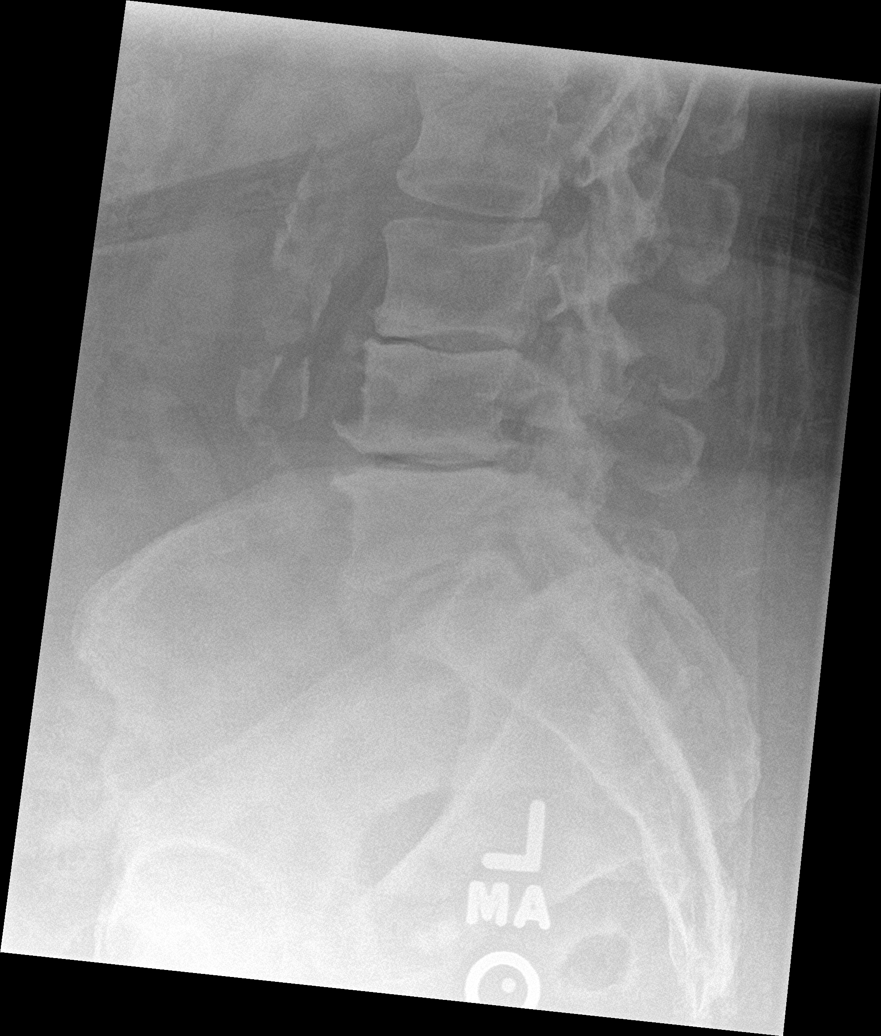

[l-spine flex]
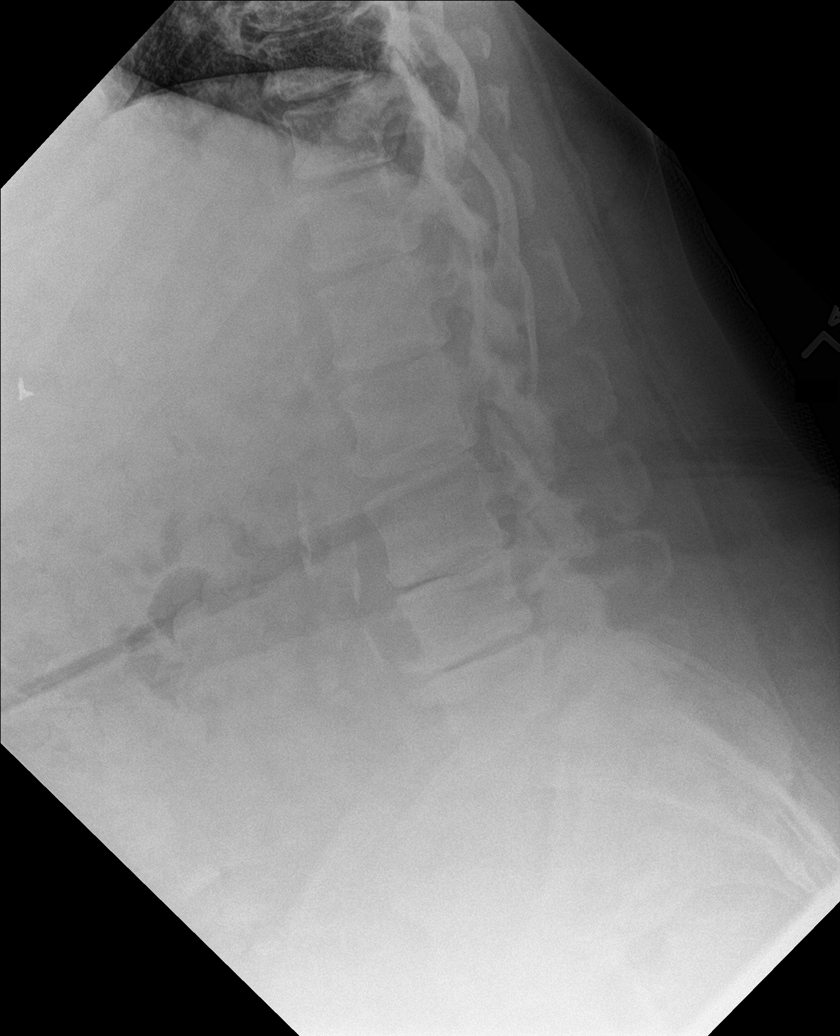

[l-spine ext]
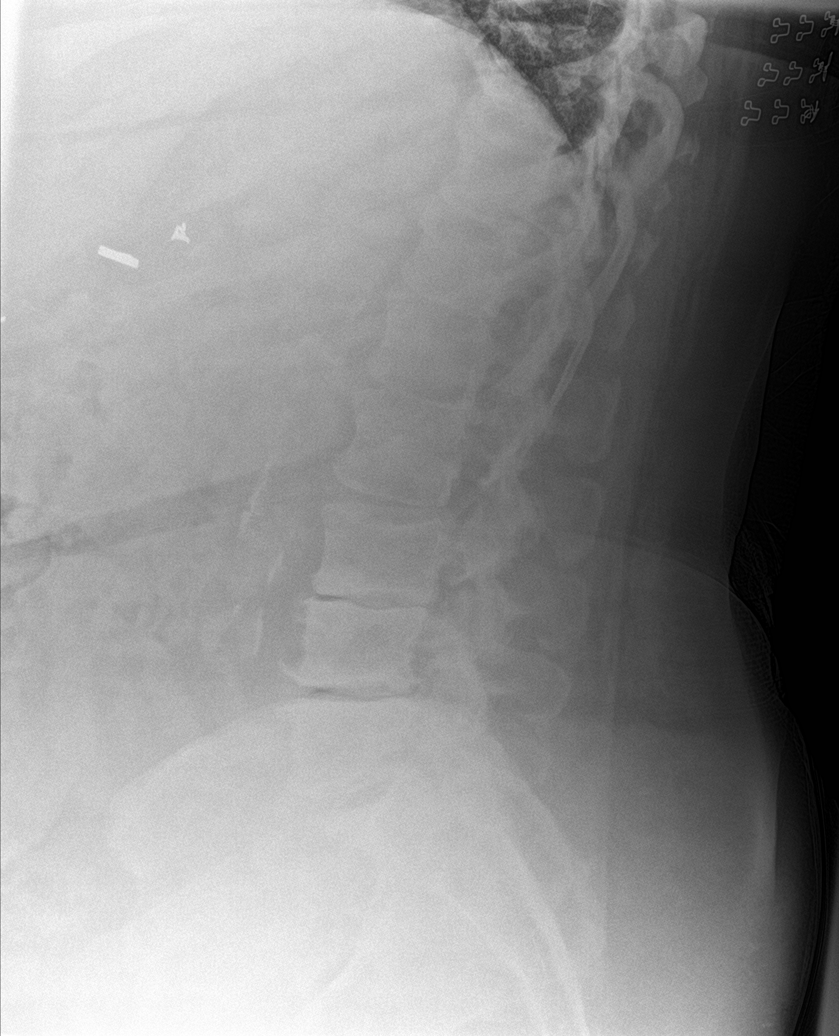

[5 of 5 positions shown; findings below may reference images not displayed]

FINDINGS: There has been progression of degenerative disc disease at L3-4 and
L4-5 levels with little change in degenerative disc disease at
L5-S1. The lumbar vertebrae are unchanged in alignment. No
compression deformity is seen. Abdominal aortic atherosclerosis is
noted.
IMPRESSION: 1. Progression of degenerative disc disease primarily at L3-4 and
L4-5.
2. Abdominal aortic atherosclerosis.

## 2018-05-25 DIAGNOSIS — E538 Deficiency of other specified B group vitamins: Secondary | ICD-10-CM | POA: Diagnosis not present

## 2018-05-25 DIAGNOSIS — R748 Abnormal levels of other serum enzymes: Secondary | ICD-10-CM | POA: Diagnosis not present

## 2018-06-19 DIAGNOSIS — L03116 Cellulitis of left lower limb: Secondary | ICD-10-CM | POA: Diagnosis not present

## 2018-06-19 DIAGNOSIS — M7989 Other specified soft tissue disorders: Secondary | ICD-10-CM | POA: Diagnosis not present

## 2018-06-22 DIAGNOSIS — R7989 Other specified abnormal findings of blood chemistry: Secondary | ICD-10-CM | POA: Diagnosis not present

## 2018-06-22 DIAGNOSIS — I10 Essential (primary) hypertension: Secondary | ICD-10-CM | POA: Diagnosis not present

## 2018-06-22 DIAGNOSIS — D649 Anemia, unspecified: Secondary | ICD-10-CM | POA: Diagnosis not present

## 2018-06-22 DIAGNOSIS — R799 Abnormal finding of blood chemistry, unspecified: Secondary | ICD-10-CM | POA: Diagnosis not present

## 2018-06-22 DIAGNOSIS — E1149 Type 2 diabetes mellitus with other diabetic neurological complication: Secondary | ICD-10-CM | POA: Diagnosis not present

## 2018-06-22 DIAGNOSIS — J449 Chronic obstructive pulmonary disease, unspecified: Secondary | ICD-10-CM | POA: Diagnosis not present

## 2018-06-22 DIAGNOSIS — E785 Hyperlipidemia, unspecified: Secondary | ICD-10-CM | POA: Diagnosis not present

## 2018-07-07 DIAGNOSIS — M5442 Lumbago with sciatica, left side: Secondary | ICD-10-CM | POA: Diagnosis not present

## 2018-07-07 DIAGNOSIS — R270 Ataxia, unspecified: Secondary | ICD-10-CM | POA: Diagnosis not present

## 2018-07-07 DIAGNOSIS — M542 Cervicalgia: Secondary | ICD-10-CM | POA: Diagnosis not present

## 2018-07-07 DIAGNOSIS — G43009 Migraine without aura, not intractable, without status migrainosus: Secondary | ICD-10-CM | POA: Diagnosis not present

## 2018-07-07 DIAGNOSIS — G5603 Carpal tunnel syndrome, bilateral upper limbs: Secondary | ICD-10-CM | POA: Diagnosis not present

## 2018-07-07 DIAGNOSIS — G603 Idiopathic progressive neuropathy: Secondary | ICD-10-CM | POA: Diagnosis not present

## 2018-07-07 DIAGNOSIS — M5441 Lumbago with sciatica, right side: Secondary | ICD-10-CM | POA: Diagnosis not present

## 2018-07-09 DIAGNOSIS — E538 Deficiency of other specified B group vitamins: Secondary | ICD-10-CM | POA: Diagnosis not present

## 2018-07-09 DIAGNOSIS — G589 Mononeuropathy, unspecified: Secondary | ICD-10-CM | POA: Diagnosis not present

## 2018-07-09 DIAGNOSIS — M2550 Pain in unspecified joint: Secondary | ICD-10-CM | POA: Diagnosis not present

## 2018-07-09 DIAGNOSIS — G609 Hereditary and idiopathic neuropathy, unspecified: Secondary | ICD-10-CM | POA: Diagnosis not present

## 2018-07-09 DIAGNOSIS — E559 Vitamin D deficiency, unspecified: Secondary | ICD-10-CM | POA: Diagnosis not present

## 2018-07-30 ENCOUNTER — Other Ambulatory Visit: Payer: Self-pay

## 2018-09-02 DIAGNOSIS — F4310 Post-traumatic stress disorder, unspecified: Secondary | ICD-10-CM | POA: Diagnosis not present

## 2018-09-02 DIAGNOSIS — G4700 Insomnia, unspecified: Secondary | ICD-10-CM | POA: Diagnosis not present

## 2018-09-02 DIAGNOSIS — F331 Major depressive disorder, recurrent, moderate: Secondary | ICD-10-CM | POA: Diagnosis not present

## 2018-09-24 DIAGNOSIS — Z23 Encounter for immunization: Secondary | ICD-10-CM | POA: Diagnosis not present

## 2018-09-24 DIAGNOSIS — E538 Deficiency of other specified B group vitamins: Secondary | ICD-10-CM | POA: Diagnosis not present

## 2018-09-27 DIAGNOSIS — I10 Essential (primary) hypertension: Secondary | ICD-10-CM | POA: Diagnosis not present

## 2018-09-27 DIAGNOSIS — Z955 Presence of coronary angioplasty implant and graft: Secondary | ICD-10-CM | POA: Diagnosis not present

## 2018-09-27 DIAGNOSIS — I2510 Atherosclerotic heart disease of native coronary artery without angina pectoris: Secondary | ICD-10-CM | POA: Diagnosis not present

## 2018-10-07 DIAGNOSIS — R5383 Other fatigue: Secondary | ICD-10-CM | POA: Diagnosis not present

## 2018-10-07 DIAGNOSIS — E1149 Type 2 diabetes mellitus with other diabetic neurological complication: Secondary | ICD-10-CM | POA: Diagnosis not present

## 2018-10-07 DIAGNOSIS — E785 Hyperlipidemia, unspecified: Secondary | ICD-10-CM | POA: Diagnosis not present

## 2018-10-07 DIAGNOSIS — E538 Deficiency of other specified B group vitamins: Secondary | ICD-10-CM | POA: Diagnosis not present

## 2018-10-07 DIAGNOSIS — E039 Hypothyroidism, unspecified: Secondary | ICD-10-CM | POA: Diagnosis not present

## 2018-10-07 DIAGNOSIS — M899 Disorder of bone, unspecified: Secondary | ICD-10-CM | POA: Diagnosis not present

## 2018-10-07 DIAGNOSIS — R799 Abnormal finding of blood chemistry, unspecified: Secondary | ICD-10-CM | POA: Diagnosis not present

## 2018-10-07 DIAGNOSIS — Z1231 Encounter for screening mammogram for malignant neoplasm of breast: Secondary | ICD-10-CM | POA: Diagnosis not present

## 2018-10-07 DIAGNOSIS — R7989 Other specified abnormal findings of blood chemistry: Secondary | ICD-10-CM | POA: Diagnosis not present

## 2018-10-07 DIAGNOSIS — M1A9XX0 Chronic gout, unspecified, without tophus (tophi): Secondary | ICD-10-CM | POA: Diagnosis not present

## 2018-10-07 DIAGNOSIS — J449 Chronic obstructive pulmonary disease, unspecified: Secondary | ICD-10-CM | POA: Diagnosis not present

## 2018-10-07 DIAGNOSIS — D649 Anemia, unspecified: Secondary | ICD-10-CM | POA: Diagnosis not present

## 2018-10-07 DIAGNOSIS — I10 Essential (primary) hypertension: Secondary | ICD-10-CM | POA: Diagnosis not present

## 2018-10-07 DIAGNOSIS — Z0000 Encounter for general adult medical examination without abnormal findings: Secondary | ICD-10-CM | POA: Diagnosis not present

## 2018-10-07 DIAGNOSIS — E559 Vitamin D deficiency, unspecified: Secondary | ICD-10-CM | POA: Diagnosis not present

## 2018-10-12 DIAGNOSIS — M79642 Pain in left hand: Secondary | ICD-10-CM | POA: Diagnosis not present

## 2018-11-30 DIAGNOSIS — M79642 Pain in left hand: Secondary | ICD-10-CM | POA: Diagnosis not present

## 2018-11-30 DIAGNOSIS — M79641 Pain in right hand: Secondary | ICD-10-CM | POA: Diagnosis not present

## 2018-12-06 DIAGNOSIS — F331 Major depressive disorder, recurrent, moderate: Secondary | ICD-10-CM | POA: Diagnosis not present

## 2018-12-06 DIAGNOSIS — Z1231 Encounter for screening mammogram for malignant neoplasm of breast: Secondary | ICD-10-CM | POA: Diagnosis not present

## 2018-12-06 DIAGNOSIS — G4700 Insomnia, unspecified: Secondary | ICD-10-CM | POA: Diagnosis not present

## 2018-12-06 DIAGNOSIS — F419 Anxiety disorder, unspecified: Secondary | ICD-10-CM | POA: Diagnosis not present

## 2018-12-06 DIAGNOSIS — F4310 Post-traumatic stress disorder, unspecified: Secondary | ICD-10-CM | POA: Diagnosis not present

## 2018-12-09 DIAGNOSIS — Z86010 Personal history of colonic polyps: Secondary | ICD-10-CM | POA: Diagnosis not present

## 2018-12-09 DIAGNOSIS — K642 Third degree hemorrhoids: Secondary | ICD-10-CM | POA: Diagnosis not present

## 2018-12-28 DIAGNOSIS — R05 Cough: Secondary | ICD-10-CM | POA: Diagnosis not present

## 2018-12-28 DIAGNOSIS — Z9861 Coronary angioplasty status: Secondary | ICD-10-CM | POA: Diagnosis not present

## 2018-12-28 DIAGNOSIS — Z87891 Personal history of nicotine dependence: Secondary | ICD-10-CM | POA: Diagnosis not present

## 2018-12-28 DIAGNOSIS — Z794 Long term (current) use of insulin: Secondary | ICD-10-CM | POA: Diagnosis not present

## 2018-12-28 DIAGNOSIS — B9789 Other viral agents as the cause of diseases classified elsewhere: Secondary | ICD-10-CM | POA: Diagnosis not present

## 2018-12-28 DIAGNOSIS — J069 Acute upper respiratory infection, unspecified: Secondary | ICD-10-CM | POA: Diagnosis not present

## 2018-12-28 DIAGNOSIS — G4730 Sleep apnea, unspecified: Secondary | ICD-10-CM | POA: Diagnosis not present

## 2018-12-28 DIAGNOSIS — M1990 Unspecified osteoarthritis, unspecified site: Secondary | ICD-10-CM | POA: Diagnosis not present

## 2018-12-28 DIAGNOSIS — Z881 Allergy status to other antibiotic agents status: Secondary | ICD-10-CM | POA: Diagnosis not present

## 2018-12-28 DIAGNOSIS — R0682 Tachypnea, not elsewhere classified: Secondary | ICD-10-CM | POA: Diagnosis not present

## 2018-12-28 DIAGNOSIS — Z7982 Long term (current) use of aspirin: Secondary | ICD-10-CM | POA: Diagnosis not present

## 2018-12-28 DIAGNOSIS — E785 Hyperlipidemia, unspecified: Secondary | ICD-10-CM | POA: Diagnosis not present

## 2018-12-28 DIAGNOSIS — F329 Major depressive disorder, single episode, unspecified: Secondary | ICD-10-CM | POA: Diagnosis not present

## 2018-12-28 DIAGNOSIS — I2510 Atherosclerotic heart disease of native coronary artery without angina pectoris: Secondary | ICD-10-CM | POA: Diagnosis not present

## 2018-12-28 DIAGNOSIS — Z91048 Other nonmedicinal substance allergy status: Secondary | ICD-10-CM | POA: Diagnosis not present

## 2018-12-28 DIAGNOSIS — Z79899 Other long term (current) drug therapy: Secondary | ICD-10-CM | POA: Diagnosis not present

## 2018-12-28 DIAGNOSIS — Z888 Allergy status to other drugs, medicaments and biological substances status: Secondary | ICD-10-CM | POA: Diagnosis not present

## 2018-12-28 DIAGNOSIS — J449 Chronic obstructive pulmonary disease, unspecified: Secondary | ICD-10-CM | POA: Diagnosis not present

## 2018-12-28 DIAGNOSIS — I10 Essential (primary) hypertension: Secondary | ICD-10-CM | POA: Diagnosis not present

## 2018-12-28 DIAGNOSIS — Z880 Allergy status to penicillin: Secondary | ICD-10-CM | POA: Diagnosis not present

## 2018-12-28 DIAGNOSIS — E1169 Type 2 diabetes mellitus with other specified complication: Secondary | ICD-10-CM | POA: Diagnosis not present

## 2019-02-01 DIAGNOSIS — F329 Major depressive disorder, single episode, unspecified: Secondary | ICD-10-CM | POA: Diagnosis not present

## 2019-02-01 DIAGNOSIS — Z881 Allergy status to other antibiotic agents status: Secondary | ICD-10-CM | POA: Diagnosis not present

## 2019-02-01 DIAGNOSIS — E785 Hyperlipidemia, unspecified: Secondary | ICD-10-CM | POA: Diagnosis not present

## 2019-02-01 DIAGNOSIS — J441 Chronic obstructive pulmonary disease with (acute) exacerbation: Secondary | ICD-10-CM | POA: Diagnosis not present

## 2019-02-01 DIAGNOSIS — I10 Essential (primary) hypertension: Secondary | ICD-10-CM | POA: Diagnosis not present

## 2019-02-01 DIAGNOSIS — Z888 Allergy status to other drugs, medicaments and biological substances status: Secondary | ICD-10-CM | POA: Diagnosis not present

## 2019-02-01 DIAGNOSIS — Z9109 Other allergy status, other than to drugs and biological substances: Secondary | ICD-10-CM | POA: Diagnosis not present

## 2019-02-01 DIAGNOSIS — I2510 Atherosclerotic heart disease of native coronary artery without angina pectoris: Secondary | ICD-10-CM | POA: Diagnosis not present

## 2019-02-01 DIAGNOSIS — E119 Type 2 diabetes mellitus without complications: Secondary | ICD-10-CM | POA: Diagnosis not present

## 2019-02-01 DIAGNOSIS — Z79899 Other long term (current) drug therapy: Secondary | ICD-10-CM | POA: Diagnosis not present

## 2019-02-01 DIAGNOSIS — R05 Cough: Secondary | ICD-10-CM | POA: Diagnosis not present

## 2019-02-01 DIAGNOSIS — Z794 Long term (current) use of insulin: Secondary | ICD-10-CM | POA: Diagnosis not present

## 2019-02-01 DIAGNOSIS — K219 Gastro-esophageal reflux disease without esophagitis: Secondary | ICD-10-CM | POA: Diagnosis not present

## 2019-02-01 DIAGNOSIS — Z7982 Long term (current) use of aspirin: Secondary | ICD-10-CM | POA: Diagnosis not present

## 2019-02-01 DIAGNOSIS — Z87891 Personal history of nicotine dependence: Secondary | ICD-10-CM | POA: Diagnosis not present

## 2019-02-08 DIAGNOSIS — D649 Anemia, unspecified: Secondary | ICD-10-CM | POA: Diagnosis not present

## 2019-02-08 DIAGNOSIS — E785 Hyperlipidemia, unspecified: Secondary | ICD-10-CM | POA: Diagnosis not present

## 2019-02-08 DIAGNOSIS — I10 Essential (primary) hypertension: Secondary | ICD-10-CM | POA: Diagnosis not present

## 2019-02-08 DIAGNOSIS — E039 Hypothyroidism, unspecified: Secondary | ICD-10-CM | POA: Diagnosis not present

## 2019-02-08 DIAGNOSIS — R5383 Other fatigue: Secondary | ICD-10-CM | POA: Diagnosis not present

## 2019-02-08 DIAGNOSIS — R7989 Other specified abnormal findings of blood chemistry: Secondary | ICD-10-CM | POA: Diagnosis not present

## 2019-02-24 DIAGNOSIS — Z79899 Other long term (current) drug therapy: Secondary | ICD-10-CM | POA: Diagnosis not present

## 2019-02-24 DIAGNOSIS — G5603 Carpal tunnel syndrome, bilateral upper limbs: Secondary | ICD-10-CM | POA: Diagnosis not present

## 2019-02-24 DIAGNOSIS — G43009 Migraine without aura, not intractable, without status migrainosus: Secondary | ICD-10-CM | POA: Diagnosis not present

## 2019-02-24 DIAGNOSIS — M5481 Occipital neuralgia: Secondary | ICD-10-CM | POA: Diagnosis not present

## 2019-02-24 DIAGNOSIS — M542 Cervicalgia: Secondary | ICD-10-CM | POA: Diagnosis not present

## 2019-02-24 DIAGNOSIS — M545 Low back pain: Secondary | ICD-10-CM | POA: Diagnosis not present

## 2019-03-14 DIAGNOSIS — J984 Other disorders of lung: Secondary | ICD-10-CM | POA: Diagnosis not present

## 2019-03-14 DIAGNOSIS — J449 Chronic obstructive pulmonary disease, unspecified: Secondary | ICD-10-CM | POA: Diagnosis not present

## 2019-03-17 DIAGNOSIS — Z9989 Dependence on other enabling machines and devices: Secondary | ICD-10-CM | POA: Diagnosis not present

## 2019-03-17 DIAGNOSIS — R918 Other nonspecific abnormal finding of lung field: Secondary | ICD-10-CM | POA: Diagnosis not present

## 2019-03-17 DIAGNOSIS — J984 Other disorders of lung: Secondary | ICD-10-CM | POA: Diagnosis not present

## 2019-03-17 DIAGNOSIS — G4733 Obstructive sleep apnea (adult) (pediatric): Secondary | ICD-10-CM | POA: Diagnosis not present

## 2019-06-02 DIAGNOSIS — G603 Idiopathic progressive neuropathy: Secondary | ICD-10-CM | POA: Diagnosis not present

## 2019-06-02 DIAGNOSIS — G43009 Migraine without aura, not intractable, without status migrainosus: Secondary | ICD-10-CM | POA: Diagnosis not present

## 2019-06-02 DIAGNOSIS — M545 Low back pain: Secondary | ICD-10-CM | POA: Diagnosis not present

## 2019-06-02 DIAGNOSIS — M5481 Occipital neuralgia: Secondary | ICD-10-CM | POA: Diagnosis not present

## 2019-06-02 DIAGNOSIS — M542 Cervicalgia: Secondary | ICD-10-CM | POA: Diagnosis not present

## 2019-06-13 DIAGNOSIS — Z794 Long term (current) use of insulin: Secondary | ICD-10-CM | POA: Diagnosis not present

## 2019-06-13 DIAGNOSIS — E1142 Type 2 diabetes mellitus with diabetic polyneuropathy: Secondary | ICD-10-CM | POA: Diagnosis not present

## 2019-06-13 DIAGNOSIS — R079 Chest pain, unspecified: Secondary | ICD-10-CM | POA: Diagnosis not present

## 2019-06-13 DIAGNOSIS — Z880 Allergy status to penicillin: Secondary | ICD-10-CM | POA: Diagnosis not present

## 2019-06-13 DIAGNOSIS — Z881 Allergy status to other antibiotic agents status: Secondary | ICD-10-CM | POA: Diagnosis not present

## 2019-06-13 DIAGNOSIS — J449 Chronic obstructive pulmonary disease, unspecified: Secondary | ICD-10-CM | POA: Diagnosis not present

## 2019-06-13 DIAGNOSIS — Z7951 Long term (current) use of inhaled steroids: Secondary | ICD-10-CM | POA: Diagnosis not present

## 2019-06-13 DIAGNOSIS — Z888 Allergy status to other drugs, medicaments and biological substances status: Secondary | ICD-10-CM | POA: Diagnosis not present

## 2019-06-13 DIAGNOSIS — Z79899 Other long term (current) drug therapy: Secondary | ICD-10-CM | POA: Diagnosis not present

## 2019-06-13 DIAGNOSIS — M2578 Osteophyte, vertebrae: Secondary | ICD-10-CM | POA: Diagnosis not present

## 2019-06-13 DIAGNOSIS — K219 Gastro-esophageal reflux disease without esophagitis: Secondary | ICD-10-CM | POA: Diagnosis not present

## 2019-06-13 DIAGNOSIS — M50322 Other cervical disc degeneration at C5-C6 level: Secondary | ICD-10-CM | POA: Diagnosis not present

## 2019-06-13 DIAGNOSIS — E785 Hyperlipidemia, unspecified: Secondary | ICD-10-CM | POA: Diagnosis not present

## 2019-06-13 DIAGNOSIS — Z7952 Long term (current) use of systemic steroids: Secondary | ICD-10-CM | POA: Diagnosis not present

## 2019-06-13 DIAGNOSIS — Z7982 Long term (current) use of aspirin: Secondary | ICD-10-CM | POA: Diagnosis not present

## 2019-06-13 DIAGNOSIS — M47812 Spondylosis without myelopathy or radiculopathy, cervical region: Secondary | ICD-10-CM | POA: Diagnosis not present

## 2019-06-13 DIAGNOSIS — M25512 Pain in left shoulder: Secondary | ICD-10-CM | POA: Diagnosis not present

## 2019-06-13 DIAGNOSIS — E039 Hypothyroidism, unspecified: Secondary | ICD-10-CM | POA: Diagnosis not present

## 2019-06-13 DIAGNOSIS — M5412 Radiculopathy, cervical region: Secondary | ICD-10-CM | POA: Diagnosis not present

## 2019-06-13 DIAGNOSIS — R0602 Shortness of breath: Secondary | ICD-10-CM | POA: Diagnosis not present

## 2019-06-13 DIAGNOSIS — Z87891 Personal history of nicotine dependence: Secondary | ICD-10-CM | POA: Diagnosis not present

## 2019-06-13 DIAGNOSIS — I10 Essential (primary) hypertension: Secondary | ICD-10-CM | POA: Diagnosis not present

## 2019-06-13 DIAGNOSIS — I2510 Atherosclerotic heart disease of native coronary artery without angina pectoris: Secondary | ICD-10-CM | POA: Diagnosis not present

## 2019-06-13 DIAGNOSIS — G4733 Obstructive sleep apnea (adult) (pediatric): Secondary | ICD-10-CM | POA: Diagnosis not present

## 2019-06-16 DIAGNOSIS — F4310 Post-traumatic stress disorder, unspecified: Secondary | ICD-10-CM | POA: Diagnosis not present

## 2019-06-16 DIAGNOSIS — F331 Major depressive disorder, recurrent, moderate: Secondary | ICD-10-CM | POA: Diagnosis not present

## 2019-06-16 DIAGNOSIS — G4700 Insomnia, unspecified: Secondary | ICD-10-CM | POA: Diagnosis not present

## 2019-06-16 DIAGNOSIS — F419 Anxiety disorder, unspecified: Secondary | ICD-10-CM | POA: Diagnosis not present

## 2019-06-21 DIAGNOSIS — I10 Essential (primary) hypertension: Secondary | ICD-10-CM | POA: Diagnosis not present

## 2019-06-21 DIAGNOSIS — E1149 Type 2 diabetes mellitus with other diabetic neurological complication: Secondary | ICD-10-CM | POA: Diagnosis not present

## 2019-06-21 DIAGNOSIS — E039 Hypothyroidism, unspecified: Secondary | ICD-10-CM | POA: Diagnosis not present

## 2019-06-21 DIAGNOSIS — E785 Hyperlipidemia, unspecified: Secondary | ICD-10-CM | POA: Diagnosis not present

## 2019-07-01 DIAGNOSIS — R5381 Other malaise: Secondary | ICD-10-CM | POA: Diagnosis not present

## 2019-07-01 DIAGNOSIS — R05 Cough: Secondary | ICD-10-CM | POA: Diagnosis not present

## 2019-07-01 DIAGNOSIS — Z20828 Contact with and (suspected) exposure to other viral communicable diseases: Secondary | ICD-10-CM | POA: Diagnosis not present

## 2019-07-01 DIAGNOSIS — R0602 Shortness of breath: Secondary | ICD-10-CM | POA: Diagnosis not present

## 2019-07-22 DIAGNOSIS — E039 Hypothyroidism, unspecified: Secondary | ICD-10-CM | POA: Diagnosis not present

## 2019-07-22 DIAGNOSIS — S99829A Other specified injuries of unspecified foot, initial encounter: Secondary | ICD-10-CM | POA: Diagnosis not present

## 2019-07-22 DIAGNOSIS — M79676 Pain in unspecified toe(s): Secondary | ICD-10-CM | POA: Diagnosis not present

## 2019-07-22 DIAGNOSIS — I10 Essential (primary) hypertension: Secondary | ICD-10-CM | POA: Diagnosis not present

## 2019-07-22 DIAGNOSIS — E785 Hyperlipidemia, unspecified: Secondary | ICD-10-CM | POA: Diagnosis not present

## 2019-07-22 DIAGNOSIS — E1149 Type 2 diabetes mellitus with other diabetic neurological complication: Secondary | ICD-10-CM | POA: Diagnosis not present

## 2019-07-22 DIAGNOSIS — E1169 Type 2 diabetes mellitus with other specified complication: Secondary | ICD-10-CM | POA: Diagnosis not present

## 2019-08-23 DIAGNOSIS — M79642 Pain in left hand: Secondary | ICD-10-CM | POA: Diagnosis not present

## 2019-08-25 DIAGNOSIS — Z961 Presence of intraocular lens: Secondary | ICD-10-CM | POA: Diagnosis not present

## 2019-08-25 DIAGNOSIS — E119 Type 2 diabetes mellitus without complications: Secondary | ICD-10-CM | POA: Diagnosis not present

## 2019-08-25 DIAGNOSIS — H527 Unspecified disorder of refraction: Secondary | ICD-10-CM | POA: Diagnosis not present

## 2019-08-25 DIAGNOSIS — Z794 Long term (current) use of insulin: Secondary | ICD-10-CM | POA: Diagnosis not present

## 2019-09-08 DIAGNOSIS — Z23 Encounter for immunization: Secondary | ICD-10-CM | POA: Diagnosis not present

## 2019-09-12 DIAGNOSIS — K5900 Constipation, unspecified: Secondary | ICD-10-CM | POA: Diagnosis not present

## 2019-09-15 DIAGNOSIS — F4310 Post-traumatic stress disorder, unspecified: Secondary | ICD-10-CM | POA: Diagnosis not present

## 2019-09-15 DIAGNOSIS — F419 Anxiety disorder, unspecified: Secondary | ICD-10-CM | POA: Diagnosis not present

## 2019-09-15 DIAGNOSIS — F331 Major depressive disorder, recurrent, moderate: Secondary | ICD-10-CM | POA: Diagnosis not present

## 2019-09-15 DIAGNOSIS — G4700 Insomnia, unspecified: Secondary | ICD-10-CM | POA: Diagnosis not present

## 2019-09-20 DIAGNOSIS — E538 Deficiency of other specified B group vitamins: Secondary | ICD-10-CM | POA: Diagnosis not present

## 2019-09-22 DIAGNOSIS — M545 Low back pain: Secondary | ICD-10-CM | POA: Diagnosis not present

## 2019-09-22 DIAGNOSIS — G603 Idiopathic progressive neuropathy: Secondary | ICD-10-CM | POA: Diagnosis not present

## 2019-09-22 DIAGNOSIS — M542 Cervicalgia: Secondary | ICD-10-CM | POA: Diagnosis not present

## 2019-09-22 DIAGNOSIS — G5602 Carpal tunnel syndrome, left upper limb: Secondary | ICD-10-CM | POA: Diagnosis not present

## 2019-09-22 DIAGNOSIS — M5481 Occipital neuralgia: Secondary | ICD-10-CM | POA: Diagnosis not present

## 2019-09-22 DIAGNOSIS — G250 Essential tremor: Secondary | ICD-10-CM | POA: Diagnosis not present

## 2019-09-26 DIAGNOSIS — R918 Other nonspecific abnormal finding of lung field: Secondary | ICD-10-CM | POA: Diagnosis not present

## 2019-10-04 DIAGNOSIS — M25561 Pain in right knee: Secondary | ICD-10-CM | POA: Diagnosis not present

## 2019-10-04 DIAGNOSIS — M79642 Pain in left hand: Secondary | ICD-10-CM | POA: Diagnosis not present

## 2019-10-06 DIAGNOSIS — I2510 Atherosclerotic heart disease of native coronary artery without angina pectoris: Secondary | ICD-10-CM | POA: Diagnosis not present

## 2019-10-06 DIAGNOSIS — J449 Chronic obstructive pulmonary disease, unspecified: Secondary | ICD-10-CM | POA: Diagnosis not present

## 2019-10-06 DIAGNOSIS — R0600 Dyspnea, unspecified: Secondary | ICD-10-CM | POA: Diagnosis not present

## 2019-10-06 DIAGNOSIS — I10 Essential (primary) hypertension: Secondary | ICD-10-CM | POA: Diagnosis not present

## 2019-10-06 DIAGNOSIS — Z955 Presence of coronary angioplasty implant and graft: Secondary | ICD-10-CM | POA: Diagnosis not present

## 2019-10-13 DIAGNOSIS — F331 Major depressive disorder, recurrent, moderate: Secondary | ICD-10-CM | POA: Diagnosis not present

## 2019-10-13 DIAGNOSIS — F4310 Post-traumatic stress disorder, unspecified: Secondary | ICD-10-CM | POA: Diagnosis not present

## 2019-10-13 DIAGNOSIS — F419 Anxiety disorder, unspecified: Secondary | ICD-10-CM | POA: Diagnosis not present

## 2019-10-13 DIAGNOSIS — G4700 Insomnia, unspecified: Secondary | ICD-10-CM | POA: Diagnosis not present

## 2019-10-20 DIAGNOSIS — Z0000 Encounter for general adult medical examination without abnormal findings: Secondary | ICD-10-CM | POA: Diagnosis not present

## 2019-10-20 DIAGNOSIS — I10 Essential (primary) hypertension: Secondary | ICD-10-CM | POA: Diagnosis not present

## 2019-10-20 DIAGNOSIS — E039 Hypothyroidism, unspecified: Secondary | ICD-10-CM | POA: Diagnosis not present

## 2019-10-20 DIAGNOSIS — Z23 Encounter for immunization: Secondary | ICD-10-CM | POA: Diagnosis not present

## 2019-10-20 DIAGNOSIS — E1149 Type 2 diabetes mellitus with other diabetic neurological complication: Secondary | ICD-10-CM | POA: Diagnosis not present

## 2019-10-20 DIAGNOSIS — J449 Chronic obstructive pulmonary disease, unspecified: Secondary | ICD-10-CM | POA: Diagnosis not present

## 2019-10-20 DIAGNOSIS — Z01419 Encounter for gynecological examination (general) (routine) without abnormal findings: Secondary | ICD-10-CM | POA: Diagnosis not present

## 2019-10-26 DIAGNOSIS — Z7952 Long term (current) use of systemic steroids: Secondary | ICD-10-CM | POA: Diagnosis not present

## 2019-10-26 DIAGNOSIS — J45909 Unspecified asthma, uncomplicated: Secondary | ICD-10-CM | POA: Diagnosis not present

## 2019-10-26 DIAGNOSIS — J9801 Acute bronchospasm: Secondary | ICD-10-CM | POA: Diagnosis not present

## 2019-10-27 DIAGNOSIS — I059 Rheumatic mitral valve disease, unspecified: Secondary | ICD-10-CM | POA: Diagnosis not present

## 2019-11-03 DIAGNOSIS — D513 Other dietary vitamin B12 deficiency anemia: Secondary | ICD-10-CM | POA: Diagnosis not present

## 2019-11-03 DIAGNOSIS — G5623 Lesion of ulnar nerve, bilateral upper limbs: Secondary | ICD-10-CM | POA: Diagnosis not present

## 2019-11-03 DIAGNOSIS — R748 Abnormal levels of other serum enzymes: Secondary | ICD-10-CM | POA: Diagnosis not present

## 2019-11-03 DIAGNOSIS — G5603 Carpal tunnel syndrome, bilateral upper limbs: Secondary | ICD-10-CM | POA: Diagnosis not present

## 2019-11-03 DIAGNOSIS — R202 Paresthesia of skin: Secondary | ICD-10-CM | POA: Diagnosis not present

## 2019-11-03 DIAGNOSIS — G603 Idiopathic progressive neuropathy: Secondary | ICD-10-CM | POA: Diagnosis not present

## 2019-11-03 DIAGNOSIS — D520 Dietary folate deficiency anemia: Secondary | ICD-10-CM | POA: Diagnosis not present

## 2019-11-03 DIAGNOSIS — D649 Anemia, unspecified: Secondary | ICD-10-CM | POA: Diagnosis not present

## 2019-11-03 DIAGNOSIS — M5412 Radiculopathy, cervical region: Secondary | ICD-10-CM | POA: Diagnosis not present

## 2019-11-03 DIAGNOSIS — Z79899 Other long term (current) drug therapy: Secondary | ICD-10-CM | POA: Diagnosis not present

## 2019-11-03 DIAGNOSIS — G43009 Migraine without aura, not intractable, without status migrainosus: Secondary | ICD-10-CM | POA: Diagnosis not present

## 2019-11-03 DIAGNOSIS — M5417 Radiculopathy, lumbosacral region: Secondary | ICD-10-CM | POA: Diagnosis not present

## 2019-11-08 DIAGNOSIS — M79642 Pain in left hand: Secondary | ICD-10-CM | POA: Diagnosis not present

## 2019-11-16 DIAGNOSIS — Z955 Presence of coronary angioplasty implant and graft: Secondary | ICD-10-CM | POA: Diagnosis not present

## 2019-11-16 DIAGNOSIS — R0600 Dyspnea, unspecified: Secondary | ICD-10-CM | POA: Diagnosis not present

## 2019-11-16 DIAGNOSIS — I2510 Atherosclerotic heart disease of native coronary artery without angina pectoris: Secondary | ICD-10-CM | POA: Diagnosis not present

## 2019-11-16 DIAGNOSIS — I10 Essential (primary) hypertension: Secondary | ICD-10-CM | POA: Diagnosis not present

## 2019-11-16 DIAGNOSIS — J449 Chronic obstructive pulmonary disease, unspecified: Secondary | ICD-10-CM | POA: Diagnosis not present

## 2019-11-22 ENCOUNTER — Other Ambulatory Visit: Payer: Self-pay

## 2019-12-16 DIAGNOSIS — Z01818 Encounter for other preprocedural examination: Secondary | ICD-10-CM | POA: Diagnosis not present

## 2019-12-16 DIAGNOSIS — Z13820 Encounter for screening for osteoporosis: Secondary | ICD-10-CM | POA: Diagnosis not present

## 2019-12-16 DIAGNOSIS — Z780 Asymptomatic menopausal state: Secondary | ICD-10-CM | POA: Diagnosis not present

## 2019-12-16 DIAGNOSIS — Z1231 Encounter for screening mammogram for malignant neoplasm of breast: Secondary | ICD-10-CM | POA: Diagnosis not present

## 2019-12-19 DIAGNOSIS — E1142 Type 2 diabetes mellitus with diabetic polyneuropathy: Secondary | ICD-10-CM | POA: Diagnosis not present

## 2019-12-19 DIAGNOSIS — M1812 Unilateral primary osteoarthritis of first carpometacarpal joint, left hand: Secondary | ICD-10-CM | POA: Diagnosis not present

## 2019-12-19 DIAGNOSIS — G4733 Obstructive sleep apnea (adult) (pediatric): Secondary | ICD-10-CM | POA: Diagnosis not present

## 2019-12-19 DIAGNOSIS — F419 Anxiety disorder, unspecified: Secondary | ICD-10-CM | POA: Diagnosis not present

## 2019-12-19 DIAGNOSIS — M1990 Unspecified osteoarthritis, unspecified site: Secondary | ICD-10-CM | POA: Diagnosis not present

## 2019-12-19 DIAGNOSIS — M19042 Primary osteoarthritis, left hand: Secondary | ICD-10-CM | POA: Diagnosis not present

## 2019-12-19 DIAGNOSIS — K449 Diaphragmatic hernia without obstruction or gangrene: Secondary | ICD-10-CM | POA: Diagnosis not present

## 2019-12-19 DIAGNOSIS — E782 Mixed hyperlipidemia: Secondary | ICD-10-CM | POA: Diagnosis not present

## 2019-12-19 DIAGNOSIS — M65322 Trigger finger, left index finger: Secondary | ICD-10-CM | POA: Diagnosis not present

## 2019-12-19 DIAGNOSIS — I2510 Atherosclerotic heart disease of native coronary artery without angina pectoris: Secondary | ICD-10-CM | POA: Diagnosis not present

## 2019-12-19 DIAGNOSIS — M19032 Primary osteoarthritis, left wrist: Secondary | ICD-10-CM | POA: Diagnosis not present

## 2019-12-19 DIAGNOSIS — G5602 Carpal tunnel syndrome, left upper limb: Secondary | ICD-10-CM | POA: Diagnosis not present

## 2019-12-19 DIAGNOSIS — E039 Hypothyroidism, unspecified: Secondary | ICD-10-CM | POA: Diagnosis not present

## 2019-12-19 DIAGNOSIS — M659 Synovitis and tenosynovitis, unspecified: Secondary | ICD-10-CM | POA: Diagnosis not present

## 2019-12-19 DIAGNOSIS — I10 Essential (primary) hypertension: Secondary | ICD-10-CM | POA: Diagnosis not present

## 2019-12-27 DIAGNOSIS — M79642 Pain in left hand: Secondary | ICD-10-CM | POA: Diagnosis not present

## 2020-01-05 DIAGNOSIS — M79642 Pain in left hand: Secondary | ICD-10-CM | POA: Diagnosis not present

## 2020-01-12 DIAGNOSIS — M79642 Pain in left hand: Secondary | ICD-10-CM | POA: Diagnosis not present

## 2020-01-19 DIAGNOSIS — M79642 Pain in left hand: Secondary | ICD-10-CM | POA: Diagnosis not present

## 2020-02-02 DIAGNOSIS — F4310 Post-traumatic stress disorder, unspecified: Secondary | ICD-10-CM | POA: Diagnosis not present

## 2020-02-02 DIAGNOSIS — F419 Anxiety disorder, unspecified: Secondary | ICD-10-CM | POA: Diagnosis not present

## 2020-02-02 DIAGNOSIS — F331 Major depressive disorder, recurrent, moderate: Secondary | ICD-10-CM | POA: Diagnosis not present

## 2020-02-02 DIAGNOSIS — M79642 Pain in left hand: Secondary | ICD-10-CM | POA: Diagnosis not present

## 2020-02-02 DIAGNOSIS — G4700 Insomnia, unspecified: Secondary | ICD-10-CM | POA: Diagnosis not present

## 2020-02-09 DIAGNOSIS — M25551 Pain in right hip: Secondary | ICD-10-CM | POA: Diagnosis not present

## 2020-02-09 DIAGNOSIS — M79642 Pain in left hand: Secondary | ICD-10-CM | POA: Diagnosis not present

## 2020-02-09 DIAGNOSIS — G250 Essential tremor: Secondary | ICD-10-CM | POA: Diagnosis not present

## 2020-02-09 DIAGNOSIS — G603 Idiopathic progressive neuropathy: Secondary | ICD-10-CM | POA: Diagnosis not present

## 2020-02-09 DIAGNOSIS — M545 Low back pain: Secondary | ICD-10-CM | POA: Diagnosis not present

## 2020-02-09 DIAGNOSIS — Z79899 Other long term (current) drug therapy: Secondary | ICD-10-CM | POA: Diagnosis not present

## 2020-02-23 DIAGNOSIS — M79642 Pain in left hand: Secondary | ICD-10-CM | POA: Diagnosis not present

## 2020-03-15 DIAGNOSIS — E785 Hyperlipidemia, unspecified: Secondary | ICD-10-CM | POA: Diagnosis not present

## 2020-03-15 DIAGNOSIS — J449 Chronic obstructive pulmonary disease, unspecified: Secondary | ICD-10-CM | POA: Diagnosis not present

## 2020-03-15 DIAGNOSIS — E1169 Type 2 diabetes mellitus with other specified complication: Secondary | ICD-10-CM | POA: Diagnosis not present

## 2020-03-15 DIAGNOSIS — I10 Essential (primary) hypertension: Secondary | ICD-10-CM | POA: Diagnosis not present

## 2020-03-20 DIAGNOSIS — M79642 Pain in left hand: Secondary | ICD-10-CM | POA: Diagnosis not present

## 2020-04-19 DIAGNOSIS — G5603 Carpal tunnel syndrome, bilateral upper limbs: Secondary | ICD-10-CM | POA: Diagnosis not present

## 2020-04-19 DIAGNOSIS — G253 Myoclonus: Secondary | ICD-10-CM | POA: Diagnosis not present

## 2020-04-19 DIAGNOSIS — M5417 Radiculopathy, lumbosacral region: Secondary | ICD-10-CM | POA: Diagnosis not present

## 2020-04-19 DIAGNOSIS — Z79899 Other long term (current) drug therapy: Secondary | ICD-10-CM | POA: Diagnosis not present

## 2020-04-26 DIAGNOSIS — M25551 Pain in right hip: Secondary | ICD-10-CM | POA: Diagnosis not present

## 2020-04-27 DIAGNOSIS — E039 Hypothyroidism, unspecified: Secondary | ICD-10-CM | POA: Diagnosis not present

## 2020-04-27 DIAGNOSIS — E1169 Type 2 diabetes mellitus with other specified complication: Secondary | ICD-10-CM | POA: Diagnosis not present

## 2020-04-27 DIAGNOSIS — I10 Essential (primary) hypertension: Secondary | ICD-10-CM | POA: Diagnosis not present

## 2020-04-27 DIAGNOSIS — E785 Hyperlipidemia, unspecified: Secondary | ICD-10-CM | POA: Diagnosis not present

## 2020-05-01 DIAGNOSIS — M545 Low back pain: Secondary | ICD-10-CM | POA: Diagnosis not present

## 2020-05-01 DIAGNOSIS — M5416 Radiculopathy, lumbar region: Secondary | ICD-10-CM | POA: Diagnosis not present

## 2020-05-07 DIAGNOSIS — M47816 Spondylosis without myelopathy or radiculopathy, lumbar region: Secondary | ICD-10-CM | POA: Diagnosis not present

## 2020-05-07 DIAGNOSIS — M47815 Spondylosis without myelopathy or radiculopathy, thoracolumbar region: Secondary | ICD-10-CM | POA: Diagnosis not present

## 2020-05-07 DIAGNOSIS — M5126 Other intervertebral disc displacement, lumbar region: Secondary | ICD-10-CM | POA: Diagnosis not present

## 2020-05-07 DIAGNOSIS — M5136 Other intervertebral disc degeneration, lumbar region: Secondary | ICD-10-CM | POA: Diagnosis not present

## 2020-05-07 DIAGNOSIS — M545 Low back pain: Secondary | ICD-10-CM | POA: Diagnosis not present

## 2020-05-07 DIAGNOSIS — M5416 Radiculopathy, lumbar region: Secondary | ICD-10-CM | POA: Diagnosis not present

## 2020-05-17 DIAGNOSIS — M5412 Radiculopathy, cervical region: Secondary | ICD-10-CM | POA: Diagnosis not present

## 2020-05-17 DIAGNOSIS — M5417 Radiculopathy, lumbosacral region: Secondary | ICD-10-CM | POA: Diagnosis not present

## 2020-05-17 DIAGNOSIS — M5416 Radiculopathy, lumbar region: Secondary | ICD-10-CM | POA: Diagnosis not present

## 2020-05-30 DIAGNOSIS — M5416 Radiculopathy, lumbar region: Secondary | ICD-10-CM | POA: Diagnosis not present

## 2020-05-31 DIAGNOSIS — F4310 Post-traumatic stress disorder, unspecified: Secondary | ICD-10-CM | POA: Diagnosis not present

## 2020-05-31 DIAGNOSIS — F331 Major depressive disorder, recurrent, moderate: Secondary | ICD-10-CM | POA: Diagnosis not present

## 2020-05-31 DIAGNOSIS — G4700 Insomnia, unspecified: Secondary | ICD-10-CM | POA: Diagnosis not present

## 2020-05-31 DIAGNOSIS — F419 Anxiety disorder, unspecified: Secondary | ICD-10-CM | POA: Diagnosis not present

## 2020-06-14 DIAGNOSIS — M5416 Radiculopathy, lumbar region: Secondary | ICD-10-CM | POA: Diagnosis not present

## 2020-06-14 DIAGNOSIS — M545 Low back pain: Secondary | ICD-10-CM | POA: Diagnosis not present

## 2020-06-17 DIAGNOSIS — Z888 Allergy status to other drugs, medicaments and biological substances status: Secondary | ICD-10-CM | POA: Diagnosis not present

## 2020-06-17 DIAGNOSIS — E119 Type 2 diabetes mellitus without complications: Secondary | ICD-10-CM | POA: Diagnosis not present

## 2020-06-17 DIAGNOSIS — G4733 Obstructive sleep apnea (adult) (pediatric): Secondary | ICD-10-CM | POA: Diagnosis not present

## 2020-06-17 DIAGNOSIS — Z7982 Long term (current) use of aspirin: Secondary | ICD-10-CM | POA: Diagnosis not present

## 2020-06-17 DIAGNOSIS — J449 Chronic obstructive pulmonary disease, unspecified: Secondary | ICD-10-CM | POA: Diagnosis not present

## 2020-06-17 DIAGNOSIS — R600 Localized edema: Secondary | ICD-10-CM | POA: Diagnosis not present

## 2020-06-17 DIAGNOSIS — M7989 Other specified soft tissue disorders: Secondary | ICD-10-CM | POA: Diagnosis not present

## 2020-06-17 DIAGNOSIS — Z881 Allergy status to other antibiotic agents status: Secondary | ICD-10-CM | POA: Diagnosis not present

## 2020-06-17 DIAGNOSIS — R9431 Abnormal electrocardiogram [ECG] [EKG]: Secondary | ICD-10-CM | POA: Diagnosis not present

## 2020-06-17 DIAGNOSIS — Z79899 Other long term (current) drug therapy: Secondary | ICD-10-CM | POA: Diagnosis not present

## 2020-06-17 DIAGNOSIS — E785 Hyperlipidemia, unspecified: Secondary | ICD-10-CM | POA: Diagnosis not present

## 2020-06-17 DIAGNOSIS — I10 Essential (primary) hypertension: Secondary | ICD-10-CM | POA: Diagnosis not present

## 2020-06-17 DIAGNOSIS — R2243 Localized swelling, mass and lump, lower limb, bilateral: Secondary | ICD-10-CM | POA: Diagnosis not present

## 2020-06-17 DIAGNOSIS — Z87891 Personal history of nicotine dependence: Secondary | ICD-10-CM | POA: Diagnosis not present

## 2020-06-17 DIAGNOSIS — Z880 Allergy status to penicillin: Secondary | ICD-10-CM | POA: Diagnosis not present

## 2020-06-17 DIAGNOSIS — E039 Hypothyroidism, unspecified: Secondary | ICD-10-CM | POA: Diagnosis not present

## 2020-06-17 DIAGNOSIS — I2510 Atherosclerotic heart disease of native coronary artery without angina pectoris: Secondary | ICD-10-CM | POA: Diagnosis not present

## 2020-06-17 DIAGNOSIS — Z9109 Other allergy status, other than to drugs and biological substances: Secondary | ICD-10-CM | POA: Diagnosis not present

## 2020-06-17 DIAGNOSIS — K219 Gastro-esophageal reflux disease without esophagitis: Secondary | ICD-10-CM | POA: Diagnosis not present

## 2020-06-27 DIAGNOSIS — M5416 Radiculopathy, lumbar region: Secondary | ICD-10-CM | POA: Diagnosis not present

## 2020-08-01 DIAGNOSIS — Z87891 Personal history of nicotine dependence: Secondary | ICD-10-CM | POA: Diagnosis not present

## 2020-08-01 DIAGNOSIS — Z955 Presence of coronary angioplasty implant and graft: Secondary | ICD-10-CM | POA: Diagnosis not present

## 2020-08-01 DIAGNOSIS — B9562 Methicillin resistant Staphylococcus aureus infection as the cause of diseases classified elsewhere: Secondary | ICD-10-CM | POA: Diagnosis not present

## 2020-08-01 DIAGNOSIS — D649 Anemia, unspecified: Secondary | ICD-10-CM | POA: Diagnosis not present

## 2020-08-01 DIAGNOSIS — M79662 Pain in left lower leg: Secondary | ICD-10-CM | POA: Diagnosis not present

## 2020-08-01 DIAGNOSIS — I2510 Atherosclerotic heart disease of native coronary artery without angina pectoris: Secondary | ICD-10-CM | POA: Diagnosis not present

## 2020-08-01 DIAGNOSIS — G8929 Other chronic pain: Secondary | ICD-10-CM | POA: Diagnosis not present

## 2020-08-01 DIAGNOSIS — Z794 Long term (current) use of insulin: Secondary | ICD-10-CM | POA: Diagnosis not present

## 2020-08-01 DIAGNOSIS — F419 Anxiety disorder, unspecified: Secondary | ICD-10-CM | POA: Diagnosis not present

## 2020-08-01 DIAGNOSIS — E1142 Type 2 diabetes mellitus with diabetic polyneuropathy: Secondary | ICD-10-CM | POA: Diagnosis not present

## 2020-08-01 DIAGNOSIS — Z79890 Hormone replacement therapy (postmenopausal): Secondary | ICD-10-CM | POA: Diagnosis not present

## 2020-08-01 DIAGNOSIS — Z7982 Long term (current) use of aspirin: Secondary | ICD-10-CM | POA: Diagnosis not present

## 2020-08-01 DIAGNOSIS — M7989 Other specified soft tissue disorders: Secondary | ICD-10-CM | POA: Diagnosis not present

## 2020-08-01 DIAGNOSIS — E876 Hypokalemia: Secondary | ICD-10-CM | POA: Diagnosis not present

## 2020-08-01 DIAGNOSIS — G4733 Obstructive sleep apnea (adult) (pediatric): Secondary | ICD-10-CM | POA: Diagnosis not present

## 2020-08-01 DIAGNOSIS — Z7951 Long term (current) use of inhaled steroids: Secondary | ICD-10-CM | POA: Diagnosis not present

## 2020-08-01 DIAGNOSIS — E1165 Type 2 diabetes mellitus with hyperglycemia: Secondary | ICD-10-CM | POA: Diagnosis not present

## 2020-08-01 DIAGNOSIS — N390 Urinary tract infection, site not specified: Secondary | ICD-10-CM | POA: Diagnosis not present

## 2020-08-01 DIAGNOSIS — L03116 Cellulitis of left lower limb: Secondary | ICD-10-CM | POA: Diagnosis not present

## 2020-08-01 DIAGNOSIS — E785 Hyperlipidemia, unspecified: Secondary | ICD-10-CM | POA: Diagnosis not present

## 2020-08-01 DIAGNOSIS — Z6841 Body Mass Index (BMI) 40.0 and over, adult: Secondary | ICD-10-CM | POA: Diagnosis not present

## 2020-08-01 DIAGNOSIS — J449 Chronic obstructive pulmonary disease, unspecified: Secondary | ICD-10-CM | POA: Diagnosis not present

## 2020-08-01 DIAGNOSIS — M549 Dorsalgia, unspecified: Secondary | ICD-10-CM | POA: Diagnosis not present

## 2020-08-01 DIAGNOSIS — R2242 Localized swelling, mass and lump, left lower limb: Secondary | ICD-10-CM | POA: Diagnosis not present

## 2020-08-01 DIAGNOSIS — Z79899 Other long term (current) drug therapy: Secondary | ICD-10-CM | POA: Diagnosis not present

## 2020-08-01 DIAGNOSIS — I10 Essential (primary) hypertension: Secondary | ICD-10-CM | POA: Diagnosis not present

## 2020-08-01 DIAGNOSIS — E039 Hypothyroidism, unspecified: Secondary | ICD-10-CM | POA: Diagnosis not present

## 2020-08-07 DIAGNOSIS — M79672 Pain in left foot: Secondary | ICD-10-CM | POA: Diagnosis not present

## 2020-08-07 DIAGNOSIS — D649 Anemia, unspecified: Secondary | ICD-10-CM | POA: Diagnosis not present

## 2020-08-07 DIAGNOSIS — E1142 Type 2 diabetes mellitus with diabetic polyneuropathy: Secondary | ICD-10-CM | POA: Diagnosis not present

## 2020-08-07 DIAGNOSIS — F419 Anxiety disorder, unspecified: Secondary | ICD-10-CM | POA: Diagnosis not present

## 2020-08-07 DIAGNOSIS — M545 Low back pain: Secondary | ICD-10-CM | POA: Diagnosis not present

## 2020-08-07 DIAGNOSIS — E785 Hyperlipidemia, unspecified: Secondary | ICD-10-CM | POA: Diagnosis not present

## 2020-08-07 DIAGNOSIS — E559 Vitamin D deficiency, unspecified: Secondary | ICD-10-CM | POA: Diagnosis not present

## 2020-08-07 DIAGNOSIS — G43909 Migraine, unspecified, not intractable, without status migrainosus: Secondary | ICD-10-CM | POA: Diagnosis not present

## 2020-08-07 DIAGNOSIS — M1990 Unspecified osteoarthritis, unspecified site: Secondary | ICD-10-CM | POA: Diagnosis not present

## 2020-08-07 DIAGNOSIS — K589 Irritable bowel syndrome without diarrhea: Secondary | ICD-10-CM | POA: Diagnosis not present

## 2020-08-07 DIAGNOSIS — G8929 Other chronic pain: Secondary | ICD-10-CM | POA: Diagnosis not present

## 2020-08-07 DIAGNOSIS — J449 Chronic obstructive pulmonary disease, unspecified: Secondary | ICD-10-CM | POA: Diagnosis not present

## 2020-08-07 DIAGNOSIS — K449 Diaphragmatic hernia without obstruction or gangrene: Secondary | ICD-10-CM | POA: Diagnosis not present

## 2020-08-07 DIAGNOSIS — I10 Essential (primary) hypertension: Secondary | ICD-10-CM | POA: Diagnosis not present

## 2020-08-07 DIAGNOSIS — G2581 Restless legs syndrome: Secondary | ICD-10-CM | POA: Diagnosis not present

## 2020-08-07 DIAGNOSIS — G4733 Obstructive sleep apnea (adult) (pediatric): Secondary | ICD-10-CM | POA: Diagnosis not present

## 2020-08-07 DIAGNOSIS — E039 Hypothyroidism, unspecified: Secondary | ICD-10-CM | POA: Diagnosis not present

## 2020-08-07 DIAGNOSIS — E119 Type 2 diabetes mellitus without complications: Secondary | ICD-10-CM | POA: Diagnosis not present

## 2020-08-07 DIAGNOSIS — E876 Hypokalemia: Secondary | ICD-10-CM | POA: Diagnosis not present

## 2020-08-07 DIAGNOSIS — K5909 Other constipation: Secondary | ICD-10-CM | POA: Diagnosis not present

## 2020-08-07 DIAGNOSIS — M79671 Pain in right foot: Secondary | ICD-10-CM | POA: Diagnosis not present

## 2020-08-07 DIAGNOSIS — L03116 Cellulitis of left lower limb: Secondary | ICD-10-CM | POA: Diagnosis not present

## 2020-08-07 DIAGNOSIS — F329 Major depressive disorder, single episode, unspecified: Secondary | ICD-10-CM | POA: Diagnosis not present

## 2020-08-07 DIAGNOSIS — I2510 Atherosclerotic heart disease of native coronary artery without angina pectoris: Secondary | ICD-10-CM | POA: Diagnosis not present

## 2020-08-07 DIAGNOSIS — M109 Gout, unspecified: Secondary | ICD-10-CM | POA: Diagnosis not present

## 2020-08-08 DIAGNOSIS — F4310 Post-traumatic stress disorder, unspecified: Secondary | ICD-10-CM | POA: Diagnosis not present

## 2020-08-08 DIAGNOSIS — F419 Anxiety disorder, unspecified: Secondary | ICD-10-CM | POA: Diagnosis not present

## 2020-08-08 DIAGNOSIS — F331 Major depressive disorder, recurrent, moderate: Secondary | ICD-10-CM | POA: Diagnosis not present

## 2020-08-08 DIAGNOSIS — G4700 Insomnia, unspecified: Secondary | ICD-10-CM | POA: Diagnosis not present

## 2020-08-09 DIAGNOSIS — I10 Essential (primary) hypertension: Secondary | ICD-10-CM | POA: Diagnosis not present

## 2020-08-09 DIAGNOSIS — L03119 Cellulitis of unspecified part of limb: Secondary | ICD-10-CM | POA: Diagnosis not present

## 2020-08-09 DIAGNOSIS — E113293 Type 2 diabetes mellitus with mild nonproliferative diabetic retinopathy without macular edema, bilateral: Secondary | ICD-10-CM | POA: Diagnosis not present

## 2020-08-09 DIAGNOSIS — J449 Chronic obstructive pulmonary disease, unspecified: Secondary | ICD-10-CM | POA: Diagnosis not present

## 2020-08-16 DIAGNOSIS — E1169 Type 2 diabetes mellitus with other specified complication: Secondary | ICD-10-CM | POA: Diagnosis not present

## 2020-08-16 DIAGNOSIS — E876 Hypokalemia: Secondary | ICD-10-CM | POA: Diagnosis not present

## 2020-08-16 DIAGNOSIS — I10 Essential (primary) hypertension: Secondary | ICD-10-CM | POA: Diagnosis not present

## 2020-08-16 DIAGNOSIS — E785 Hyperlipidemia, unspecified: Secondary | ICD-10-CM | POA: Diagnosis not present

## 2020-08-16 DIAGNOSIS — D649 Anemia, unspecified: Secondary | ICD-10-CM | POA: Diagnosis not present

## 2020-08-16 DIAGNOSIS — E538 Deficiency of other specified B group vitamins: Secondary | ICD-10-CM | POA: Diagnosis not present

## 2020-08-23 DIAGNOSIS — Z760 Encounter for issue of repeat prescription: Secondary | ICD-10-CM | POA: Diagnosis not present

## 2020-08-23 DIAGNOSIS — M542 Cervicalgia: Secondary | ICD-10-CM | POA: Diagnosis not present

## 2020-08-23 DIAGNOSIS — G2581 Restless legs syndrome: Secondary | ICD-10-CM | POA: Diagnosis not present

## 2020-08-23 DIAGNOSIS — R201 Hypoesthesia of skin: Secondary | ICD-10-CM | POA: Diagnosis not present

## 2020-08-23 DIAGNOSIS — M545 Low back pain: Secondary | ICD-10-CM | POA: Diagnosis not present

## 2020-09-06 DIAGNOSIS — E876 Hypokalemia: Secondary | ICD-10-CM | POA: Diagnosis not present

## 2020-09-06 DIAGNOSIS — I10 Essential (primary) hypertension: Secondary | ICD-10-CM | POA: Diagnosis not present

## 2020-09-06 DIAGNOSIS — G4733 Obstructive sleep apnea (adult) (pediatric): Secondary | ICD-10-CM | POA: Diagnosis not present

## 2020-09-06 DIAGNOSIS — E119 Type 2 diabetes mellitus without complications: Secondary | ICD-10-CM | POA: Diagnosis not present

## 2020-09-06 DIAGNOSIS — M109 Gout, unspecified: Secondary | ICD-10-CM | POA: Diagnosis not present

## 2020-09-06 DIAGNOSIS — M79671 Pain in right foot: Secondary | ICD-10-CM | POA: Diagnosis not present

## 2020-09-06 DIAGNOSIS — F419 Anxiety disorder, unspecified: Secondary | ICD-10-CM | POA: Diagnosis not present

## 2020-09-06 DIAGNOSIS — F329 Major depressive disorder, single episode, unspecified: Secondary | ICD-10-CM | POA: Diagnosis not present

## 2020-09-06 DIAGNOSIS — K449 Diaphragmatic hernia without obstruction or gangrene: Secondary | ICD-10-CM | POA: Diagnosis not present

## 2020-09-06 DIAGNOSIS — E039 Hypothyroidism, unspecified: Secondary | ICD-10-CM | POA: Diagnosis not present

## 2020-09-06 DIAGNOSIS — I2510 Atherosclerotic heart disease of native coronary artery without angina pectoris: Secondary | ICD-10-CM | POA: Diagnosis not present

## 2020-09-06 DIAGNOSIS — J449 Chronic obstructive pulmonary disease, unspecified: Secondary | ICD-10-CM | POA: Diagnosis not present

## 2020-09-06 DIAGNOSIS — G8929 Other chronic pain: Secondary | ICD-10-CM | POA: Diagnosis not present

## 2020-09-06 DIAGNOSIS — D649 Anemia, unspecified: Secondary | ICD-10-CM | POA: Diagnosis not present

## 2020-09-06 DIAGNOSIS — E1142 Type 2 diabetes mellitus with diabetic polyneuropathy: Secondary | ICD-10-CM | POA: Diagnosis not present

## 2020-09-06 DIAGNOSIS — G43909 Migraine, unspecified, not intractable, without status migrainosus: Secondary | ICD-10-CM | POA: Diagnosis not present

## 2020-09-06 DIAGNOSIS — L03116 Cellulitis of left lower limb: Secondary | ICD-10-CM | POA: Diagnosis not present

## 2020-09-06 DIAGNOSIS — M545 Low back pain: Secondary | ICD-10-CM | POA: Diagnosis not present

## 2020-09-06 DIAGNOSIS — M1990 Unspecified osteoarthritis, unspecified site: Secondary | ICD-10-CM | POA: Diagnosis not present

## 2020-09-06 DIAGNOSIS — M79672 Pain in left foot: Secondary | ICD-10-CM | POA: Diagnosis not present

## 2020-09-06 DIAGNOSIS — K5909 Other constipation: Secondary | ICD-10-CM | POA: Diagnosis not present

## 2020-09-06 DIAGNOSIS — E785 Hyperlipidemia, unspecified: Secondary | ICD-10-CM | POA: Diagnosis not present

## 2020-09-06 DIAGNOSIS — K589 Irritable bowel syndrome without diarrhea: Secondary | ICD-10-CM | POA: Diagnosis not present

## 2020-09-06 DIAGNOSIS — E559 Vitamin D deficiency, unspecified: Secondary | ICD-10-CM | POA: Diagnosis not present

## 2020-09-06 DIAGNOSIS — G2581 Restless legs syndrome: Secondary | ICD-10-CM | POA: Diagnosis not present

## 2020-09-12 DIAGNOSIS — F331 Major depressive disorder, recurrent, moderate: Secondary | ICD-10-CM | POA: Diagnosis not present

## 2020-09-12 DIAGNOSIS — G4700 Insomnia, unspecified: Secondary | ICD-10-CM | POA: Diagnosis not present

## 2020-09-12 DIAGNOSIS — F419 Anxiety disorder, unspecified: Secondary | ICD-10-CM | POA: Diagnosis not present

## 2020-09-12 DIAGNOSIS — F4310 Post-traumatic stress disorder, unspecified: Secondary | ICD-10-CM | POA: Diagnosis not present

## 2020-09-20 DIAGNOSIS — E1142 Type 2 diabetes mellitus with diabetic polyneuropathy: Secondary | ICD-10-CM | POA: Diagnosis not present

## 2020-09-20 DIAGNOSIS — M7742 Metatarsalgia, left foot: Secondary | ICD-10-CM | POA: Diagnosis not present

## 2020-09-20 DIAGNOSIS — R600 Localized edema: Secondary | ICD-10-CM | POA: Diagnosis not present

## 2020-09-20 DIAGNOSIS — M7741 Metatarsalgia, right foot: Secondary | ICD-10-CM | POA: Diagnosis not present

## 2020-09-20 DIAGNOSIS — I872 Venous insufficiency (chronic) (peripheral): Secondary | ICD-10-CM | POA: Diagnosis not present

## 2020-09-20 DIAGNOSIS — R269 Unspecified abnormalities of gait and mobility: Secondary | ICD-10-CM | POA: Diagnosis not present

## 2020-09-27 DIAGNOSIS — R918 Other nonspecific abnormal finding of lung field: Secondary | ICD-10-CM | POA: Diagnosis not present

## 2020-09-27 DIAGNOSIS — Z87891 Personal history of nicotine dependence: Secondary | ICD-10-CM | POA: Diagnosis not present

## 2020-10-05 DIAGNOSIS — J4530 Mild persistent asthma, uncomplicated: Secondary | ICD-10-CM | POA: Diagnosis not present

## 2020-10-05 DIAGNOSIS — R911 Solitary pulmonary nodule: Secondary | ICD-10-CM | POA: Diagnosis not present

## 2020-10-11 DIAGNOSIS — E039 Hypothyroidism, unspecified: Secondary | ICD-10-CM | POA: Diagnosis not present

## 2020-10-11 DIAGNOSIS — Z01419 Encounter for gynecological examination (general) (routine) without abnormal findings: Secondary | ICD-10-CM | POA: Diagnosis not present

## 2020-10-11 DIAGNOSIS — Z0000 Encounter for general adult medical examination without abnormal findings: Secondary | ICD-10-CM | POA: Diagnosis not present

## 2020-10-11 DIAGNOSIS — Z23 Encounter for immunization: Secondary | ICD-10-CM | POA: Diagnosis not present

## 2020-10-11 DIAGNOSIS — E785 Hyperlipidemia, unspecified: Secondary | ICD-10-CM | POA: Diagnosis not present

## 2020-10-11 DIAGNOSIS — I10 Essential (primary) hypertension: Secondary | ICD-10-CM | POA: Diagnosis not present

## 2020-10-11 DIAGNOSIS — E1169 Type 2 diabetes mellitus with other specified complication: Secondary | ICD-10-CM | POA: Diagnosis not present

## 2020-10-17 DIAGNOSIS — R072 Precordial pain: Secondary | ICD-10-CM | POA: Diagnosis not present

## 2020-10-17 DIAGNOSIS — I2510 Atherosclerotic heart disease of native coronary artery without angina pectoris: Secondary | ICD-10-CM | POA: Diagnosis not present

## 2020-10-17 DIAGNOSIS — J449 Chronic obstructive pulmonary disease, unspecified: Secondary | ICD-10-CM | POA: Diagnosis not present

## 2020-10-17 DIAGNOSIS — I10 Essential (primary) hypertension: Secondary | ICD-10-CM | POA: Diagnosis not present

## 2020-10-17 DIAGNOSIS — Z955 Presence of coronary angioplasty implant and graft: Secondary | ICD-10-CM | POA: Diagnosis not present

## 2020-11-03 DIAGNOSIS — I10 Essential (primary) hypertension: Secondary | ICD-10-CM | POA: Diagnosis not present

## 2020-11-03 DIAGNOSIS — R9431 Abnormal electrocardiogram [ECG] [EKG]: Secondary | ICD-10-CM | POA: Diagnosis not present

## 2020-11-03 DIAGNOSIS — E118 Type 2 diabetes mellitus with unspecified complications: Secondary | ICD-10-CM | POA: Diagnosis not present

## 2020-11-03 DIAGNOSIS — I499 Cardiac arrhythmia, unspecified: Secondary | ICD-10-CM | POA: Diagnosis not present

## 2020-11-08 DIAGNOSIS — Z23 Encounter for immunization: Secondary | ICD-10-CM | POA: Diagnosis not present

## 2020-11-16 DIAGNOSIS — R072 Precordial pain: Secondary | ICD-10-CM | POA: Diagnosis not present

## 2020-11-16 DIAGNOSIS — I2510 Atherosclerotic heart disease of native coronary artery without angina pectoris: Secondary | ICD-10-CM | POA: Diagnosis not present

## 2020-11-16 DIAGNOSIS — Z955 Presence of coronary angioplasty implant and graft: Secondary | ICD-10-CM | POA: Diagnosis not present

## 2020-11-29 DIAGNOSIS — M5417 Radiculopathy, lumbosacral region: Secondary | ICD-10-CM | POA: Diagnosis not present

## 2020-11-29 DIAGNOSIS — W19XXXA Unspecified fall, initial encounter: Secondary | ICD-10-CM | POA: Diagnosis not present

## 2020-11-29 DIAGNOSIS — R413 Other amnesia: Secondary | ICD-10-CM | POA: Diagnosis not present

## 2020-11-29 DIAGNOSIS — M5412 Radiculopathy, cervical region: Secondary | ICD-10-CM | POA: Diagnosis not present

## 2020-12-11 DIAGNOSIS — R2689 Other abnormalities of gait and mobility: Secondary | ICD-10-CM | POA: Diagnosis not present

## 2020-12-11 DIAGNOSIS — R413 Other amnesia: Secondary | ICD-10-CM | POA: Diagnosis not present

## 2020-12-24 DIAGNOSIS — R609 Edema, unspecified: Secondary | ICD-10-CM | POA: Diagnosis not present

## 2020-12-25 DIAGNOSIS — Z87891 Personal history of nicotine dependence: Secondary | ICD-10-CM | POA: Diagnosis not present

## 2020-12-25 DIAGNOSIS — Z881 Allergy status to other antibiotic agents status: Secondary | ICD-10-CM | POA: Diagnosis not present

## 2020-12-25 DIAGNOSIS — E785 Hyperlipidemia, unspecified: Secondary | ICD-10-CM | POA: Diagnosis not present

## 2020-12-25 DIAGNOSIS — Z7951 Long term (current) use of inhaled steroids: Secondary | ICD-10-CM | POA: Diagnosis not present

## 2020-12-25 DIAGNOSIS — Z7984 Long term (current) use of oral hypoglycemic drugs: Secondary | ICD-10-CM | POA: Diagnosis not present

## 2020-12-25 DIAGNOSIS — G4733 Obstructive sleep apnea (adult) (pediatric): Secondary | ICD-10-CM | POA: Diagnosis not present

## 2020-12-25 DIAGNOSIS — Z91048 Other nonmedicinal substance allergy status: Secondary | ICD-10-CM | POA: Diagnosis not present

## 2020-12-25 DIAGNOSIS — R600 Localized edema: Secondary | ICD-10-CM | POA: Diagnosis not present

## 2020-12-25 DIAGNOSIS — Z888 Allergy status to other drugs, medicaments and biological substances status: Secondary | ICD-10-CM | POA: Diagnosis not present

## 2020-12-25 DIAGNOSIS — I10 Essential (primary) hypertension: Secondary | ICD-10-CM | POA: Diagnosis not present

## 2020-12-25 DIAGNOSIS — Z79899 Other long term (current) drug therapy: Secondary | ICD-10-CM | POA: Diagnosis not present

## 2020-12-25 DIAGNOSIS — J449 Chronic obstructive pulmonary disease, unspecified: Secondary | ICD-10-CM | POA: Diagnosis not present

## 2020-12-25 DIAGNOSIS — Z7982 Long term (current) use of aspirin: Secondary | ICD-10-CM | POA: Diagnosis not present

## 2020-12-25 DIAGNOSIS — Z794 Long term (current) use of insulin: Secondary | ICD-10-CM | POA: Diagnosis not present

## 2020-12-25 DIAGNOSIS — R0602 Shortness of breath: Secondary | ICD-10-CM | POA: Diagnosis not present

## 2020-12-25 DIAGNOSIS — Z955 Presence of coronary angioplasty implant and graft: Secondary | ICD-10-CM | POA: Diagnosis not present

## 2020-12-25 DIAGNOSIS — E119 Type 2 diabetes mellitus without complications: Secondary | ICD-10-CM | POA: Diagnosis not present

## 2020-12-25 DIAGNOSIS — E039 Hypothyroidism, unspecified: Secondary | ICD-10-CM | POA: Diagnosis not present

## 2020-12-25 DIAGNOSIS — R918 Other nonspecific abnormal finding of lung field: Secondary | ICD-10-CM | POA: Diagnosis not present

## 2020-12-25 DIAGNOSIS — Z79890 Hormone replacement therapy (postmenopausal): Secondary | ICD-10-CM | POA: Diagnosis not present

## 2020-12-26 DIAGNOSIS — R600 Localized edema: Secondary | ICD-10-CM | POA: Diagnosis not present

## 2020-12-26 DIAGNOSIS — R918 Other nonspecific abnormal finding of lung field: Secondary | ICD-10-CM | POA: Diagnosis not present

## 2020-12-26 DIAGNOSIS — R0602 Shortness of breath: Secondary | ICD-10-CM | POA: Diagnosis not present

## 2021-01-28 DIAGNOSIS — F419 Anxiety disorder, unspecified: Secondary | ICD-10-CM | POA: Diagnosis not present

## 2021-01-28 DIAGNOSIS — F4310 Post-traumatic stress disorder, unspecified: Secondary | ICD-10-CM | POA: Diagnosis not present

## 2021-01-28 DIAGNOSIS — G4700 Insomnia, unspecified: Secondary | ICD-10-CM | POA: Diagnosis not present

## 2021-01-28 DIAGNOSIS — F331 Major depressive disorder, recurrent, moderate: Secondary | ICD-10-CM | POA: Diagnosis not present

## 2021-02-12 DIAGNOSIS — J449 Chronic obstructive pulmonary disease, unspecified: Secondary | ICD-10-CM | POA: Diagnosis not present

## 2021-02-12 DIAGNOSIS — Z955 Presence of coronary angioplasty implant and graft: Secondary | ICD-10-CM | POA: Diagnosis not present

## 2021-02-12 DIAGNOSIS — I10 Essential (primary) hypertension: Secondary | ICD-10-CM | POA: Diagnosis not present

## 2021-02-12 DIAGNOSIS — I2510 Atherosclerotic heart disease of native coronary artery without angina pectoris: Secondary | ICD-10-CM | POA: Diagnosis not present

## 2021-02-12 DIAGNOSIS — E782 Mixed hyperlipidemia: Secondary | ICD-10-CM | POA: Diagnosis not present

## 2021-02-18 DIAGNOSIS — G40909 Epilepsy, unspecified, not intractable, without status epilepticus: Secondary | ICD-10-CM | POA: Diagnosis not present

## 2021-02-18 DIAGNOSIS — R296 Repeated falls: Secondary | ICD-10-CM | POA: Diagnosis not present

## 2021-02-18 DIAGNOSIS — Z955 Presence of coronary angioplasty implant and graft: Secondary | ICD-10-CM | POA: Diagnosis not present

## 2021-02-18 DIAGNOSIS — E1165 Type 2 diabetes mellitus with hyperglycemia: Secondary | ICD-10-CM | POA: Diagnosis not present

## 2021-02-18 DIAGNOSIS — I10 Essential (primary) hypertension: Secondary | ICD-10-CM | POA: Diagnosis not present

## 2021-02-18 DIAGNOSIS — E538 Deficiency of other specified B group vitamins: Secondary | ICD-10-CM | POA: Diagnosis not present

## 2021-02-18 DIAGNOSIS — E079 Disorder of thyroid, unspecified: Secondary | ICD-10-CM | POA: Diagnosis not present

## 2021-02-18 DIAGNOSIS — I2510 Atherosclerotic heart disease of native coronary artery without angina pectoris: Secondary | ICD-10-CM | POA: Diagnosis not present

## 2021-02-18 DIAGNOSIS — E782 Mixed hyperlipidemia: Secondary | ICD-10-CM | POA: Diagnosis not present

## 2021-02-18 DIAGNOSIS — M8949 Other hypertrophic osteoarthropathy, multiple sites: Secondary | ICD-10-CM | POA: Diagnosis not present

## 2021-02-18 DIAGNOSIS — J410 Simple chronic bronchitis: Secondary | ICD-10-CM | POA: Diagnosis not present

## 2021-02-21 DIAGNOSIS — E538 Deficiency of other specified B group vitamins: Secondary | ICD-10-CM | POA: Diagnosis not present

## 2021-03-07 DIAGNOSIS — R270 Ataxia, unspecified: Secondary | ICD-10-CM | POA: Diagnosis not present

## 2021-03-07 DIAGNOSIS — R413 Other amnesia: Secondary | ICD-10-CM | POA: Diagnosis not present

## 2021-03-07 DIAGNOSIS — M545 Low back pain, unspecified: Secondary | ICD-10-CM | POA: Diagnosis not present

## 2021-03-07 DIAGNOSIS — M5441 Lumbago with sciatica, right side: Secondary | ICD-10-CM | POA: Diagnosis not present

## 2021-03-07 DIAGNOSIS — Z79899 Other long term (current) drug therapy: Secondary | ICD-10-CM | POA: Diagnosis not present

## 2021-03-07 DIAGNOSIS — Z9989 Dependence on other enabling machines and devices: Secondary | ICD-10-CM | POA: Diagnosis not present

## 2021-03-07 DIAGNOSIS — M542 Cervicalgia: Secondary | ICD-10-CM | POA: Diagnosis not present

## 2021-04-10 DIAGNOSIS — E538 Deficiency of other specified B group vitamins: Secondary | ICD-10-CM | POA: Diagnosis not present

## 2021-05-21 DIAGNOSIS — R296 Repeated falls: Secondary | ICD-10-CM | POA: Diagnosis not present

## 2021-05-21 DIAGNOSIS — E039 Hypothyroidism, unspecified: Secondary | ICD-10-CM | POA: Diagnosis not present

## 2021-05-21 DIAGNOSIS — I10 Essential (primary) hypertension: Secondary | ICD-10-CM | POA: Diagnosis not present

## 2021-05-21 DIAGNOSIS — R413 Other amnesia: Secondary | ICD-10-CM | POA: Diagnosis not present

## 2021-05-21 DIAGNOSIS — J410 Simple chronic bronchitis: Secondary | ICD-10-CM | POA: Diagnosis not present

## 2021-05-21 DIAGNOSIS — D509 Iron deficiency anemia, unspecified: Secondary | ICD-10-CM | POA: Diagnosis not present

## 2021-05-21 DIAGNOSIS — M159 Polyosteoarthritis, unspecified: Secondary | ICD-10-CM | POA: Diagnosis not present

## 2021-05-21 DIAGNOSIS — D649 Anemia, unspecified: Secondary | ICD-10-CM | POA: Diagnosis not present

## 2021-05-21 DIAGNOSIS — G40909 Epilepsy, unspecified, not intractable, without status epilepticus: Secondary | ICD-10-CM | POA: Diagnosis not present

## 2021-05-21 DIAGNOSIS — I2510 Atherosclerotic heart disease of native coronary artery without angina pectoris: Secondary | ICD-10-CM | POA: Diagnosis not present

## 2021-05-21 DIAGNOSIS — E782 Mixed hyperlipidemia: Secondary | ICD-10-CM | POA: Diagnosis not present

## 2021-05-21 DIAGNOSIS — E538 Deficiency of other specified B group vitamins: Secondary | ICD-10-CM | POA: Diagnosis not present

## 2021-05-21 DIAGNOSIS — E1165 Type 2 diabetes mellitus with hyperglycemia: Secondary | ICD-10-CM | POA: Diagnosis not present

## 2021-05-28 DIAGNOSIS — I10 Essential (primary) hypertension: Secondary | ICD-10-CM | POA: Diagnosis not present

## 2021-05-28 DIAGNOSIS — Z6839 Body mass index (BMI) 39.0-39.9, adult: Secondary | ICD-10-CM | POA: Diagnosis not present

## 2021-05-28 DIAGNOSIS — M5416 Radiculopathy, lumbar region: Secondary | ICD-10-CM | POA: Diagnosis not present

## 2021-05-30 ENCOUNTER — Other Ambulatory Visit: Payer: Self-pay | Admitting: Student

## 2021-05-30 DIAGNOSIS — M5416 Radiculopathy, lumbar region: Secondary | ICD-10-CM

## 2021-06-03 ENCOUNTER — Other Ambulatory Visit: Payer: Self-pay

## 2021-06-03 ENCOUNTER — Ambulatory Visit (INDEPENDENT_AMBULATORY_CARE_PROVIDER_SITE_OTHER): Payer: Medicare Other

## 2021-06-03 DIAGNOSIS — M5416 Radiculopathy, lumbar region: Secondary | ICD-10-CM | POA: Diagnosis not present

## 2021-06-03 DIAGNOSIS — G8929 Other chronic pain: Secondary | ICD-10-CM | POA: Diagnosis not present

## 2021-06-03 DIAGNOSIS — M545 Low back pain, unspecified: Secondary | ICD-10-CM | POA: Diagnosis not present

## 2021-06-06 ENCOUNTER — Other Ambulatory Visit: Payer: Self-pay | Admitting: Student

## 2021-06-06 DIAGNOSIS — M5412 Radiculopathy, cervical region: Secondary | ICD-10-CM | POA: Diagnosis not present

## 2021-06-13 DIAGNOSIS — F419 Anxiety disorder, unspecified: Secondary | ICD-10-CM | POA: Diagnosis not present

## 2021-06-13 DIAGNOSIS — G4700 Insomnia, unspecified: Secondary | ICD-10-CM | POA: Diagnosis not present

## 2021-06-13 DIAGNOSIS — R413 Other amnesia: Secondary | ICD-10-CM | POA: Diagnosis not present

## 2021-06-13 DIAGNOSIS — F331 Major depressive disorder, recurrent, moderate: Secondary | ICD-10-CM | POA: Diagnosis not present

## 2021-06-13 DIAGNOSIS — Z79899 Other long term (current) drug therapy: Secondary | ICD-10-CM | POA: Diagnosis not present

## 2021-06-13 DIAGNOSIS — G250 Essential tremor: Secondary | ICD-10-CM | POA: Diagnosis not present

## 2021-06-13 DIAGNOSIS — F4310 Post-traumatic stress disorder, unspecified: Secondary | ICD-10-CM | POA: Diagnosis not present

## 2021-06-13 DIAGNOSIS — M542 Cervicalgia: Secondary | ICD-10-CM | POA: Diagnosis not present

## 2021-06-13 DIAGNOSIS — M545 Low back pain, unspecified: Secondary | ICD-10-CM | POA: Diagnosis not present

## 2021-06-15 ENCOUNTER — Ambulatory Visit (INDEPENDENT_AMBULATORY_CARE_PROVIDER_SITE_OTHER): Payer: Medicare Other

## 2021-06-15 ENCOUNTER — Other Ambulatory Visit: Payer: Self-pay

## 2021-06-15 DIAGNOSIS — M50123 Cervical disc disorder at C6-C7 level with radiculopathy: Secondary | ICD-10-CM | POA: Diagnosis not present

## 2021-06-15 DIAGNOSIS — M4802 Spinal stenosis, cervical region: Secondary | ICD-10-CM | POA: Diagnosis not present

## 2021-06-15 DIAGNOSIS — M4722 Other spondylosis with radiculopathy, cervical region: Secondary | ICD-10-CM | POA: Diagnosis not present

## 2021-06-15 DIAGNOSIS — M50122 Cervical disc disorder at C5-C6 level with radiculopathy: Secondary | ICD-10-CM | POA: Diagnosis not present

## 2021-06-15 DIAGNOSIS — M5412 Radiculopathy, cervical region: Secondary | ICD-10-CM

## 2021-06-18 DIAGNOSIS — Z20822 Contact with and (suspected) exposure to covid-19: Secondary | ICD-10-CM | POA: Diagnosis not present

## 2021-06-20 DIAGNOSIS — M5412 Radiculopathy, cervical region: Secondary | ICD-10-CM | POA: Diagnosis not present

## 2021-06-20 DIAGNOSIS — I10 Essential (primary) hypertension: Secondary | ICD-10-CM | POA: Diagnosis not present

## 2021-06-20 DIAGNOSIS — Z6839 Body mass index (BMI) 39.0-39.9, adult: Secondary | ICD-10-CM | POA: Diagnosis not present

## 2021-06-28 ENCOUNTER — Other Ambulatory Visit: Payer: Self-pay | Admitting: Neurological Surgery

## 2021-06-28 DIAGNOSIS — M4802 Spinal stenosis, cervical region: Secondary | ICD-10-CM

## 2021-07-09 DIAGNOSIS — Z01818 Encounter for other preprocedural examination: Secondary | ICD-10-CM | POA: Diagnosis not present

## 2021-07-09 DIAGNOSIS — R911 Solitary pulmonary nodule: Secondary | ICD-10-CM | POA: Diagnosis not present

## 2021-07-09 DIAGNOSIS — J4530 Mild persistent asthma, uncomplicated: Secondary | ICD-10-CM | POA: Diagnosis not present

## 2021-07-09 DIAGNOSIS — G4733 Obstructive sleep apnea (adult) (pediatric): Secondary | ICD-10-CM | POA: Diagnosis not present

## 2021-07-09 DIAGNOSIS — J45909 Unspecified asthma, uncomplicated: Secondary | ICD-10-CM | POA: Diagnosis not present

## 2021-07-16 ENCOUNTER — Emergency Department (INDEPENDENT_AMBULATORY_CARE_PROVIDER_SITE_OTHER)
Admission: EM | Admit: 2021-07-16 | Discharge: 2021-07-16 | Disposition: A | Discharge status: Home / Self Care | Payer: Medicare Other | Source: Home / Self Care | Attending: Family Medicine | Admitting: Family Medicine

## 2021-07-16 ENCOUNTER — Other Ambulatory Visit: Payer: Self-pay

## 2021-07-16 DIAGNOSIS — L03116 Cellulitis of left lower limb: Secondary | ICD-10-CM | POA: Diagnosis not present

## 2021-07-16 DIAGNOSIS — M7989 Other specified soft tissue disorders: Secondary | ICD-10-CM

## 2021-07-16 MED ORDER — FUROSEMIDE 20 MG PO TABS: 20.0000 mg | ORAL_TABLET | Freq: Every day | ORAL | 0 refills | Status: AC

## 2021-07-16 MED ORDER — DOXYCYCLINE HYCLATE 100 MG PO CAPS: 100.0000 mg | ORAL_CAPSULE | Freq: Two times a day (BID) | ORAL | 0 refills | Status: DC

## 2021-07-16 NOTE — Discharge Instructions (Addendum)
Take Lasix in the morning. Call your primary care doctor for refills Elevate left foot and apply compression sock until swelling is controlled Take antibiotic 2 times a day Follow-up with your primary care doctor if you fail to improve

## 2021-07-16 NOTE — ED Provider Notes (Signed)
[facility]    CSN: [id number] Arrival date & time: 07/16/21  1829      History   Chief Complaint Chief Complaint  Patient presents with   Foot Pain   Foot Swelling    HPI Jenna Butler is a 70 y.o. female.   HPI Patient has swelling and redness of her left foot.  She is an insulin-dependent diabetic.  She does not have any wound.  She has very little pain.  She does have a history of neuropathy.  She is worried because she has spine surgery scheduled for next week.  She does not want anything to stand in the way.  She used to take Lasix.  She does not know why she is out of this.  When I look in her medical record it looks like she was taking it as recent as 2021.  She does not have a history of kidney failure.  No contraindications to Lasix.  No history of hypokalemia or or electrolyte disturbance.  Patient has multiple medical problems, multiple medications.  She states that she is stable, or they would not do surgery.  Other than her left foot she feels well.  No fever.  No limp. Past Medical History:  Diagnosis Date   Anemia    Anxiety    Arthritis    Asthma    COPD (chronic obstructive pulmonary disease) (HCC)    Coronary artery disease    Depression    Diabetes mellitus without complication (HCC)    Dysrhythmia    Family history of adverse reaction to anesthesia    mother woke up during surgery   Fibromyalgia    Headache    tension headaches and migraines   Hypertension    Hypothyroidism    Neuropathy    both hands, arms, feet and legs   Restless legs    Seizures (HCC)    small, focal type seizures, on Dilantin and Lamictal   Sleep apnea    uses CPAP, haven't used CPAP in a while, getting a new one    Patient Active Problem List   Diagnosis Date Noted   S/P lumbar spinal fusion 11/27/2016   S/P lumbar microdiscectomy 11/03/2014    Past Surgical History:  Procedure Laterality Date   ABDOMINAL HYSTERECTOMY  2000   APPENDECTOMY  1972   BACK  SURGERY     CARDIAC CATHETERIZATION  2005   1 stent [facility]   CHOLECYSTECTOMY  1974   CORONARY ANGIOPLASTY     HAND SURGERY     Plastic surgery   LUMBAR LAMINECTOMY/DECOMPRESSION MICRODISCECTOMY Left 11/03/2014   Procedure: Left Lumbar three/four Intra/Extraformainal Microdiskectomy ;  Surgeon: Tia Alert, MD;  Location: [facility];  Service: Neurosurgery;  Laterality: Left;   SHOULDER SURGERY     TONSILLECTOMY     TRANSFORAMINAL LUMBAR INTERBODY FUSION (TLIF) WITH PEDICLE SCREW FIXATION 3 LEVEL N/A 11/27/2016   Procedure: Lumbar three-four, Lumbar four-five, Lumbar five-Sacral one Posterior Lumbar Interbody Fusion;  Surgeon: Tia Alert, MD;  [facility];  Service: Neurosurgery;  Laterality: N/A;   TUBAL LIGATION      OB History   No obstetric history on file.      Home Medications    Prior to Admission medications   Medication Sig Start Date End Date Taking? Authorizing Provider  doxycycline (VIBRAMYCIN) 100 MG capsule Take 1 capsule (100 mg total) by mouth 2 (two) times daily. 07/16/21  Yes Eustace Moore, MD  albuterol (PROVENTIL HFA;VENTOLIN  HFA) 108 (90 BASE) MCG/ACT inhaler Inhale 2 puffs into the lungs every 6 (six) hours as needed for wheezing or shortness of breath.    [provider]  aspirin EC 81 MG tablet Take 81 mg by mouth daily.    [provider]  busPIRone (BUSPAR) 15 MG tablet Take 15-30 mg by mouth 2 (two) times daily. Take 15 mg in the morning and 30 mg in the evening.    [provider]  Calcium Carbonate (CALCIUM 600 PO) Take 1 tablet by mouth daily.    [provider]  Cholecalciferol (VITAMIN D3) 5000 units TABS Take 1 tablet by mouth 2 (two) times daily.    [provider]  clonazePAM (KLONOPIN) 0.5 MG tablet Take 1 mg by mouth every evening.     [provider]  furosemide (LASIX) 20 MG tablet Take 1 tablet (20 mg total) by mouth daily. Has not had Rx recently--would like a  refill 07/16/21   Eustace Moore, MD  gabapentin (NEURONTIN) 300 MG capsule Take 600 mg by mouth 4 (four) times daily.     [provider]  gemfibrozil (LOPID) 600 MG tablet Take 600 mg by mouth 2 (two) times daily before a meal.    [provider]  glipiZIDE (GLUCOTROL) 10 MG tablet Take 10 mg by mouth 2 (two) times daily before a meal.    [provider]  glucose 4 GM chewable tablet Chew 1 tablet by mouth as needed for low blood sugar.    [provider]  insulin glargine (LANTUS) 100 UNIT/ML injection Inject 21 Units into the skin at bedtime.    [provider]  lamoTRIgine (LAMICTAL) 150 MG tablet Take 150 mg by mouth 2 (two) times daily.    [provider]  levothyroxine (SYNTHROID, LEVOTHROID) 200 MCG tablet Take 200 mcg by mouth daily before breakfast.    [provider]  lisinopril (PRINIVIL,ZESTRIL) 20 MG tablet Take 10 mg by mouth daily.    [provider]  metoprolol succinate (TOPROL-XL) 25 MG 24 hr tablet Take 25 mg by mouth daily.  08/26/16   [provider]  montelukast (SINGULAIR) 10 MG tablet Take 10 mg by mouth every morning.    [provider]  phenytoin (DILANTIN) 100 MG ER capsule Take 100 mg by mouth 3 (three) times daily.    [provider]  traZODone (DESYREL) 150 MG tablet Take 150 mg by mouth at bedtime.    [provider]  venlafaxine XR (EFFEXOR-XR) 150 MG 24 hr capsule Take 150 mg by mouth daily with breakfast.  09/15/16   [provider]  venlafaxine XR (EFFEXOR-XR) 75 MG 24 hr capsule Take 75 mg by mouth daily with breakfast.  09/15/16   [provider]    Family History Family History  Problem Relation Age of Onset   Heart attack Mother    Hypertension Mother    Alzheimer's disease Mother     Social History Social History   Tobacco Use   Smoking status: Former    Packs/day: 2.00    Years: 30.00    Pack years: 60.00    Types:  Cigarettes    Quit date: 10/30/1997    Years since quitting: 23.7   Smokeless tobacco: Never  Vaping Use   Vaping Use: Never used  Substance Use Topics   Alcohol use: No   Drug use: No     Allergies   Amoxicillin, Ceclor [cefaclor], Lyrica [pregabalin], and Tape  Review of Systems Review of Systems See HPI  Physical Exam Triage Vital Signs ED Triage Vitals  Enc Vitals Group     BP 07/16/21 1851 118/81     Pulse Rate 07/16/21 1851 79     Resp 07/16/21 1851 20     Temp 07/16/21 1851 98.2 F (36.8 C)     Temp Source 07/16/21 1851 Oral     SpO2 07/16/21 1851 96 %     Weight 07/16/21 1847 245 lb (111.1 kg)     Height 07/16/21 1847 5\' 5"  (1.651 m)     Head Circumference --      Peak Flow --      Pain Score 07/16/21 1847 3     Pain Loc --      Pain Edu? --      Excl. in GC? --    No data found.  Updated Vital Signs BP 118/81 (BP Location: Left Arm)   Pulse 79   Temp 98.2 F (36.8 C) (Oral)   Resp 20   Ht 5\' 5"  (1.651 m)   Wt 111.1 kg   SpO2 96%   BMI 40.77 kg/m      Physical Exam Constitutional:      General: She is not in acute distress.    Appearance: She is well-developed. She is obese.     Comments: No acute distress.  Uses walker for ambulation  HENT:     Head: Normocephalic and atraumatic.  Eyes:     Conjunctiva/sclera: Conjunctivae normal.     Pupils: Pupils are equal, round, and reactive to light.  Cardiovascular:     Rate and Rhythm: Normal rate.  Pulmonary:     Effort: Pulmonary effort is normal. No respiratory distress.  Abdominal:     General: There is no distension.     Palpations: Abdomen is soft.  Musculoskeletal:        General: Normal range of motion.     Cervical back: Normal range of motion.       Feet:  Skin:    General: Skin is warm and dry.  Neurological:     Mental Status: She is alert.     UC Treatments / Results  Labs (all labs ordered are listed, but only abnormal results are displayed) Labs Reviewed - No data  to display  EKG   Radiology No results found.  Procedures Procedures (including critical care time)  Medications Ordered in UC Medications - No data to display  Initial Impression / Assessment and Plan / UC Course  I have reviewed the triage vital signs and the nursing notes.  Pertinent labs & imaging results that were available during my care of the patient were reviewed by me and considered in my medical decision making (see chart for details).     Unilateral edema in the left foot that is quite puffy.  Moderate tenderness, not as much as I would expect for cellulitis although patient does have some neuropathy.  No open wound to suggest infection.  We will treat with doxycycline twice daily for 5 days.  Needs to see her PCP if she needs additional medication.  I did refill her Lasix for 10 pills.  She needs to talk to her PCP again if she needs a refill of this.  Discussed elevation and compression stockings for the edema.  Return as needed Final Clinical Impressions(s) / UC Diagnoses   Final diagnoses:  None     Discharge Instructions  Take Lasix in the morning. Call your primary care doctor for refills Elevate left foot and apply compression sock until swelling is controlled Take antibiotic 2 times a day Follow-up with your primary care doctor if you fail to improve     ED Prescriptions     Medication Sig Dispense Auth. Provider   furosemide (LASIX) 20 MG tablet Take 1 tablet (20 mg total) by mouth daily. Has not had Rx recently--would like a refill 10 tablet Eustace Moore, MD   doxycycline (VIBRAMYCIN) 100 MG capsule Take 1 capsule (100 mg total) by mouth 2 (two) times daily. 10 capsule Eustace Moore, MD      I have reviewed the PDMP during this encounter.   Eustace Moore, MD 07/16/21 1924

## 2021-07-16 NOTE — ED Triage Notes (Signed)
Pt presents to Urgent Care with c/o L foot pain, swelling, and redness x 3 days. Does not recall injury.

## 2021-07-19 NOTE — Progress Notes (Signed)
Surgical Instructions    Your procedure is scheduled on July 24, 2021.  Report to [facility] Main Entrance "A" at 05:30 A.M., then check in with the Admitting office.  Call this number if you have problems the morning of surgery:  [phone]   If you have any questions prior to your surgery date call [phone]: Open Monday-Friday 8am-4pm    Remember:  Do not eat or drink after midnight the night before your surgery    Take these medicines the morning of surgery with A SIP OF WATER : Buspirone (Buspar) Cetirizine (Zyrtec) Doxcycline (Vibramycin) Gemfibrozil (Lopid) Lamotrigine (Lamictal) Levothyroxine (Synthroid) Metoprolol Succinate (Toprol-XL) Montelukast (Singulair) Phenytoin (Dilantin) Topirmate (Topamax) Venlafaxine XR (Effexor-XR)   If needed: Albuterol (Proventil HFA;Ventolin HFA) inhaler-- Please bring all inhalers with you the day of surgery.  Clonazepam (Klonopin) Gabapentin (Neurontin)  As of today, STOP taking any Aspirin (unless otherwise instructed by your surgeon) Meloxicam (MOBIC), Aleve, Naproxen, Ibuprofen, Motrin, Advil, Goody's, BC's, all herbal medications, fish oil, and all vitamins.  WHAT DO I DO ABOUT MY DIABETES MEDICATION?  DO NOT TAKE YOUR EVENING DOSE OF GLIPIZIDE (GLUCOTROL) THE NIGHT BEFORE SURGERY ON July 26.  THE NIGHT BEFORE SURGERY on July 26, take a HALF DOSE OF LANTUS INSULIN which is 4 units    Do not take oral diabetes medicines (pills) the morning of surgery. DO NOT TAKE GLIPIZIDE (GLUCOTROL) THE MORNING OF SURGERY   HOW TO MANAGE YOUR DIABETES BEFORE AND AFTER SURGERY  Why is it important to control my blood sugar before and after surgery? Improving blood sugar levels before and after surgery helps healing and can limit problems. A way of improving blood sugar control is eating a healthy diet by:  Eating less sugar and carbohydrates  Increasing activity/exercise  Talking with your doctor about reaching your blood sugar  goals High blood sugars (greater than 180 mg/dL) can raise your risk of infections and slow your recovery, so you will need to focus on controlling your diabetes during the weeks before surgery. Make sure that the doctor who takes care of your diabetes knows about your planned surgery including the date and location.  How do I manage my blood sugar before surgery? Check your blood sugar at least 4 times a day, starting 2 days before surgery, to make sure that the level is not too high or low.  Check your blood sugar the morning of your surgery when you wake up and every 2 hours until you get to the Short Stay unit.  If your blood sugar is less than 70 mg/dL, you will need to treat for low blood sugar: Do not take insulin. Treat a low blood sugar (less than 70 mg/dL) with  cup of clear juice (cranberry or apple), 4 glucose tablets, OR glucose gel. Recheck blood sugar in 15 minutes after treatment (to make sure it is greater than 70 mg/dL). If your blood sugar is not greater than 70 mg/dL on recheck, call [phone] for further instructions. Report your blood sugar to the short stay nurse when you get to Short Stay.  If you are admitted to the hospital after surgery: Your blood sugar will be checked by the staff and you will probably be given insulin after surgery (instead of oral diabetes medicines) to make sure you have good blood sugar levels. The goal for blood sugar control after surgery is 80-180 mg/dL.                    Do  not wear jewelry or makeup. Do not wear lotions, powders, perfumes, or deodorant. Do not shave 48 hours prior to surgery.  Do not bring valuables to the hospital. Do not wear nail polish, gel polish, artificial nails, or any other type of covering on natural nails including finger and toenails. If patients have artificial nails, gel coating, etc. that need to be removed by a nail salon, please have this removed prior to surgery.  Surgery may need to be  canceled/delayed if the surgeon/ anesthesia feels like the patient is unable to be adequately monitored due to artificial nails, gel coating, etc.   [facility] is not responsible for any belongings or valuables.  Do NOT Smoke (Tobacco/Vaping) or drink Alcohol 24 hours prior to your procedure If you use a CPAP at night, you may bring all equipment for your overnight stay.   Contacts, glasses, dentures or bridgework may not be worn into surgery, please bring cases for these belongings   For patients admitted to the hospital, discharge time will be determined by your treatment team.   Patients discharged the day of surgery will not be allowed to drive home, and someone needs to stay with them for 24 hours.  ONLY 1 SUPPORT PERSON MAY BE PRESENT WHILE YOU ARE IN SURGERY. IF YOU ARE TO BE ADMITTED ONCE YOU ARE IN YOUR ROOM YOU WILL BE ALLOWED TWO (2) VISITORS.  Minor children may have two parents present. Special consideration for safety and communication needs will be reviewed on a case by case basis.  Special instructions:   [facility]- Preparing For Surgery  Before surgery, you can play an important role. Because skin is not sterile, your skin needs to be as free of germs as possible. You can reduce the number of germs on your skin by washing with CHG (chlorahexidine gluconate) Soap before surgery.  CHG is an antiseptic cleaner which kills germs and bonds with the skin to continue killing germs even after washing.    Oral Hygiene is also important to reduce your risk of infection.  Remember - BRUSH YOUR TEETH THE MORNING OF SURGERY WITH YOUR REGULAR TOOTHPASTE  Please do not use if you have an allergy to CHG or antibacterial soaps. If your skin becomes reddened/irritated stop using the CHG.  Do not shave (including legs and underarms) for at least 48 hours prior to first CHG shower. It is OK to shave your face.  Please follow these instructions carefully.   Shower the NIGHT BEFORE SURGERY  and the MORNING OF SURGERY  If you chose to wash your hair, wash your hair first as usual with your normal shampoo.  After you shampoo, rinse your hair and body thoroughly to remove the shampoo.  Wash Face and genitals (private parts) with your normal soap.   Use CHG Soap as you would any other liquid soap. You can apply CHG directly to the skin and wash gently with a scrungie or a clean washcloth.   Apply the CHG Soap to your body ONLY FROM THE NECK DOWN.  Do not use on open wounds or open sores. Avoid contact with your eyes, ears, mouth and genitals (private parts).   Wash thoroughly, paying special attention to the area where your surgery will be performed.  Thoroughly rinse your body with warm water from the neck down.  DO NOT shower/wash with your normal soap after using and rinsing off the CHG Soap.  Pat yourself dry with a CLEAN TOWEL.  Wear CLEAN PAJAMAS to bed  the night before surgery  Place CLEAN SHEETS on your bed the night before your surgery  DO NOT SLEEP WITH PETS.   Day of Surgery: Take a shower with CHG soap as directed above. Wear Clean/Comfortable clothing the morning of surgery Do not apply any deodorants/lotions.   Remember to brush your teeth WITH YOUR REGULAR TOOTHPASTE.   Please read over the following fact sheets that you were given.

## 2021-07-22 ENCOUNTER — Other Ambulatory Visit: Payer: Self-pay

## 2021-07-22 ENCOUNTER — Ambulatory Visit (HOSPITAL_COMMUNITY)
Admission: RE | Admit: 2021-07-22 | Discharge: 2021-07-22 | Disposition: A | Discharge status: Home / Self Care | Payer: Medicare Other | Source: Ambulatory Visit | Attending: Neurological Surgery | Admitting: Neurological Surgery

## 2021-07-22 ENCOUNTER — Encounter (HOSPITAL_COMMUNITY): Payer: Self-pay

## 2021-07-22 ENCOUNTER — Encounter (HOSPITAL_COMMUNITY)
Admission: RE | Admit: 2021-07-22 | Discharge: 2021-07-22 | Disposition: A | Discharge status: Home / Self Care | Payer: Medicare Other | Source: Ambulatory Visit | Attending: Neurological Surgery | Admitting: Neurological Surgery

## 2021-07-22 DIAGNOSIS — Z7982 Long term (current) use of aspirin: Secondary | ICD-10-CM | POA: Diagnosis not present

## 2021-07-22 DIAGNOSIS — Z794 Long term (current) use of insulin: Secondary | ICD-10-CM | POA: Insufficient documentation

## 2021-07-22 DIAGNOSIS — E119 Type 2 diabetes mellitus without complications: Secondary | ICD-10-CM | POA: Diagnosis not present

## 2021-07-22 DIAGNOSIS — J449 Chronic obstructive pulmonary disease, unspecified: Secondary | ICD-10-CM | POA: Diagnosis not present

## 2021-07-22 DIAGNOSIS — E669 Obesity, unspecified: Secondary | ICD-10-CM | POA: Insufficient documentation

## 2021-07-22 DIAGNOSIS — I10 Essential (primary) hypertension: Secondary | ICD-10-CM | POA: Diagnosis not present

## 2021-07-22 DIAGNOSIS — M797 Fibromyalgia: Secondary | ICD-10-CM | POA: Diagnosis not present

## 2021-07-22 DIAGNOSIS — Z9119 Patient's noncompliance with other medical treatment and regimen: Secondary | ICD-10-CM | POA: Diagnosis not present

## 2021-07-22 DIAGNOSIS — Z87891 Personal history of nicotine dependence: Secondary | ICD-10-CM | POA: Diagnosis not present

## 2021-07-22 DIAGNOSIS — M4802 Spinal stenosis, cervical region: Secondary | ICD-10-CM | POA: Insufficient documentation

## 2021-07-22 DIAGNOSIS — I2510 Atherosclerotic heart disease of native coronary artery without angina pectoris: Secondary | ICD-10-CM | POA: Diagnosis not present

## 2021-07-22 DIAGNOSIS — G4733 Obstructive sleep apnea (adult) (pediatric): Secondary | ICD-10-CM | POA: Diagnosis not present

## 2021-07-22 DIAGNOSIS — Z7984 Long term (current) use of oral hypoglycemic drugs: Secondary | ICD-10-CM | POA: Insufficient documentation

## 2021-07-22 DIAGNOSIS — Z791 Long term (current) use of non-steroidal anti-inflammatories (NSAID): Secondary | ICD-10-CM | POA: Insufficient documentation

## 2021-07-22 DIAGNOSIS — Z7901 Long term (current) use of anticoagulants: Secondary | ICD-10-CM | POA: Insufficient documentation

## 2021-07-22 DIAGNOSIS — Z01818 Encounter for other preprocedural examination: Secondary | ICD-10-CM | POA: Diagnosis not present

## 2021-07-22 DIAGNOSIS — Z6839 Body mass index (BMI) 39.0-39.9, adult: Secondary | ICD-10-CM | POA: Insufficient documentation

## 2021-07-22 DIAGNOSIS — Z79899 Other long term (current) drug therapy: Secondary | ICD-10-CM | POA: Insufficient documentation

## 2021-07-22 DIAGNOSIS — R569 Unspecified convulsions: Secondary | ICD-10-CM | POA: Diagnosis not present

## 2021-07-22 DIAGNOSIS — E039 Hypothyroidism, unspecified: Secondary | ICD-10-CM | POA: Insufficient documentation

## 2021-07-22 DIAGNOSIS — Z20822 Contact with and (suspected) exposure to covid-19: Secondary | ICD-10-CM | POA: Diagnosis not present

## 2021-07-22 HISTORY — DX: Dyspnea, unspecified: R06.00

## 2021-07-22 HISTORY — DX: Other nonspecific abnormal finding of lung field: R91.8

## 2021-07-22 LAB — BASIC METABOLIC PANEL
Anion gap: 9 (ref 5–15)
BUN: 47 mg/dL — ABNORMAL HIGH (ref 8–23)
CO2: 28 mmol/L (ref 22–32)
Calcium: 10.2 mg/dL (ref 8.9–10.3)
Chloride: 103 mmol/L (ref 98–111)
Creatinine, Ser: 1.12 mg/dL — ABNORMAL HIGH (ref 0.44–1.00)
GFR, Estimated: 53 mL/min — ABNORMAL LOW (ref >60)
Glucose, Bld: 106 mg/dL — ABNORMAL HIGH (ref 70–99)
Potassium: 3.4 mmol/L — ABNORMAL LOW (ref 3.5–5.1)
Sodium: 140 mmol/L (ref 135–145)

## 2021-07-22 LAB — PROTIME-INR
INR: 1.1 (ref 0.8–1.2)
Prothrombin Time: 14 seconds (ref 11.4–15.2)

## 2021-07-22 LAB — GLUCOSE, CAPILLARY: Glucose-Capillary: 130 mg/dL — ABNORMAL HIGH (ref 70–99)

## 2021-07-22 LAB — CBC WITH DIFFERENTIAL/PLATELET
Abs Immature Granulocytes: 0.03 10*3/uL (ref 0.00–0.07)
Basophils Absolute: 0.1 10*3/uL (ref 0.0–0.1)
Basophils Relative: 1 %
Eosinophils Absolute: 0.2 10*3/uL (ref 0.0–0.5)
Eosinophils Relative: 4 %
HCT: 40 % (ref 36.0–46.0)
Hemoglobin: 13 g/dL (ref 12.0–15.0)
Immature Granulocytes: 1 %
Lymphocytes Relative: 17 %
Lymphs Abs: 1 10*3/uL (ref 0.7–4.0)
MCH: 29.6 pg (ref 26.0–34.0)
MCHC: 32.5 g/dL (ref 30.0–36.0)
MCV: 91.1 fL (ref 80.0–100.0)
Monocytes Absolute: 0.8 10*3/uL (ref 0.1–1.0)
Monocytes Relative: 14 %
Neutro Abs: 3.8 10*3/uL (ref 1.7–7.7)
Neutrophils Relative %: 63 %
Platelets: 222 10*3/uL (ref 150–400)
RBC: 4.39 MIL/uL (ref 3.87–5.11)
RDW: 14.6 % (ref 11.5–15.5)
WBC: 5.9 10*3/uL (ref 4.0–10.5)
nRBC: 0 % (ref 0.0–0.2)

## 2021-07-22 LAB — SURGICAL PCR SCREEN
MRSA, PCR: NEGATIVE
Staphylococcus aureus: NEGATIVE

## 2021-07-22 LAB — SARS CORONAVIRUS 2 (TAT 6-24 HRS): SARS Coronavirus 2: NEGATIVE

## 2021-07-22 NOTE — Progress Notes (Signed)
Surgical Instructions    Your procedure is scheduled on July 24, 2021.  Report to [facility] Main Entrance "A" at 05:30 A.M., then check in with the Admitting office.  Call this number if you have problems the morning of surgery:  [phone]   If you have any questions prior to your surgery date call [phone]: Open Monday-Friday 8am-4pm    Remember:  Do not eat or drink after midnight the night before your surgery    Take these medicines the morning of surgery with A SIP OF WATER : Buspirone (Buspar) Cetirizine (Zyrtec) Doxcycline (Vibramycin) Gemfibrozil (Lopid) Lamotrigine (Lamictal) Levothyroxine (Synthroid) Metoprolol Succinate (Toprol-XL) Montelukast (Singulair) Phenytoin (Dilantin) Topirmate (Topamax) Venlafaxine XR (Effexor-XR)   If needed: Albuterol (Proventil HFA;Ventolin HFA) inhaler-- Please bring all inhalers with you the day of surgery.  Clonazepam (Klonopin) Gabapentin (Neurontin)  Follow your surgeon's instructions on when to stop Aspirin.  If no instructions were given by your surgeon then you will need to call the office to get those instructions.     As of today, STOP taking any Aspirin (unless otherwise instructed by your surgeon) Meloxicam (MOBIC), Aleve, Naproxen, Ibuprofen, Motrin, Advil, Goody's, BC's, all herbal medications, fish oil, and all vitamins.  WHAT DO I DO ABOUT MY DIABETES MEDICATION?  DO NOT TAKE YOUR EVENING DOSE OF GLIPIZIDE (GLUCOTROL) THE NIGHT BEFORE SURGERY ON July 26.  THE NIGHT BEFORE SURGERY on July 26, take a HALF DOSE OF LANTUS INSULIN which is 4 units    Do not take oral diabetes medicines (pills) the morning of surgery. DO NOT TAKE GLIPIZIDE (GLUCOTROL) THE MORNING OF SURGERY   HOW TO MANAGE YOUR DIABETES BEFORE AND AFTER SURGERY  Why is it important to control my blood sugar before and after surgery? Improving blood sugar levels before and after surgery helps healing and can limit problems. A way of improving  blood sugar control is eating a healthy diet by:  Eating less sugar and carbohydrates  Increasing activity/exercise  Talking with your doctor about reaching your blood sugar goals High blood sugars (greater than 180 mg/dL) can raise your risk of infections and slow your recovery, so you will need to focus on controlling your diabetes during the weeks before surgery. Make sure that the doctor who takes care of your diabetes knows about your planned surgery including the date and location.  How do I manage my blood sugar before surgery? Check your blood sugar at least 4 times a day, starting 2 days before surgery, to make sure that the level is not too high or low.  Check your blood sugar the morning of your surgery when you wake up and every 2 hours until you get to the Short Stay unit.  If your blood sugar is less than 70 mg/dL, you will need to treat for low blood sugar: Do not take insulin. Treat a low blood sugar (less than 70 mg/dL) with  cup of clear juice (cranberry or apple), 4 glucose tablets, OR glucose gel. Recheck blood sugar in 15 minutes after treatment (to make sure it is greater than 70 mg/dL). If your blood sugar is not greater than 70 mg/dL on recheck, call [phone] for further instructions. Report your blood sugar to the short stay nurse when you get to Short Stay.  If you are admitted to the hospital after surgery: Your blood sugar will be checked by the staff and you will probably be given insulin after surgery (instead of oral diabetes medicines) to make sure you have good  blood sugar levels. The goal for blood sugar control after surgery is 80-180 mg/dL.                    Do not wear jewelry or makeup. Do not wear lotions, powders, perfumes, or deodorant. Do not shave 48 hours prior to surgery.  Do not bring valuables to the hospital. Do not wear nail polish, gel polish, artificial nails, or any other type of covering on natural nails including finger and  toenails. If patients have artificial nails, gel coating, etc. that need to be removed by a nail salon, please have this removed prior to surgery.  Surgery may need to be canceled/delayed if the surgeon/ anesthesia feels like the patient is unable to be adequately monitored due to artificial nails, gel coating, etc.   [facility] is not responsible for any belongings or valuables.  Do NOT Smoke (Tobacco/Vaping) or drink Alcohol 24 hours prior to your procedure If you use a CPAP at night, you may bring all equipment for your overnight stay.   Contacts, glasses, dentures or bridgework may not be worn into surgery, please bring cases for these belongings   For patients admitted to the hospital, discharge time will be determined by your treatment team.   Patients discharged the day of surgery will not be allowed to drive home, and someone needs to stay with them for 24 hours.  ONLY 1 SUPPORT PERSON MAY BE PRESENT WHILE YOU ARE IN SURGERY. IF YOU ARE TO BE ADMITTED ONCE YOU ARE IN YOUR ROOM YOU WILL BE ALLOWED TWO (2) VISITORS.  Minor children may have two parents present. Special consideration for safety and communication needs will be reviewed on a case by case basis.  Special instructions:   [facility]- Preparing For Surgery  Before surgery, you can play an important role. Because skin is not sterile, your skin needs to be as free of germs as possible. You can reduce the number of germs on your skin by washing with CHG (chlorahexidine gluconate) Soap before surgery.  CHG is an antiseptic cleaner which kills germs and bonds with the skin to continue killing germs even after washing.    Oral Hygiene is also important to reduce your risk of infection.  Remember - BRUSH YOUR TEETH THE MORNING OF SURGERY WITH YOUR REGULAR TOOTHPASTE  Please do not use if you have an allergy to CHG or antibacterial soaps. If your skin becomes reddened/irritated stop using the CHG.  Do not shave (including legs and  underarms) for at least 48 hours prior to first CHG shower. It is OK to shave your face.  Please follow these instructions carefully.   Shower the NIGHT BEFORE SURGERY and the MORNING OF SURGERY  If you chose to wash your hair, wash your hair first as usual with your normal shampoo.  After you shampoo, rinse your hair and body thoroughly to remove the shampoo.  Wash Face and genitals (private parts) with your normal soap.   Use CHG Soap as you would any other liquid soap. You can apply CHG directly to the skin and wash gently with a scrungie or a clean washcloth.   Apply the CHG Soap to your body ONLY FROM THE NECK DOWN.  Do not use on open wounds or open sores. Avoid contact with your eyes, ears, mouth and genitals (private parts).   Wash thoroughly, paying special attention to the area where your surgery will be performed.  Thoroughly rinse your body with warm water  from the neck down.  DO NOT shower/wash with your normal soap after using and rinsing off the CHG Soap.  Pat yourself dry with a CLEAN TOWEL.  Wear CLEAN PAJAMAS to bed the night before surgery  Place CLEAN SHEETS on your bed the night before your surgery  DO NOT SLEEP WITH PETS.   Day of Surgery: Take a shower with CHG soap as directed above. Wear Clean/Comfortable clothing the morning of surgery Do not apply any deodorants/lotions.   Remember to brush your teeth WITH YOUR REGULAR TOOTHPASTE.   Please read over the following fact sheets that you were given.

## 2021-07-22 NOTE — Progress Notes (Signed)
PCP - Loyola Mast, MD [facility], Joylene Draft) Cardiologist - Vilinda Boehringer, MD  PPM/ICD - denies Device Orders - N/A Rep Notified - N/A  Chest x-ray - 07/22/2021 EKG - 07/22/2021 Stress Test - 11/15/2021 ECHO - 04/25/2013 Cardiac Cath - more than 5 years ago  Sleep Study - yes CPAP - yes, not using  Fasting Blood Sugar - 120-160 Checks Blood Sugar 2 times a day CBG today 130 A1C 6.4 on 05/21/2021  Blood Thinner Instructions: N/A Aspirin Instructions: Aspirin - last dose 07/18 per patient  Patient was instructed: As of today, STOP taking any Aspirin (unless otherwise instructed by your surgeon) Meloxicam (MOBIC), Aleve, Naproxen, Ibuprofen, Motrin, Advil, Goody's, BC's, all herbal medications, fish oil, and all vitamins.  ERAS Protcol - no PRE-SURGERY Ensure or G2- N/A  COVID TEST- 07/22/2021   Anesthesia review: yes; cardiac history - information requested from Dr. Vilinda Boehringer  Patient denies shortness of breath, fever, cough and chest pain at PAT appointment   All instructions explained to the patient, with a verbal understanding of the material. Patient agrees to go over the instructions while at home for a better understanding. Patient also instructed to self quarantine after being tested for COVID-19. The opportunity to ask questions was provided.

## 2021-07-23 ENCOUNTER — Encounter (HOSPITAL_COMMUNITY): Payer: Self-pay

## 2021-07-23 MED ORDER — VANCOMYCIN HCL 1500 MG/300ML IV SOLN
1500.0000 mg | INTRAVENOUS | Status: AC
  Administered 2021-07-24: 1500 mg via INTRAVENOUS
  Filled 2021-07-23 (×2): qty 300

## 2021-07-23 NOTE — Progress Notes (Signed)
Anesthesia Chart Review:  Case: [id number] Date/Time: 07/24/21 0715   Procedure: ACDF - C5-C6 - 3C   Anesthesia type: General   Pre-op diagnosis: Stenosis   [facility] / [facility]   Surgeons: Tia Alert, MD       DISCUSSION: Patient is a 70 year old female scheduled for the above procedure.  History includes former smoker (quit 10/30/97), HTN, CAD (s/p LAD stent ~ '05; s/p PCI to LAD 2008 mild disease elsewhere at that time according to cardiology notes), dysrhythmia (not specified), DM2, COPD, asthma, lung nodules, OSA (not using CPAP), RLS, fibromyalgia, seizures, neuropathy, hypothyroidism, anemia, back surgery (left L3-4 microdiscectomy/redo hemilaminectomy 11/03/14; L3-5 PLIF 11/27/16 . For anesthesia history, she reported her mother woke up during a surgery. BMI is consistent with obesity.   She saw her pulmonologist Dr. Steele Berg recently on 07/09/2021.  Her asthma was felt stable  Since she was noncompliant with CPAP, he did order a future overnight oximetry. In regards to surgery he wrote, "Should tolerate cervical spine surgery without undue risk. Concerned about her CPAP non compliance however."   Last cardiology evaluation 02/12/21 with Kayren Eaves, NP. "The patient is clinically stable from a cardiovascular standpoint. She is not having any symptoms to suggest angina. She will continue on her current medical regimen." Low risk stress test 11/16/20.   Urgent Care visit 07/16/21 for left foot edema. Per provider note, "Faint erythema and lateral foot near the crease of the fourth and fifth toes.  No open wound is seen..." and added, "Moderate tenderness, not as much as I would expect for cellulitis although patient does have some neuropathy.  No open wound to suggest infection.  We will treat with doxycycline twice daily for 5 days." She was also given a 10 day refill of Lasix 20 mg daily and advised to follow-up with PCP if symptoms failed to improved. I notified Erie Noe at Dr.  Yetta Barre' office, so he can follow-up as he feels indicated prior to surgery. CBC with differential normal 07/22/21.   Last ASA 07/15/21.  07/22/2021 presurgical COVID-19 test negative.  Anesthesia team to evaluate on the day of surgery.   VS: BP 129/65   Pulse 81   Temp 36.8 C (Oral)   Resp 19   Ht 5\' 4"  (1.626 m)   Wt 105.6 kg   SpO2 98%   BMI 39.94 kg/m    PROVIDERS: PCP - , MD [facility], 04-26-1969).  Last visit 05/21/2021. Cardiologist - 05/23/2021, MD. Last visit 02/12/21 with 02/14/21, NP with 8 month follow-up planned. ([facility]) Pulmonologist - Kayren Eaves, MD. Last visit 07/09/21 for follow-up mild persistent asthma, stable lung nodules followed yearly by CT, OSA with CPAP non-compliance. One year follow-up planned. ([facility])   LABS: Labs reviewed: Acceptable for surgery.  A1C 6.4 on 05/21/2021 ([facility]) (all labs ordered are listed, but only abnormal results are displayed)  Labs Reviewed  BASIC METABOLIC PANEL - Abnormal; Notable for the following components:      Result Value   Potassium 3.4 (*)    Glucose, Bld 106 (*)    BUN 47 (*)    Creatinine, Ser 1.12 (*)    GFR, Estimated 53 (*)    All other components within normal limits  GLUCOSE, CAPILLARY - Abnormal; Notable for the following components:   Glucose-Capillary 130 (*)    All other components within normal limits  SURGICAL PCR SCREEN  SARS CORONAVIRUS 2 (TAT 6-24 HRS)  CBC WITH DIFFERENTIAL/PLATELET  PROTIME-INR    Spirometry 07/09/21 ([facility]):  Impression: Normal spirometry   IMAGES: CXR 07/22/21: FINDINGS: - Cardiomediastinal silhouette unchanged in size and contour. No evidence of central vascular congestion. No interlobular septal thickening. - No pneumothorax or pleural effusion. Coarsened interstitial markings, with no confluent airspace disease. - No acute displaced fracture. Degenerative changes of the spine. IMPRESSION: No active  cardiopulmonary disease.  MRI C-spine 06/15/21: IMPRESSION: 1. Multilevel cervical spondylosis, most pronounced at the C5-6 level where there is mild-to-moderate canal stenosis and moderate bilateral foraminal stenosis. 2. Moderate-to-severe left foraminal stenosis at C3-4.  CT Chest 09/27/20 ([facility]): IMPRESSION:  - Stable size and appearance of many small pulmonary nodules..  - Stable left adrenal adenoma  RECOMMENDATIONS:  Continue annual Screening low dose chest CT (exam code IMG [phone]).  Lung-RADSTM CATEGORY:  Category 2, Benign     EKG: 07/22/21: Normal sinus rhythm Low voltage QRS Borderline ECG No significant change since last tracing Confirmed by End, Christopher [phone]) on 07/22/2021 9:45:05 PM   CV: NM Heart Spect Stress test 11/16/20 ([facility]):  IMPRESSION:  Essentially normal cardiac perfusion exam.  Left ventricular ejection fraction is calculated to be 84 %.   Dynamic imaging of the left ventricle demonstrates no wall motionabnormalities.  Prognostically this is a low risk scan.   LLE Venous US 08/01/20 ([facility]): Impression:  No DVT identified.   Echo 10/27/19 ([facility]): IMPRESSION: Right Ventricle: Right ventricle cavity appears normal.    Right Ventricle: Systolic function is normal.    Mitral Valve: There is mild annular calcification.    Mitral Valve: Mitral valve structure is normal. The leaflets are mildly  thickened and calcified and exhibit normal excursion.  Tip of anterior  leaflet of mitral valve appears calcified.    Aorta: The ascending aorta is at upper limit of normal to borderline  dilated 3.4cm.  Aortic root size is at upper limit of normal -3.5 cm.    Left Ventricle: Wall motion is within normal limits.    Left Ventricle: Systolic function is normal with an ejection fraction  of 60%.    Tricuspid Valve: The estimated pulmonary pressures are normal.    Aortic Valve: The aortic valve is probably tricuspid. The leaflets are  not  thickened and exhibit normal excursion.    Left Ventricle: Unable to assess diastolic function.  Reported last cath > 5 years ago.  Past Medical History:  Diagnosis Date   Anemia    Anxiety    Arthritis    Asthma    COPD (chronic obstructive pulmonary disease) (HCC)    Coronary artery disease    Depression    Diabetes mellitus without complication (HCC)    Dyspnea    Dysrhythmia    Family history of adverse reaction to anesthesia    mother woke up during surgery   Fibromyalgia    Headache    tension headaches and migraines   Hypertension    Hypothyroidism    Multiple lung nodules on CT    CT chest 09/27/20 ([facility]): Stable appearance of many small pulmonary nodules, followed by pulmonology   Neuropathy    both hands, arms, feet and legs   Restless legs    Seizures (HCC)    small, focal type seizures, on Dilantin and Lamictal   Sleep apnea    uses CPAP, haven't used CPAP in a while, getting a new one    Past Surgical History:  Procedure Laterality Date   ABDOMINAL HYSTERECTOMY  2000   APPENDECTOMY  1972   BACK SURGERY     CARDIAC CATHETERIZATION  2005   1 stent [facility]   CHOLECYSTECTOMY  1974   CORONARY ANGIOPLASTY     EYE SURGERY     HAND SURGERY     Plastic surgery   LUMBAR LAMINECTOMY/DECOMPRESSION MICRODISCECTOMY Left 11/03/2014   Procedure: Left Lumbar three/four Intra/Extraformainal Microdiskectomy ;  Surgeon: Tia Alert, MD;  Location: [facility];  Service: Neurosurgery;  Laterality: Left;   SHOULDER SURGERY     TONSILLECTOMY     TRANSFORAMINAL LUMBAR INTERBODY FUSION (TLIF) WITH PEDICLE SCREW FIXATION 3 LEVEL N/A 11/27/2016   Procedure: Lumbar three-four, Lumbar four-five, Lumbar five-Sacral one Posterior Lumbar Interbody Fusion;  Surgeon: Tia Alert, MD;  Location: [facility] OR;  Service: Neurosurgery;  Laterality: N/A;   TUBAL LIGATION      MEDICATIONS:  albuterol (PROVENTIL HFA;VENTOLIN HFA) 108 (90 BASE) MCG/ACT inhaler   aspirin EC  81 MG tablet   busPIRone (BUSPAR) 15 MG tablet   CALCIUM PO   cetirizine (ZYRTEC) 10 MG tablet   clonazePAM (KLONOPIN) 0.5 MG tablet   doxycycline (VIBRAMYCIN) 100 MG capsule   furosemide (LASIX) 20 MG tablet   gabapentin (NEURONTIN) 300 MG capsule   Garlic 1200 MG CAPS   gemfibrozil (LOPID) 600 MG tablet   glipiZIDE (GLUCOTROL) 10 MG tablet   glucose 4 GM chewable tablet   insulin glargine (LANTUS) 100 UNIT/ML injection   lamoTRIgine (LAMICTAL) 200 MG tablet   levothyroxine (SYNTHROID) 25 MCG tablet   levothyroxine (SYNTHROID, LEVOTHROID) 200 MCG tablet   lisinopril (PRINIVIL,ZESTRIL) 20 MG tablet   meloxicam (MOBIC) 7.5 MG tablet   metoprolol succinate (TOPROL-XL) 25 MG 24 hr tablet   montelukast (SINGULAIR) 10 MG tablet   phenytoin (DILANTIN) 100 MG ER capsule   topiramate (TOPAMAX) 50 MG tablet   traZODone (DESYREL) 100 MG tablet   triamterene-hydrochlorothiazide (MAXZIDE-25) 37.5-25 MG tablet   venlafaxine XR (EFFEXOR-XR) 150 MG 24 hr capsule   venlafaxine XR (EFFEXOR-XR) 37.5 MG 24 hr capsule   No current facility-administered medications for this encounter.    [START ON 07/24/2021] vancomycin (VANCOREADY) IVPB 1500 mg/300 mL    Shonna Chock, PA-C Surgical Short Stay/Anesthesiology [facility] Phone [phone] [facility] Phone [phone] 07/23/2021 5:01 PM

## 2021-07-23 NOTE — Anesthesia Preprocedure Evaluation (Addendum)
Anesthesia Evaluation  Patient identified by MRN, date of birth, ID band Patient awake    Reviewed: Allergy & Precautions, NPO status , Patient's Chart, lab work & pertinent test results  Airway Mallampati: II  TM Distance: >3 FB Neck ROM: Full    Dental  (+) Upper Dentures, Lower Dentures   Pulmonary asthma , sleep apnea and Continuous Positive Airway Pressure Ventilation , COPD,  COPD inhaler, former smoker,    Pulmonary exam normal        Cardiovascular hypertension, Pt. on medications and Pt. on home beta blockers + CAD and + Cardiac Stents   Rhythm:Regular Rate:Normal     Neuro/Psych Seizures -, Well Controlled,  Anxiety Depression    GI/Hepatic negative GI ROS, Neg liver ROS,   Endo/Other  diabetes, Type 2, Insulin Dependent, Oral Hypoglycemic AgentsHypothyroidism   Renal/GU negative Renal ROS  negative genitourinary   Musculoskeletal  (+) Arthritis , Osteoarthritis,  Fibromyalgia -  Abdominal (+)  Abdomen: soft. Bowel sounds: normal.  Peds  Hematology  (+) anemia ,   Anesthesia Other Findings   Reproductive/Obstetrics                           Anesthesia Physical Anesthesia Plan  ASA: 3  Anesthesia Plan: General   Post-op Pain Management:    Induction: Intravenous  PONV Risk Score and Plan: 3 and Ondansetron, Dexamethasone and Treatment may vary due to age or medical condition  Airway Management Planned: Mask and Oral ETT  Additional Equipment: None  Intra-op Plan:   Post-operative Plan: Extubation in OR  Informed Consent: I have reviewed the patients History and Physical, chart, labs and discussed the procedure including the risks, benefits and alternatives for the proposed anesthesia with the patient or authorized representative who has indicated his/her understanding and acceptance.     Dental advisory given  Plan Discussed with: CRNA  Anesthesia Plan Comments:  (PAT note written 07/23/2021 by Shonna Chock, PA-C. Lab Results      Component                Value               Date                      WBC                      5.9                 07/22/2021                HGB                      13.0                07/22/2021                HCT                      40.0                07/22/2021                MCV                      91.1  07/22/2021                PLT                      222                 07/22/2021           Lab Results      Component                Value               Date                      NA                       140                 07/22/2021                K                        3.4 (L)             07/22/2021                CO2                      28                  07/22/2021                GLUCOSE                  106 (H)             07/22/2021                BUN                      47 (H)              07/22/2021                CREATININE               1.12 (H)            07/22/2021                CALCIUM                  10.2                07/22/2021                GFRNONAA                 53 (L)              07/22/2021                GFRAA                    >60                 11/28/2016          )      Anesthesia Quick Evaluation

## 2021-07-24 ENCOUNTER — Ambulatory Visit (HOSPITAL_COMMUNITY): Payer: Medicare Other | Admitting: Physician Assistant

## 2021-07-24 ENCOUNTER — Other Ambulatory Visit: Payer: Self-pay

## 2021-07-24 ENCOUNTER — Encounter (HOSPITAL_COMMUNITY)
Admission: RE | Disposition: A | Discharge status: Home / Self Care | Payer: Self-pay | Source: Home / Self Care | Attending: Neurological Surgery

## 2021-07-24 ENCOUNTER — Ambulatory Visit (HOSPITAL_COMMUNITY): Payer: Medicare Other

## 2021-07-24 ENCOUNTER — Observation Stay (HOSPITAL_COMMUNITY)
Admission: RE | Admit: 2021-07-24 | Discharge: 2021-07-25 | Disposition: A | Discharge status: Home / Self Care | Payer: Medicare Other | Attending: Neurological Surgery | Admitting: Neurological Surgery

## 2021-07-24 ENCOUNTER — Encounter (HOSPITAL_COMMUNITY): Payer: Self-pay | Admitting: Neurological Surgery

## 2021-07-24 ENCOUNTER — Ambulatory Visit (HOSPITAL_COMMUNITY): Payer: Medicare Other | Admitting: Anesthesiology

## 2021-07-24 DIAGNOSIS — Z7982 Long term (current) use of aspirin: Secondary | ICD-10-CM | POA: Diagnosis not present

## 2021-07-24 DIAGNOSIS — E039 Hypothyroidism, unspecified: Secondary | ICD-10-CM | POA: Diagnosis not present

## 2021-07-24 DIAGNOSIS — Z87891 Personal history of nicotine dependence: Secondary | ICD-10-CM | POA: Insufficient documentation

## 2021-07-24 DIAGNOSIS — I2510 Atherosclerotic heart disease of native coronary artery without angina pectoris: Secondary | ICD-10-CM | POA: Diagnosis not present

## 2021-07-24 DIAGNOSIS — J45909 Unspecified asthma, uncomplicated: Secondary | ICD-10-CM | POA: Diagnosis not present

## 2021-07-24 DIAGNOSIS — J449 Chronic obstructive pulmonary disease, unspecified: Secondary | ICD-10-CM | POA: Insufficient documentation

## 2021-07-24 DIAGNOSIS — Z981 Arthrodesis status: Secondary | ICD-10-CM | POA: Diagnosis not present

## 2021-07-24 DIAGNOSIS — I10 Essential (primary) hypertension: Secondary | ICD-10-CM | POA: Diagnosis not present

## 2021-07-24 DIAGNOSIS — Z7984 Long term (current) use of oral hypoglycemic drugs: Secondary | ICD-10-CM | POA: Insufficient documentation

## 2021-07-24 DIAGNOSIS — E119 Type 2 diabetes mellitus without complications: Secondary | ICD-10-CM | POA: Insufficient documentation

## 2021-07-24 DIAGNOSIS — Z79899 Other long term (current) drug therapy: Secondary | ICD-10-CM | POA: Insufficient documentation

## 2021-07-24 DIAGNOSIS — Z419 Encounter for procedure for purposes other than remedying health state, unspecified: Secondary | ICD-10-CM

## 2021-07-24 DIAGNOSIS — M4802 Spinal stenosis, cervical region: Secondary | ICD-10-CM | POA: Diagnosis not present

## 2021-07-24 DIAGNOSIS — Z794 Long term (current) use of insulin: Secondary | ICD-10-CM | POA: Insufficient documentation

## 2021-07-24 HISTORY — PX: ANTERIOR CERVICAL DECOMP/DISCECTOMY FUSION: SHX1161

## 2021-07-24 LAB — GLUCOSE, CAPILLARY
Glucose-Capillary: 88 mg/dL (ref 70–99)
Glucose-Capillary: 56 mg/dL — ABNORMAL LOW (ref 70–99)
Glucose-Capillary: 98 mg/dL (ref 70–99)
Glucose-Capillary: 121 mg/dL — ABNORMAL HIGH (ref 70–99)
Glucose-Capillary: 241 mg/dL — ABNORMAL HIGH (ref 70–99)
Glucose-Capillary: 119 mg/dL — ABNORMAL HIGH (ref 70–99)

## 2021-07-24 SURGERY — ANTERIOR CERVICAL DECOMPRESSION/DISCECTOMY FUSION 1 LEVEL
Anesthesia: General | Site: Spine Cervical

## 2021-07-24 MED ORDER — OXYCODONE HCL 5 MG PO TABS
5.0000 mg | ORAL_TABLET | Freq: Once | ORAL | Status: AC | PRN
  Administered 2021-07-24: 5 mg via ORAL

## 2021-07-24 MED ORDER — LIDOCAINE 2% (20 MG/ML) 5 ML SYRINGE
INTRAMUSCULAR | Status: DC | PRN
  Administered 2021-07-24: 60 mg via INTRAVENOUS

## 2021-07-24 MED ORDER — ORAL CARE MOUTH RINSE: 15.0000 mL | Freq: Once | OROMUCOSAL | Status: AC

## 2021-07-24 MED ORDER — MORPHINE SULFATE (PF) 2 MG/ML IV SOLN: 2.0000 mg | INTRAVENOUS | Status: DC | PRN

## 2021-07-24 MED ORDER — OXYCODONE HCL 5 MG PO TABS
5.0000 mg | ORAL_TABLET | ORAL | Status: DC | PRN
  Administered 2021-07-24 – 2021-07-25 (×4): 5 mg via ORAL
  Filled 2021-07-24 (×4): qty 1

## 2021-07-24 MED ORDER — DEXTROSE 50 % IV SOLN
25.0000 mL | Freq: Once | INTRAVENOUS | Status: AC
  Administered 2021-07-24: 25 mL via INTRAVENOUS

## 2021-07-24 MED ORDER — SODIUM CHLORIDE 0.9% FLUSH: 3.0000 mL | INTRAVENOUS | Status: DC | PRN

## 2021-07-24 MED ORDER — GABAPENTIN 300 MG PO CAPS: 300.0000 mg | ORAL_CAPSULE | ORAL | Status: AC

## 2021-07-24 MED ORDER — SENNA 8.6 MG PO TABS
1.0000 | ORAL_TABLET | Freq: Two times a day (BID) | ORAL | Status: DC
  Administered 2021-07-24: 8.6 mg via ORAL
  Filled 2021-07-24: qty 1

## 2021-07-24 MED ORDER — METHOCARBAMOL 500 MG PO TABS
ORAL_TABLET | ORAL | Status: AC
  Filled 2021-07-24: qty 1

## 2021-07-24 MED ORDER — CHLORHEXIDINE GLUCONATE 0.12 % MT SOLN: 15.0000 mL | Freq: Once | OROMUCOSAL | Status: AC

## 2021-07-24 MED ORDER — OXYCODONE HCL 5 MG/5ML PO SOLN: 5.0000 mg | Freq: Once | ORAL | Status: AC | PRN

## 2021-07-24 MED ORDER — DOXYCYCLINE HYCLATE 100 MG PO CAPS: 100.0000 mg | ORAL_CAPSULE | Freq: Two times a day (BID) | ORAL | Status: DC

## 2021-07-24 MED ORDER — FENTANYL CITRATE (PF) 100 MCG/2ML IJ SOLN
INTRAMUSCULAR | Status: AC
  Filled 2021-07-24: qty 2

## 2021-07-24 MED ORDER — OXYCODONE HCL 5 MG PO TABS
ORAL_TABLET | ORAL | Status: AC
  Filled 2021-07-24: qty 1

## 2021-07-24 MED ORDER — ONDANSETRON HCL 4 MG/2ML IJ SOLN: 4.0000 mg | Freq: Four times a day (QID) | INTRAMUSCULAR | Status: DC | PRN

## 2021-07-24 MED ORDER — MIDAZOLAM HCL 2 MG/2ML IJ SOLN
INTRAMUSCULAR | Status: DC | PRN
  Administered 2021-07-24: 2 mg via INTRAVENOUS

## 2021-07-24 MED ORDER — LACTATED RINGERS IV SOLN: INTRAVENOUS | Status: DC

## 2021-07-24 MED ORDER — MENTHOL 3 MG MT LOZG: 1.0000 | LOZENGE | OROMUCOSAL | Status: DC | PRN

## 2021-07-24 MED ORDER — ACETAMINOPHEN 500 MG PO TABS: 1000.0000 mg | ORAL_TABLET | ORAL | Status: AC

## 2021-07-24 MED ORDER — FENTANYL CITRATE (PF) 250 MCG/5ML IJ SOLN
INTRAMUSCULAR | Status: DC | PRN
  Administered 2021-07-24 (×3): 50 ug via INTRAVENOUS

## 2021-07-24 MED ORDER — BUPIVACAINE HCL (PF) 0.25 % IJ SOLN
INTRAMUSCULAR | Status: DC | PRN
  Administered 2021-07-24: 4 mL

## 2021-07-24 MED ORDER — LEVOTHYROXINE SODIUM 75 MCG PO TABS
225.0000 ug | ORAL_TABLET | Freq: Every day | ORAL | Status: DC
  Administered 2021-07-25: 225 ug via ORAL
  Filled 2021-07-24: qty 3

## 2021-07-24 MED ORDER — ACETAMINOPHEN 500 MG PO TABS
1000.0000 mg | ORAL_TABLET | Freq: Four times a day (QID) | ORAL | Status: AC
  Administered 2021-07-24 – 2021-07-25 (×4): 1000 mg via ORAL
  Filled 2021-07-24 (×4): qty 2

## 2021-07-24 MED ORDER — LEVOTHYROXINE SODIUM 100 MCG PO TABS: 200.0000 ug | ORAL_TABLET | Freq: Every day | ORAL | Status: DC

## 2021-07-24 MED ORDER — METHOCARBAMOL 1000 MG/10ML IJ SOLN
500.0000 mg | Freq: Four times a day (QID) | INTRAVENOUS | Status: DC | PRN
  Filled 2021-07-24: qty 5

## 2021-07-24 MED ORDER — INSULIN ASPART 100 UNIT/ML IJ SOLN
0.0000 [IU] | Freq: Three times a day (TID) | INTRAMUSCULAR | Status: DC
  Administered 2021-07-24: 2 [IU] via SUBCUTANEOUS
  Administered 2021-07-24: 5 [IU] via SUBCUTANEOUS

## 2021-07-24 MED ORDER — GABAPENTIN 300 MG PO CAPS
600.0000 mg | ORAL_CAPSULE | Freq: Four times a day (QID) | ORAL | Status: DC | PRN
  Administered 2021-07-24: 600 mg via ORAL
  Filled 2021-07-24: qty 2

## 2021-07-24 MED ORDER — LAMOTRIGINE 100 MG PO TABS
200.0000 mg | ORAL_TABLET | Freq: Two times a day (BID) | ORAL | Status: DC
  Administered 2021-07-24: 200 mg via ORAL
  Filled 2021-07-24 (×3): qty 2

## 2021-07-24 MED ORDER — CHLORHEXIDINE GLUCONATE CLOTH 2 % EX PADS: 6.0000 | MEDICATED_PAD | Freq: Once | CUTANEOUS | Status: DC

## 2021-07-24 MED ORDER — ROCURONIUM BROMIDE 10 MG/ML (PF) SYRINGE
PREFILLED_SYRINGE | INTRAVENOUS | Status: DC | PRN
  Administered 2021-07-24: 60 mg via INTRAVENOUS

## 2021-07-24 MED ORDER — VENLAFAXINE HCL ER 37.5 MG PO CP24
37.5000 mg | ORAL_CAPSULE | Freq: Every day | ORAL | Status: DC
  Filled 2021-07-24: qty 1

## 2021-07-24 MED ORDER — MONTELUKAST SODIUM 10 MG PO TABS
10.0000 mg | ORAL_TABLET | Freq: Every day | ORAL | Status: DC
  Administered 2021-07-24: 10 mg via ORAL
  Filled 2021-07-24: qty 1

## 2021-07-24 MED ORDER — LEVOTHYROXINE SODIUM 25 MCG PO TABS: 25.0000 ug | ORAL_TABLET | Freq: Every day | ORAL | Status: DC

## 2021-07-24 MED ORDER — PROPOFOL 10 MG/ML IV BOLUS
INTRAVENOUS | Status: AC
  Filled 2021-07-24: qty 20

## 2021-07-24 MED ORDER — CHLORHEXIDINE GLUCONATE 0.12 % MT SOLN
OROMUCOSAL | Status: AC
  Administered 2021-07-24: 15 mL
  Filled 2021-07-24: qty 15

## 2021-07-24 MED ORDER — FUROSEMIDE 20 MG PO TABS
20.0000 mg | ORAL_TABLET | Freq: Every day | ORAL | Status: DC
  Administered 2021-07-24: 20 mg via ORAL
  Filled 2021-07-24: qty 1

## 2021-07-24 MED ORDER — BUSPIRONE HCL 15 MG PO TABS: 15.0000 mg | ORAL_TABLET | ORAL | Status: DC

## 2021-07-24 MED ORDER — FENTANYL CITRATE (PF) 250 MCG/5ML IJ SOLN
INTRAMUSCULAR | Status: AC
  Filled 2021-07-24: qty 5

## 2021-07-24 MED ORDER — POTASSIUM CHLORIDE IN NACL 20-0.9 MEQ/L-% IV SOLN: INTRAVENOUS | Status: DC

## 2021-07-24 MED ORDER — PHENOL 1.4 % MT LIQD: 1.0000 | OROMUCOSAL | Status: DC | PRN

## 2021-07-24 MED ORDER — ACETAMINOPHEN 500 MG PO TABS
ORAL_TABLET | ORAL | Status: AC
  Administered 2021-07-24: 1000 mg via ORAL
  Filled 2021-07-24: qty 2

## 2021-07-24 MED ORDER — TOPIRAMATE 25 MG PO TABS
50.0000 mg | ORAL_TABLET | Freq: Two times a day (BID) | ORAL | Status: DC
  Administered 2021-07-24 (×2): 50 mg via ORAL
  Filled 2021-07-24 (×2): qty 2

## 2021-07-24 MED ORDER — METHOCARBAMOL 500 MG PO TABS
500.0000 mg | ORAL_TABLET | Freq: Four times a day (QID) | ORAL | Status: DC | PRN
  Administered 2021-07-24 – 2021-07-25 (×3): 500 mg via ORAL
  Filled 2021-07-24 (×2): qty 1

## 2021-07-24 MED ORDER — THROMBIN 5000 UNITS EX SOLR
CUTANEOUS | Status: AC
  Filled 2021-07-24: qty 5000

## 2021-07-24 MED ORDER — GEMFIBROZIL 600 MG PO TABS
600.0000 mg | ORAL_TABLET | Freq: Two times a day (BID) | ORAL | Status: DC
  Administered 2021-07-24 – 2021-07-25 (×2): 600 mg via ORAL
  Filled 2021-07-24 (×2): qty 1

## 2021-07-24 MED ORDER — THROMBIN 5000 UNITS EX SOLR: OROMUCOSAL | Status: DC | PRN

## 2021-07-24 MED ORDER — DEXAMETHASONE SODIUM PHOSPHATE 10 MG/ML IJ SOLN
10.0000 mg | Freq: Once | INTRAMUSCULAR | Status: AC
  Administered 2021-07-24: 10 mg via INTRAVENOUS

## 2021-07-24 MED ORDER — LISINOPRIL 20 MG PO TABS
20.0000 mg | ORAL_TABLET | Freq: Every day | ORAL | Status: DC
  Administered 2021-07-24: 20 mg via ORAL
  Filled 2021-07-24: qty 1

## 2021-07-24 MED ORDER — SODIUM CHLORIDE 0.9 % IV SOLN
250.0000 mL | INTRAVENOUS | Status: DC
  Administered 2021-07-24: 250 mL via INTRAVENOUS

## 2021-07-24 MED ORDER — METOPROLOL SUCCINATE ER 25 MG PO TB24
25.0000 mg | ORAL_TABLET | Freq: Every evening | ORAL | Status: DC
  Administered 2021-07-24: 25 mg via ORAL
  Filled 2021-07-24: qty 1

## 2021-07-24 MED ORDER — PROPOFOL 10 MG/ML IV BOLUS
INTRAVENOUS | Status: DC | PRN
  Administered 2021-07-24: 150 mg via INTRAVENOUS

## 2021-07-24 MED ORDER — FENTANYL CITRATE (PF) 100 MCG/2ML IJ SOLN
25.0000 ug | INTRAMUSCULAR | Status: DC | PRN
  Administered 2021-07-24 (×2): 50 ug via INTRAVENOUS

## 2021-07-24 MED ORDER — VANCOMYCIN HCL IN DEXTROSE 1-5 GM/200ML-% IV SOLN
1000.0000 mg | Freq: Once | INTRAVENOUS | Status: AC
  Administered 2021-07-24: 1000 mg via INTRAVENOUS
  Filled 2021-07-24: qty 200

## 2021-07-24 MED ORDER — PROMETHAZINE HCL 25 MG/ML IJ SOLN: 6.2500 mg | INTRAMUSCULAR | Status: DC | PRN

## 2021-07-24 MED ORDER — PHENYLEPHRINE HCL-NACL 10-0.9 MG/250ML-% IV SOLN
INTRAVENOUS | Status: DC | PRN
  Administered 2021-07-24: 50 ug/min via INTRAVENOUS

## 2021-07-24 MED ORDER — ONDANSETRON HCL 4 MG PO TABS
4.0000 mg | ORAL_TABLET | Freq: Four times a day (QID) | ORAL | Status: DC | PRN
Start: 2021-07-24 — End: 2021-07-25

## 2021-07-24 MED ORDER — GABAPENTIN 300 MG PO CAPS
ORAL_CAPSULE | ORAL | Status: AC
  Administered 2021-07-24: 300 mg via ORAL
  Filled 2021-07-24: qty 1

## 2021-07-24 MED ORDER — MIDAZOLAM HCL 2 MG/2ML IJ SOLN
INTRAMUSCULAR | Status: AC
  Filled 2021-07-24: qty 2

## 2021-07-24 MED ORDER — TRIAMTERENE-HCTZ 37.5-25 MG PO TABS
1.0000 | ORAL_TABLET | Freq: Every day | ORAL | Status: DC
  Administered 2021-07-24: 1 via ORAL
  Filled 2021-07-24 (×2): qty 1

## 2021-07-24 MED ORDER — ALBUTEROL SULFATE (2.5 MG/3ML) 0.083% IN NEBU: 3.0000 mL | INHALATION_SOLUTION | Freq: Four times a day (QID) | RESPIRATORY_TRACT | Status: DC | PRN

## 2021-07-24 MED ORDER — DEXTROSE 50 % IV SOLN
INTRAVENOUS | Status: AC
  Filled 2021-07-24: qty 50

## 2021-07-24 MED ORDER — BUPIVACAINE HCL (PF) 0.25 % IJ SOLN
INTRAMUSCULAR | Status: AC
  Filled 2021-07-24: qty 30

## 2021-07-24 MED ORDER — SODIUM CHLORIDE 0.9% FLUSH
3.0000 mL | Freq: Two times a day (BID) | INTRAVENOUS | Status: DC
  Administered 2021-07-24 (×2): 3 mL via INTRAVENOUS

## 2021-07-24 MED ORDER — BUSPIRONE HCL 15 MG PO TABS
30.0000 mg | ORAL_TABLET | Freq: Every day | ORAL | Status: DC
  Administered 2021-07-24: 30 mg via ORAL
  Filled 2021-07-24: qty 2

## 2021-07-24 MED ORDER — 0.9 % SODIUM CHLORIDE (POUR BTL) OPTIME
TOPICAL | Status: DC | PRN
  Administered 2021-07-24: 1000 mL

## 2021-07-24 MED ORDER — BUSPIRONE HCL 15 MG PO TABS
15.0000 mg | ORAL_TABLET | Freq: Every morning | ORAL | Status: DC
  Filled 2021-07-24 (×2): qty 1

## 2021-07-24 MED ORDER — GLIPIZIDE 5 MG PO TABS
10.0000 mg | ORAL_TABLET | Freq: Two times a day (BID) | ORAL | Status: DC
  Administered 2021-07-24 – 2021-07-25 (×2): 10 mg via ORAL
  Filled 2021-07-24 (×2): qty 2

## 2021-07-24 MED ORDER — PHENYTOIN SODIUM EXTENDED 100 MG PO CAPS
100.0000 mg | ORAL_CAPSULE | Freq: Three times a day (TID) | ORAL | Status: DC
  Administered 2021-07-24 (×2): 100 mg via ORAL
  Filled 2021-07-24 (×3): qty 1

## 2021-07-24 MED ORDER — VENLAFAXINE HCL ER 75 MG PO CP24
150.0000 mg | ORAL_CAPSULE | Freq: Every day | ORAL | Status: DC
  Administered 2021-07-24: 150 mg via ORAL
  Filled 2021-07-24: qty 2

## 2021-07-24 MED ORDER — TRAZODONE HCL 50 MG PO TABS: 100.0000 mg | ORAL_TABLET | Freq: Every evening | ORAL | Status: DC | PRN

## 2021-07-24 SURGICAL SUPPLY — 45 items
BAG COUNTER SPONGE SURGICOUNT (BAG) ×2 IMPLANT
BAND RUBBER #18 3X1/16 STRL (MISCELLANEOUS) ×4 IMPLANT
BASKET BONE COLLECTION (BASKET) ×2 IMPLANT
BUR CARBIDE MATCH 3.0 (BURR) ×2 IMPLANT
CAGE CERVICAL 8 (Cage) ×1 IMPLANT
CANISTER SUCT 3000ML PPV (MISCELLANEOUS) ×2 IMPLANT
DRAPE C-ARM 42X72 X-RAY (DRAPES) ×4 IMPLANT
DRAPE LAPAROTOMY 100X72 PEDS (DRAPES) ×2 IMPLANT
DRAPE MICROSCOPE LEICA (MISCELLANEOUS) ×2 IMPLANT
DRSG OPSITE POSTOP 3X4 (GAUZE/BANDAGES/DRESSINGS) ×2 IMPLANT
DURAPREP 6ML APPLICATOR 50/CS (WOUND CARE) ×2 IMPLANT
ELECT COATED BLADE 2.86 ST (ELECTRODE) ×2 IMPLANT
ELECT REM PT RETURN 9FT ADLT (ELECTROSURGICAL) ×2
ELECTRODE REM PT RTRN 9FT ADLT (ELECTROSURGICAL) ×1 IMPLANT
GAUZE 4X4 16PLY ~~LOC~~+RFID DBL (SPONGE) ×2 IMPLANT
GLOVE SURG ENC MOIS LTX SZ7 (GLOVE) ×4 IMPLANT
GLOVE SURG ENC MOIS LTX SZ8 (GLOVE) ×4 IMPLANT
GLOVE SURG UNDER POLY LF SZ7 (GLOVE) ×4 IMPLANT
GOWN STRL REUS W/ TWL LRG LVL3 (GOWN DISPOSABLE) ×1 IMPLANT
GOWN STRL REUS W/ TWL XL LVL3 (GOWN DISPOSABLE) ×2 IMPLANT
GOWN STRL REUS W/TWL 2XL LVL3 (GOWN DISPOSABLE) IMPLANT
GOWN STRL REUS W/TWL LRG LVL3 (GOWN DISPOSABLE) ×1
GOWN STRL REUS W/TWL XL LVL3 (GOWN DISPOSABLE) ×2
HEMOSTAT POWDER KIT SURGIFOAM (HEMOSTASIS) ×2 IMPLANT
KIT BASIN OR (CUSTOM PROCEDURE TRAY) ×2 IMPLANT
KIT TURNOVER KIT B (KITS) ×2 IMPLANT
NEEDLE HYPO 25X1 1.5 SAFETY (NEEDLE) ×2 IMPLANT
NEEDLE SPNL 20GX3.5 QUINCKE YW (NEEDLE) ×2 IMPLANT
NS IRRIG 1000ML POUR BTL (IV SOLUTION) ×2 IMPLANT
PACK LAMINECTOMY NEURO (CUSTOM PROCEDURE TRAY) ×2 IMPLANT
PIN DISTRACTION 14MM (PIN) ×4 IMPLANT
PLATE ACP INSIGNIA 22 1L (Plate) ×2 IMPLANT
PUTTY BONE 1CC (Putty) ×2 IMPLANT
SCREW VA SINGLE LEAD 4X14 (Screw) ×4 IMPLANT
SCREW VA SINGLE LEAD 4X14 ST (Screw) ×4 IMPLANT
SPACER SPNL 7D LRG 16X14X8XNS (Cage) ×1 IMPLANT
SPCR SPNL 7D LRG 16X14X8XNS (Cage) ×1 IMPLANT
SPONGE INTESTINAL PEANUT (DISPOSABLE) ×2 IMPLANT
SPONGE SURGIFOAM ABS GEL SZ50 (HEMOSTASIS) IMPLANT
SPONGE T-LAP 4X18 ~~LOC~~+RFID (SPONGE) ×2 IMPLANT
STRIP CLOSURE SKIN 1/2X4 (GAUZE/BANDAGES/DRESSINGS) ×2 IMPLANT
SUT VIC AB 3-0 SH 8-18 (SUTURE) ×4 IMPLANT
TOWEL GREEN STERILE (TOWEL DISPOSABLE) ×2 IMPLANT
TOWEL GREEN STERILE FF (TOWEL DISPOSABLE) ×2 IMPLANT
WATER STERILE IRR 1000ML POUR (IV SOLUTION) ×2 IMPLANT

## 2021-07-24 NOTE — Op Note (Signed)
07/24/2021  9:17 AM  PATIENT:  Jenna Butler  70 y.o. 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 Decompressive anterior cervical discectomy C5-6, 2. Anterior cervical arthrodesis C5-6 utilizing a peek interbody cage packed with locally harvested morcellized autologous bone graft and 1 cc of DBM, 3. Anterior cervical plating C5-6 utilizing a Alphatec plate  SURGEON:  Marikay Alar, MD  ASSISTANTS: Verlin Dike FNP  ANESTHESIA:   General  EBL: Less than 25 ml  No intake/output data recorded.  BLOOD ADMINISTERED: none  DRAINS: none  SPECIMEN:  none  INDICATION FOR PROCEDURE: This patient presented with neck and arm pain with multiple falls. Imaging showed spinal stenosis C5-6 with cord compression. The patient tried conservative measures without relief. Pain was debilitating. Recommended ACDF with plating. Patient understood the risks, benefits, and alternatives and potential outcomes and wished to proceed.  PROCEDURE DETAILS: Patient was brought to the operating room placed under general endotracheal anesthesia. Patient was placed in the supine position on the operating room table. The neck was prepped with Duraprep and draped in a sterile fashion.   Three cc of local anesthesia was injected and a transverse incision was made on the right side of the neck.  Dissection was carried down thru the subcutaneous tissue and the platysma was  elevated, opened, and undermined with Metzenbaum scissors.  Dissection was then carried out thru an avascular plane leaving the sternocleidomastoid carotid artery and jugular vein laterally and the trachea and esophagus medially with the assistance of my nurse practitioner. The ventral aspect of the vertebral column was identified and a localizing x-ray was taken. The C5-6 level was identified and all in the room agreed with the level. The longus  colli muscles were then elevated and the retractor was placed with the assistance of my nurse practitioner. The annulus was incised and the disc space entered. Discectomy was performed with micro-curettes and pituitary rongeurs. I then used the high-speed drill to drill the endplates down to the level of the posterior longitudinal ligament. The drill shavings were saved in a mucous trap for later arthrodesis. The operating microscope was draped and brought into the field provided additional magnification, illumination and visualization. Discectomy was continued posteriorly thru the disc space. Posterior longitudinal ligament was opened with a nerve hook, and then removed along with disc herniation and osteophytes, decompressing the spinal canal and thecal sac. We then continued to remove osteophytic overgrowth and disc material decompressing the neural foramina and exiting nerve roots bilaterally. The scope was angled up and down to help decompress and undercut the vertebral bodies. Once the decompression was completed we could pass a nerve hook circumferentially to assure adequate decompression in the midline and in the neural foramina. So by both visualization and palpation we felt we had an adequate decompression of the neural elements. We then measured the height of the intravertebral disc space and selected a 8 millimeter peek interbody cage packed with autograft and 1 cc of DBM. It was then gently positioned in the intravertebral disc space(s) and countersunk. I then used a 22 mm Alphatec plate and placed 14 mm variable angle screws into the vertebral bodies of each level and locked them into position. The wound was irrigated with bacitracin solution, checked for hemostasis which was established and confirmed. Once meticulous hemostasis was achieved, we then proceeded with closure with the assistance of my nurse practitioner. The platysma was closed with  interrupted 3-0 undyed Vicryl suture, the subcuticular  layer was closed with interrupted 3-0 undyed Vicryl suture. The skin edges were approximated with steristrips. The drapes were removed. A sterile dressing was applied. The patient was then awakened from general anesthesia and transferred to the recovery room in stable condition. At the end of the procedure all sponge, needle and instrument counts were correct.   PLAN OF CARE: Admit for overnight observation  PATIENT DISPOSITION:  PACU - hemodynamically stable.   Delay start of Pharmacological VTE agent (>24hrs) due to surgical blood loss or risk of bleeding:  yes

## 2021-07-24 NOTE — Progress Notes (Signed)
Hypoglycemic Event  CBG: 56  Treatment: D50 25 mL (12.5 gm)  Symptoms: None  Follow-up CBG: Time:0952 CBG Result:98  Possible Reasons for Event: Other: NPO due to surgery.  Comments/MD notified:Verbal order for 25 mL of Dextrose 50% IV for initial phone call. MD was updated on blood sugar at 0952.     Marca Ancona RN

## 2021-07-24 NOTE — Anesthesia Postprocedure Evaluation (Signed)
Anesthesia Post Note  Patient: Jenna Butler  Procedure(s) Performed: CERVICAL FIVE-SIX ANTERIOR CERVICAL DECOMPRESSION/DISCECTOMY FUSION (Spine Cervical)     Patient location during evaluation: PACU Anesthesia Type: General Level of consciousness: awake and alert Pain management: pain level controlled Vital Signs Assessment: post-procedure vital signs reviewed and stable Respiratory status: spontaneous breathing, nonlabored ventilation, respiratory function stable and patient connected to nasal cannula oxygen Cardiovascular status: blood pressure returned to baseline and stable Postop Assessment: no apparent nausea or vomiting Anesthetic complications: no   No notable events documented.  Last Vitals:  Vitals:   07/24/21 1030 07/24/21 1102  BP: 117/72 139/82  Pulse: 87 85  Resp: 12 18  Temp:  36.5 C  SpO2: 100% 100%    Last Pain:  Vitals:   07/24/21 1102  TempSrc: Oral  PainSc:                  Nelle Don Arnecia Ector

## 2021-07-24 NOTE — H&P (Signed)
Subjective:   Patient is a 70 y.o. female admitted for cervical stenosis. The patient first presented to me with complaints of neck pain. Onset of symptoms was several months ago. The pain is described as aching and occurs all day. The pain is rated moderate, and is located in the neck and radiates to the shoulders. The symptoms have been progressive. Symptoms are exacerbated by extending head backwards, and are relieved by none.  Previous work up includes MRI of cervical spine, results: spinal stenosis.  Past Medical History:  Diagnosis Date   Anemia    Anxiety    Arthritis    Asthma    COPD (chronic obstructive pulmonary disease) (HCC)    Coronary artery disease    Depression    Diabetes mellitus without complication (HCC)    Dyspnea    Dysrhythmia    Family history of adverse reaction to anesthesia    mother woke up during surgery   Fibromyalgia    Headache    tension headaches and migraines   Hypertension    Hypothyroidism    Multiple lung nodules on CT    CT chest 09/27/20 ([facility]): Stable appearance of many small pulmonary nodules, followed by pulmonology   Neuropathy    both hands, arms, feet and legs   Restless legs    Seizures (HCC)    small, focal type seizures, on Dilantin and Lamictal   Sleep apnea    uses CPAP, haven't used CPAP in a while, getting a new one    Past Surgical History:  Procedure Laterality Date   ABDOMINAL HYSTERECTOMY  2000   APPENDECTOMY  1972   BACK SURGERY     CARDIAC CATHETERIZATION  2005   1 stent [facility]   CHOLECYSTECTOMY  1974   CORONARY ANGIOPLASTY     EYE SURGERY     HAND SURGERY     Plastic surgery   LUMBAR LAMINECTOMY/DECOMPRESSION MICRODISCECTOMY Left 11/03/2014   Procedure: Left Lumbar three/four Intra/Extraformainal Microdiskectomy ;  Surgeon: Tia Alert, MD;  Location: [facility];  Service: Neurosurgery;  Laterality: Left;   SHOULDER SURGERY     TONSILLECTOMY     TRANSFORAMINAL LUMBAR INTERBODY FUSION  (TLIF) WITH PEDICLE SCREW FIXATION 3 LEVEL N/A 11/27/2016   Procedure: Lumbar three-four, Lumbar four-five, Lumbar five-Sacral one Posterior Lumbar Interbody Fusion;  Surgeon: Tia Alert, MD;  Location: [facility] OR;  Service: Neurosurgery;  Laterality: N/A;   TUBAL LIGATION      Allergies  Allergen Reactions   Amoxicillin Shortness Of Breath and Rash   Ceclor [Cefaclor] Shortness Of Breath, Itching, Swelling and Rash   Lyrica [Pregabalin] Other (See Comments)    "causes me to be very depressed."   Tape Dermatitis    "tears my skin."    Social History   Tobacco Use   Smoking status: Former    Packs/day: 2.00    Years: 30.00    Pack years: 60.00    Types: Cigarettes    Quit date: 10/30/1997    Years since quitting: 23.7   Smokeless tobacco: Never  Substance Use Topics   Alcohol use: No    Family History  Problem Relation Age of Onset   Heart attack Mother    Hypertension Mother    Alzheimer's disease Mother    Prior to Admission medications   Medication Sig Start Date End Date Taking? Authorizing Provider  albuterol (PROVENTIL HFA;VENTOLIN HFA) 108 (90 BASE) MCG/ACT inhaler Inhale 2 puffs into the lungs every 6 (six) hours  as needed for wheezing or shortness of breath.   Yes [provider]  aspirin EC 81 MG tablet Take 81 mg by mouth daily. Patient not taking: No sig reported    [provider]  busPIRone (BUSPAR) 15 MG tablet Take 15-30 mg by mouth See admin instructions. Take 15 mg by mouth in the morning and 30 mg in the evening.    [provider]  CALCIUM PO Take 1 tablet by mouth daily.    [provider]  cetirizine (ZYRTEC) 10 MG tablet Take 10 mg by mouth daily.    [provider]  clonazePAM (KLONOPIN) 0.5 MG tablet Take 0.5 mg by mouth daily as needed for anxiety.    [provider]  doxycycline (VIBRAMYCIN) 100 MG capsule Take 1 capsule (100 mg total) by mouth 2 (two) times daily. 07/16/21   Eustace Moore, MD   furosemide (LASIX) 20 MG tablet Take 1 tablet (20 mg total) by mouth daily. Has not had Rx recently--would like a refill 07/16/21   Eustace Moore, MD  gabapentin (NEURONTIN) 300 MG capsule Take 600 mg by mouth 4 (four) times daily as needed (pain).    [provider]  Garlic 1200 MG CAPS Take 1,200 mg by mouth daily.    [provider]  gemfibrozil (LOPID) 600 MG tablet Take 600 mg by mouth 2 (two) times daily before a meal.    [provider]  glipiZIDE (GLUCOTROL) 10 MG tablet Take 10 mg by mouth 2 (two) times daily before a meal.    [provider]  glucose 4 GM chewable tablet Chew 1 tablet by mouth as needed for low blood sugar.    [provider]  insulin glargine (LANTUS) 100 UNIT/ML injection Inject 8 Units into the skin at bedtime.    [provider]  lamoTRIgine (LAMICTAL) 200 MG tablet Take 200 mg by mouth 2 (two) times daily.    [provider]  levothyroxine (SYNTHROID) 25 MCG tablet Take 25 mcg by mouth daily. 05/15/21   [provider]  levothyroxine (SYNTHROID, LEVOTHROID) 200 MCG tablet Take 200 mcg by mouth daily before breakfast.    [provider]  lisinopril (PRINIVIL,ZESTRIL) 20 MG tablet Take 20 mg by mouth daily.    [provider]  meloxicam (MOBIC) 7.5 MG tablet Take 7.5 mg by mouth daily. Patient not taking: No sig reported    [provider]  metoprolol succinate (TOPROL-XL) 25 MG 24 hr tablet Take 25 mg by mouth every evening. 08/26/16   [provider]  montelukast (SINGULAIR) 10 MG tablet Take 10 mg by mouth daily.    [provider]  phenytoin (DILANTIN) 100 MG ER capsule Take 100 mg by mouth 3 (three) times daily.    [provider]  topiramate (TOPAMAX) 50 MG tablet Take 50 mg by mouth 2 (two) times daily.    [provider]  traZODone (DESYREL) 100 MG tablet Take 100 mg by mouth at bedtime as needed for sleep.    [provider]  triamterene-hydrochlorothiazide (MAXZIDE-25) 37.5-25 MG tablet Take 1 tablet by mouth daily.    [provider]  venlafaxine XR (EFFEXOR-XR) 150 MG 24 hr capsule Take 150 mg by mouth daily after lunch. 09/15/16   [provider]  venlafaxine XR (EFFEXOR-XR) 37.5 MG 24 hr capsule Take 37.5 mg by mouth daily with breakfast. 09/15/16   [provider]     Review of Systems  Positive ROS: neg  All other systems have been reviewed and were otherwise negative with the exception of those mentioned in the HPI and as above.  Objective: Vital signs in last 24 hours: Temp:  [98 F (36.7 C)] 98 F (36.7 C) (07/27 0549) Pulse Rate:  [80] 80 (07/27 0549) Resp:  [18] 18 (07/27 0549) BP: (144)/(64) 144/64 (07/27 0549) SpO2:  [99 %] 99 % (07/27 0549)  General Appearance: Alert, cooperative, no distress, appears stated age Head: Normocephalic, without obvious abnormality, atraumatic Eyes: PERRL, conjunctiva/corneas clear, EOM's intact      Neck: Supple, symmetrical, trachea midline, Back: Symmetric, no curvature, ROM normal, no CVA tenderness Lungs:  respirations unlabored Heart: Regular rate and rhythm Abdomen: Soft, non-tender Extremities: Extremities normal, atraumatic, no cyanosis or edema Pulses: 2+ and symmetric all extremities Skin: Skin color, texture, turgor normal, no rashes or lesions  NEUROLOGIC:  Mental status: Alert and oriented x4, no aphasia, good attention span, fund of knowledge and memory  Motor Exam - grossly normal Sensory Exam - grossly normal Reflexes: 2+ Coordination - grossly normal Gait - not tested Balance - grossly normal Cranial Nerves: I: smell Not tested  II: visual acuity  OS: nl    OD: nl  II: visual fields Full to confrontation  II: pupils Equal, round, reactive to light  III,VII: ptosis None  III,IV,VI: extraocular muscles  Full ROM  V: mastication Normal  V: facial light touch sensation  Normal  V,VII:  corneal reflex  Present  VII: facial muscle function - upper  Normal  VII: facial muscle function - lower Normal  VIII: hearing Not tested  IX: soft palate elevation  Normal  IX,X: gag reflex Present  XI: trapezius strength  5/5  XI: sternocleidomastoid strength 5/5  XI: neck flexion strength  5/5  XII: tongue strength  Normal    Data Review Lab Results  Component Value Date   WBC 5.9 07/22/2021   HGB 13.0 07/22/2021   HCT 40.0 07/22/2021   MCV 91.1 07/22/2021   PLT 222 07/22/2021   Lab Results  Component Value Date   NA 140 07/22/2021   K 3.4 (L) 07/22/2021   CL 103 07/22/2021   CO2 28 07/22/2021   BUN 47 (H) 07/22/2021   CREATININE 1.12 (H) 07/22/2021   GLUCOSE 106 (H) 07/22/2021   Lab Results  Component Value Date   INR 1.1 07/22/2021    Assessment:   Cervical neck pain with herniated nucleus pulposus/ spondylosis/ stenosis at C5-6. Estimated body mass index is 39.94 kg/m as calculated from the following:   Height as of 07/22/21: 5\' 4"  (1.626 m).   Weight as of 07/22/21: 105.6 kg.  Patient has failed conservative therapy. Planned surgery : ACDF C5-6  Plan:   I explained the condition and procedure to the patient and answered any questions.  Patient wishes to proceed with procedure as planned. Understands risks/ benefits/ and expected or typical outcomes.  07/24/21 07/24/2021 7:33 AM

## 2021-07-24 NOTE — Progress Notes (Signed)
Looks good post-op, eating a sandwich, no neuro issues, mild pain, dressing flat and dry

## 2021-07-24 NOTE — Transfer of Care (Signed)
Immediate Anesthesia Transfer of Care Note  Patient: Jenna Butler  Procedure(s) Performed: CERVICAL FIVE-SIX ANTERIOR CERVICAL DECOMPRESSION/DISCECTOMY FUSION (Spine Cervical)  Patient Location: PACU  Anesthesia Type:General  Level of Consciousness: awake, alert  and oriented  Airway & Oxygen Therapy: Patient Spontanous Breathing and Patient connected to nasal cannula oxygen  Post-op Assessment: Report given to RN and Post -op Vital signs reviewed and stable  Post vital signs: Reviewed and stable  Last Vitals:  Vitals Value Taken Time  BP 134/63 07/24/21 0929  Temp    Pulse 87 07/24/21 0933  Resp 24 07/24/21 0933  SpO2 100 % 07/24/21 0933  Vitals shown include unvalidated device data.  Last Pain:  Vitals:   07/24/21 0619  TempSrc:   PainSc: 8          Complications: No notable events documented.

## 2021-07-24 NOTE — Anesthesia Procedure Notes (Signed)

## 2021-07-25 DIAGNOSIS — M4802 Spinal stenosis, cervical region: Secondary | ICD-10-CM | POA: Diagnosis not present

## 2021-07-25 LAB — HEMOGLOBIN A1C
Hgb A1c MFr Bld: 6.2 % — ABNORMAL HIGH (ref 4.8–5.6)
Mean Plasma Glucose: 131 mg/dL

## 2021-07-25 LAB — GLUCOSE, CAPILLARY: Glucose-Capillary: 105 mg/dL — ABNORMAL HIGH (ref 70–99)

## 2021-07-25 MED ORDER — OXYCODONE HCL 5 MG PO TABS: 5.0000 mg | ORAL_TABLET | Freq: Four times a day (QID) | ORAL | 0 refills | Status: AC | PRN

## 2021-07-25 NOTE — TOC Transition Note (Signed)
Transition of Care [facility]) - CM/SW Discharge Note   Patient Details  Name: Jenna Butler MRN: [id number] Date of Birth: 09-12-1951  [facility] CM/SW Contact:  Kermit Balo, RN Phone Number: 07/25/2021, 11:46 AM   Clinical Narrative:    CM went to see pt and she had been d/ced. CM spoke to the bedside RN and she states she patient refused HH services at this time. Pt to have additional surgery and want to wait until after that surgery.  Pt had transport home.   Final next level of care: Home/Self Care Barriers to Discharge: No Barriers Identified   Patient Goals and CMS Choice        Discharge Placement                       Discharge Plan and Services                                     Social Determinants of Health (SDOH) Interventions     Readmission Risk Interventions No flowsheet data found.

## 2021-07-25 NOTE — Plan of Care (Signed)
Pt. discharged home accompanied by husband. Prescriptions and discharge instructions given with verbalization of understanding. Incision site on back with no s/s of infection - no swelling, redness, bleeding, and/or drainage noted. Opportunity given to ask questions but no question asked. Pt. transported out of this unit in wheelchair by the volunteer   

## 2021-07-25 NOTE — Evaluation (Signed)
Occupational Therapy Evaluation Patient Details Name: Jenna Butler MRN: [id number] DOB: [birth year] Today's Date: 07/25/2021    History of Present Illness Patient is a 70 year old female with cervical stenosis C5-6. Pt s/p ACDF with plating 7/27. PMH includes shoulder surgery, multiple back surgeries, cardiac cath, DM   Clinical Impression   Patient lives with spouse in a single level house with ramp entry. Spouse assists patient in/out of tub shower and stays nearby while patient bathes, otherwise she is modified independent with basic self care tasks, uses rollator for ambulation. Patient reports multiple falls in the past, including a fall 3 days ago onto toilet in the bathroom "I just wasn't fully awake yet." Educate patient on how to maintain cervical precautions, activity modifications and fall prevention strategies, verbalize understanding. Patient already dressed, used bathroom and completed grooming/hygiene this AM, able to demonstrate mod I with ambulation in room with walker. No further acute OT needs at this time, will sign off. Please re-consult if new needs arise.     Follow Up Recommendations  No OT follow up    Equipment Recommendations  None recommended by OT       Precautions / Restrictions Precautions Precautions: Cervical Precaution Booklet Issued: Yes (comment) Restrictions Weight Bearing Restrictions: No      Mobility Bed Mobility               General bed mobility comments: seated edge of bed upon arrival    Transfers Overall transfer level: Modified independent Equipment used: Rolling walker (2 wheeled)                  Balance Overall balance assessment: Modified Independent                                         ADL either performed or assessed with clinical judgement   ADL Overall ADL's : At baseline;Modified independent                                       General ADL Comments: upon arrival  patient already dressed, had brushed her teeth and used bathroom. educate patient on how to maintain cervical precautions and hand out. also discussed fall prevention strategies as patient states she has had many falls in the past                  Pertinent Vitals/Pain Pain Assessment: Faces Faces Pain Scale: Hurts a little bit Pain Location: neck Pain Descriptors / Indicators: Sore Pain Intervention(s): Monitored during session     Hand Dominance  (did not specify)   Extremity/Trunk Assessment Upper Extremity Assessment Upper Extremity Assessment: Overall WFL for tasks assessed   Lower Extremity Assessment Lower Extremity Assessment: Defer to PT evaluation       Communication Communication Communication: No difficulties   Cognition Arousal/Alertness: Awake/alert Behavior During Therapy: WFL for tasks assessed/performed Overall Cognitive Status: Within Functional Limits for tasks assessed                                                Home Living Family/patient expects to be discharged to:: Private residence Living Arrangements: Spouse/significant other Available Help at Discharge:  Family;Available PRN/intermittently Type of Home: House Home Access: Ramped entrance     Home Layout: One level     Bathroom Shower/Tub: Chief Strategy Officer: Standard     Home Equipment: Environmental consultant - 4 wheels;Cane - single point;Shower seat;Grab bars - tub/shower;Toilet riser (does not use toilet riser or shower chair-doesn't like them)          Prior Functioning/Environment Level of Independence: Independent with assistive device(s)        Comments: reports fall 3 days ago onto toilet and bruising her back. primarily uses rollator for ambulation. spouse does assist patient in/out of shower and stays nearby if she needs assist        OT Problem List: Pain;Decreased knowledge of precautions         OT Goals(Current goals can be found in the  care plan section) Acute Rehab OT Goals Patient Stated Goal: home today OT Goal Formulation: All assessment and education complete, DC therapy   AM-PAC OT "6 Clicks" Daily Activity     Outcome Measure Help from another person eating meals?: None Help from another person taking care of personal grooming?: None Help from another person toileting, which includes using toliet, bedpan, or urinal?: None Help from another person bathing (including washing, rinsing, drying)?: None Help from another person to put on and taking off regular upper body clothing?: None Help from another person to put on and taking off regular lower body clothing?: None 6 Click Score: 24   End of Session Equipment Utilized During Treatment: Rolling walker Nurse Communication: Other (comment) (OT complete)  Activity Tolerance: Patient tolerated treatment well Patient left: with call bell/phone within reach;Other (comment) (seated edge of bed)  OT Visit Diagnosis: History of falling (Z91.81);Pain Pain - part of body:  (neck)                Time: 6761-9509 OT Time Calculation (min): 22 min Charges:  OT General Charges $OT Visit: 1 Visit OT Evaluation $OT Eval Low Complexity: 1 Low  Jenna Butler OT OT pager: [phone]  Jenna Butler 07/25/2021, 7:06 AM

## 2021-07-25 NOTE — Evaluation (Signed)
Physical Therapy Evaluation Patient Details Name: Jenna Butler MRN: [id number] DOB: 10/08/1951 Today's Date: 07/25/2021   History of Present Illness  Patient is a 70 year old female with cervical stenosis C5-6. Pt s/p ACDF with plating 7/27. PMH includes shoulder surgery, multiple back surgeries, cardiac cath, DM   Clinical Impression  Pt admitted with above diagnosis. At the time of PT eval, pt was able to demonstrate transfers and ambulation with gross min guard assist to supervision for safety. Pt was educated on precautions, positioning recommendations, appropriate activity progression, and car transfer. Pt currently with functional limitations due to the deficits listed below (see PT Problem List). Pt will benefit from skilled PT to increase their independence and safety with mobility to allow discharge to the venue listed below.      Follow Up Recommendations Home health PT;Outpatient PT;Supervision for mobility/OOB    Equipment Recommendations  None recommended by PT (Pt refused RW - prefers her rollator despite safety concerns.)    Recommendations for Other Services       Precautions / Restrictions Precautions Precautions: Cervical Precaution Booklet Issued: Yes (comment) Precaution Comments: Reviewed handout and pt was cued for precautions during functional mobility. Required Braces or Orthoses:  (No brace needed order) Restrictions Weight Bearing Restrictions: No      Mobility  Bed Mobility Overal bed mobility: Needs Assistance Bed Mobility: Rolling;Sidelying to Sit;Sit to Sidelying Rolling: Supervision Sidelying to sit: Supervision     Sit to sidelying: Supervision General bed mobility comments: Poor maintenance of precautions and requires step-by-step VC's for proper technique.    Transfers Overall transfer level: Needs assistance Equipment used: Rolling walker (2 wheeled) Transfers: Sit to/from Stand Sit to Stand: Supervision         General transfer  comment: VC's for hand placement on seated surface for safety. No assist to power-up to full stand but close supervision was required for safety.  Ambulation/Gait Ambulation/Gait assistance: Supervision;Min guard Gait Distance (Feet): 300 Feet Assistive device: Rolling walker (2 wheeled) Gait Pattern/deviations: Step-through pattern;Decreased stride length;Trunk flexed Gait velocity: Decreased Gait velocity interpretation: <1.8 ft/sec, indicate of risk for recurrent falls General Gait Details: VC's throughout for improved posture, closer walker proximity, and forward gaze. Poor maintenance of precautions throughout.  Stairs            Wheelchair Mobility    Modified Rankin (Stroke Patients Only)       Balance Overall balance assessment: Modified Independent                                           Pertinent Vitals/Pain Pain Assessment: Faces Faces Pain Scale: Hurts a little bit Pain Location: neck Pain Descriptors / Indicators: Sore Pain Intervention(s): Monitored during session;Limited activity within patient's tolerance;Repositioned    Home Living Family/patient expects to be discharged to:: Private residence Living Arrangements: Spouse/significant other Available Help at Discharge: Family;Available PRN/intermittently Type of Home: Mobile home (Double wide) Home Access: Ramped entrance     Home Layout: One level Home Equipment: Walker - 4 wheels;Cane - single point;Shower seat;Grab bars - tub/shower;Toilet riser (does not use toilet riser or shower chair-doesn't like them)      Prior Function Level of Independence: Independent with assistive device(s)         Comments: reports fall 3 days ago onto toilet and bruising her back. primarily uses rollator for ambulation. spouse does assist patient in/out of shower  and stays nearby if she needs assist. Typically washes her back for her.     Hand Dominance   Dominant Hand:  (did not  specify)    Extremity/Trunk Assessment   Upper Extremity Assessment Upper Extremity Assessment: Defer to OT evaluation    Lower Extremity Assessment Lower Extremity Assessment: Generalized weakness    Cervical / Trunk Assessment Cervical / Trunk Assessment: Other exceptions Cervical / Trunk Exceptions: s/p surgery  Communication   Communication: No difficulties  Cognition Arousal/Alertness: Awake/alert Behavior During Therapy: WFL for tasks assessed/performed Overall Cognitive Status: Within Functional Limits for tasks assessed                                        General Comments      Exercises     Assessment/Plan    PT Assessment Patient needs continued PT services  PT Problem List Decreased strength;Decreased balance;Decreased activity tolerance;Decreased mobility;Decreased knowledge of use of DME;Decreased safety awareness;Decreased knowledge of precautions;Pain       PT Treatment Interventions DME instruction;Gait training;Functional mobility training;Therapeutic activities;Therapeutic exercise;Neuromuscular re-education;Patient/family education    PT Goals (Current goals can be found in the Care Plan section)  Acute Rehab PT Goals Patient Stated Goal: home today PT Goal Formulation: With patient Time For Goal Achievement: 08/01/21 Potential to Achieve Goals: Good    Frequency Min 5X/week   Barriers to discharge        Co-evaluation               AM-PAC PT "6 Clicks" Mobility  Outcome Measure Help needed turning from your back to your side while in a flat bed without using bedrails?: A Little Help needed moving from lying on your back to sitting on the side of a flat bed without using bedrails?: A Little Help needed moving to and from a bed to a chair (including a wheelchair)?: A Little Help needed standing up from a chair using your arms (e.g., wheelchair or bedside chair)?: A Little Help needed to walk in hospital room?: A  Little Help needed climbing 3-5 steps with a railing? : A Little 6 Click Score: 18    End of Session Equipment Utilized During Treatment: Gait belt Activity Tolerance: Patient tolerated treatment well Patient left: in bed;with call bell/phone within reach Nurse Communication: Mobility status PT Visit Diagnosis: Unsteadiness on feet (R26.81);Pain Pain - part of body:  (neck)    Time: 4431-5400 PT Time Calculation (min) (ACUTE ONLY): 22 min   Charges:   PT Evaluation $PT Eval Low Complexity: 1 Low          Conni Slipper, PT, DPT Acute Rehabilitation Services Pager: [phone] Office: [phone]   Marylynn Pearson 07/25/2021, 9:02 AM

## 2021-07-25 NOTE — Discharge Summary (Signed)
Physician Discharge Summary  Patient ID: Jenna Butler MRN: [id number] DOB/AGE: [birth year] 70 y.o.  Admit date: 07/24/2021 Discharge date: 07/25/2021  Admission Diagnoses: cervical stenosis   Discharge Diagnoses: same   Discharged Condition: good  Hospital Course: The patient was admitted on 07/24/2021 and taken to the operating room where the patient underwent ACDF C5-6. The patient tolerated the procedure well and was taken to the recovery room and then to the floor in stable condition. The hospital course was routine. There were no complications. The wound remained clean dry and intact. Pt had appropriate neck soreness. No complaints of arm pain or new N/T/W. The patient remained afebrile with stable vital signs, and tolerated a regular diet. The patient continued to increase activities, and pain was well controlled with oral pain medications.   Consults: None  Significant Diagnostic Studies:  Results for orders placed or performed during the hospital encounter of 07/24/21  Glucose, capillary  Result Value Ref Range   Glucose-Capillary 88 70 - 99 mg/dL  Glucose, capillary  Result Value Ref Range   Glucose-Capillary 56 (L) 70 - 99 mg/dL  Glucose, capillary  Result Value Ref Range   Glucose-Capillary 98 70 - 99 mg/dL  Hemoglobin W0J  Result Value Ref Range   Hgb A1c MFr Bld 6.2 (H) 4.8 - 5.6 %   Mean Plasma Glucose 131 mg/dL  Glucose, capillary  Result Value Ref Range   Glucose-Capillary 121 (H) 70 - 99 mg/dL  Glucose, capillary  Result Value Ref Range   Glucose-Capillary 241 (H) 70 - 99 mg/dL  Glucose, capillary  Result Value Ref Range   Glucose-Capillary 119 (H) 70 - 99 mg/dL   Comment 1 Notify RN    Comment 2 Document in Chart   Glucose, capillary  Result Value Ref Range   Glucose-Capillary 105 (H) 70 - 99 mg/dL   Comment 1 Notify RN    Comment 2 Document in Chart     Chest 2 View  Result Date: 07/22/2021 CLINICAL DATA:  70 year old female with preoperative chest  x-ray EXAM: CHEST - 2 VIEW COMPARISON:  11/12/2016 FINDINGS: Cardiomediastinal silhouette unchanged in size and contour. No evidence of central vascular congestion. No interlobular septal thickening. No pneumothorax or pleural effusion. Coarsened interstitial markings, with no confluent airspace disease. No acute displaced fracture. Degenerative changes of the spine. IMPRESSION: No active cardiopulmonary disease. Electronically Signed   By: Gilmer Mor D.O.   On: 07/22/2021 11:23   DG Cervical Spine 1 View  Result Date: 07/24/2021 CLINICAL DATA:  Surgery, elective Z41.9 (ICD-10-CM). Additional history provided: ACDF of C5-6. Provided fluoroscopy time 20 seconds (6.38 mGy). EXAM: DG CERVICAL SPINE - 1 VIEW; DG C-ARM 1-60 MIN COMPARISON:  Cervical spine MRI 06/15/2021. FINDINGS: Two lateral view intraoperative fluoroscopic images of the cervical spine are submitted. On the provided images, ACDF hardware is present at the C5-C6 level (ventral plate and screws, as well as interbody device). C3-C4 grade 1 anterolisthesis. IMPRESSION: Two lateral view intraoperative fluoroscopic images of the cervical spine from C5-C6 ACDF. Electronically Signed   By: Jackey Loge DO   On: 07/24/2021 10:17   DG C-Arm 1-60 Min  Result Date: 07/24/2021 CLINICAL DATA:  Surgery, elective Z41.9 (ICD-10-CM). Additional history provided: ACDF of C5-6. Provided fluoroscopy time 20 seconds (6.38 mGy). EXAM: DG CERVICAL SPINE - 1 VIEW; DG C-ARM 1-60 MIN COMPARISON:  Cervical spine MRI 06/15/2021. FINDINGS: Two lateral view intraoperative fluoroscopic images of the cervical spine are submitted. On the provided images, ACDF hardware is present at  the C5-C6 level (ventral plate and screws, as well as interbody device). C3-C4 grade 1 anterolisthesis. IMPRESSION: Two lateral view intraoperative fluoroscopic images of the cervical spine from C5-C6 ACDF. Electronically Signed   By: Jackey Loge DO   On: 07/24/2021 10:17    Antibiotics:   Anti-infectives (From admission, onward)    Start     Dose/Rate Route Frequency Ordered Stop   07/24/21 1900  vancomycin (VANCOCIN) IVPB 1000 mg/200 mL premix        1,000 mg 200 mL/hr over 60 Minutes Intravenous  Once 07/24/21 1105 07/24/21 2126   07/24/21 1145  doxycycline (VIBRAMYCIN) capsule 100 mg  Status:  Discontinued        100 mg Oral 2 times daily 07/24/21 1056 07/24/21 1110   07/24/21 0600  vancomycin (VANCOREADY) IVPB 1500 mg/300 mL        1,500 mg 150 mL/hr over 120 Minutes Intravenous On call to O.R. 07/23/21 0849 07/24/21 0854       Discharge Exam: Blood pressure (!) 110/57, pulse 67, temperature 97.9 F (36.6 C), resp. rate 16, SpO2 100 %. Neurologic: Grossly normal Dressing dry  Discharge Medications:   Allergies as of 07/25/2021       Reactions   Amoxicillin Shortness Of Breath, Rash   Ceclor [cefaclor] Shortness Of Breath, Itching, Swelling, Rash   Lyrica [pregabalin] Other (See Comments)   "causes me to be very depressed."   Tape Dermatitis   "tears my skin."        Medication List     TAKE these medications    albuterol 108 (90 Base) MCG/ACT inhaler Commonly known as: VENTOLIN HFA Inhale 2 puffs into the lungs every 6 (six) hours as needed for wheezing or shortness of breath.   aspirin EC 81 MG tablet Take 81 mg by mouth daily.   busPIRone 15 MG tablet Commonly known as: BUSPAR Take 15-30 mg by mouth See admin instructions. Take 15 mg by mouth in the morning and 30 mg in the evening.   CALCIUM PO Take 1 tablet by mouth daily.   cetirizine 10 MG tablet Commonly known as: ZYRTEC Take 10 mg by mouth daily.   clonazePAM 0.5 MG tablet Commonly known as: KLONOPIN Take 0.5 mg by mouth daily as needed for anxiety.   doxycycline 100 MG capsule Commonly known as: VIBRAMYCIN Take 1 capsule (100 mg total) by mouth 2 (two) times daily.   furosemide 20 MG tablet Commonly known as: LASIX Take 1 tablet (20 mg total) by mouth daily. Has not had  Rx recently--would like a refill   gabapentin 300 MG capsule Commonly known as: NEURONTIN Take 600 mg by mouth 4 (four) times daily as needed (pain).   Garlic 1200 MG Caps Take 1,200 mg by mouth daily.   gemfibrozil 600 MG tablet Commonly known as: LOPID Take 600 mg by mouth 2 (two) times daily before a meal.   glipiZIDE 10 MG tablet Commonly known as: GLUCOTROL Take 10 mg by mouth 2 (two) times daily before a meal.   glucose 4 GM chewable tablet Chew 1 tablet by mouth as needed for low blood sugar.   insulin glargine 100 UNIT/ML injection Commonly known as: LANTUS Inject 8 Units into the skin at bedtime.   lamoTRIgine 200 MG tablet Commonly known as: LAMICTAL Take 200 mg by mouth 2 (two) times daily.   levothyroxine 200 MCG tablet Commonly known as: SYNTHROID Take 200 mcg by mouth daily before breakfast.   levothyroxine 25 MCG tablet Commonly  known as: SYNTHROID Take 25 mcg by mouth daily.   lisinopril 20 MG tablet Commonly known as: ZESTRIL Take 20 mg by mouth daily.   meloxicam 7.5 MG tablet Commonly known as: MOBIC Take 7.5 mg by mouth daily.   metoprolol succinate 25 MG 24 hr tablet Commonly known as: TOPROL-XL Take 25 mg by mouth every evening.   montelukast 10 MG tablet Commonly known as: SINGULAIR Take 10 mg by mouth daily.   oxyCODONE 5 MG immediate release tablet Commonly known as: Oxy IR/ROXICODONE Take 1 tablet (5 mg total) by mouth every 6 (six) hours as needed for moderate pain ((score 4 to 6)).   phenytoin 100 MG ER capsule Commonly known as: DILANTIN Take 100 mg by mouth 3 (three) times daily.   topiramate 50 MG tablet Commonly known as: TOPAMAX Take 50 mg by mouth 2 (two) times daily.   traZODone 100 MG tablet Commonly known as: DESYREL Take 100 mg by mouth at bedtime as needed for sleep.   triamterene-hydrochlorothiazide 37.5-25 MG tablet Commonly known as: MAXZIDE-25 Take 1 tablet by mouth daily.   venlafaxine XR 37.5 MG 24 hr  capsule Commonly known as: EFFEXOR-XR Take 37.5 mg by mouth daily with breakfast.   venlafaxine XR 150 MG 24 hr capsule Commonly known as: EFFEXOR-XR Take 150 mg by mouth daily after lunch.               Durable Medical Equipment  (From admission, onward)           Start     Ordered   07/24/21 1057  DME Walker rolling  Once       Question Answer Comment  Walker: With 5 Inch Wheels   Patient needs a walker to treat with the following condition Gait instability      07/24/21 1056            Disposition: home   Final Dx: ACDF C5-6  Discharge Instructions     Call MD for:  difficulty breathing, headache or visual disturbances   Complete by: As directed    Call MD for:  persistant nausea and vomiting   Complete by: As directed    Call MD for:  redness, tenderness, or signs of infection (pain, swelling, redness, odor or green/yellow discharge around incision site)   Complete by: As directed    Call MD for:  severe uncontrolled pain   Complete by: As directed    Call MD for:  temperature >100.4   Complete by: As directed    Diet - low sodium heart healthy   Complete by: As directed    Increase activity slowly   Complete by: As directed    Remove dressing in 48 hours   Complete by: As directed           Signed: Tia Alert 07/25/2021, 7:56 AM

## 2021-07-29 ENCOUNTER — Encounter (HOSPITAL_COMMUNITY): Payer: Self-pay | Admitting: Neurological Surgery

## 2021-07-29 DIAGNOSIS — Z20822 Contact with and (suspected) exposure to covid-19: Secondary | ICD-10-CM | POA: Diagnosis not present

## 2021-08-30 DIAGNOSIS — Z20822 Contact with and (suspected) exposure to covid-19: Secondary | ICD-10-CM | POA: Diagnosis not present

## 2021-09-03 DIAGNOSIS — M5412 Radiculopathy, cervical region: Secondary | ICD-10-CM | POA: Diagnosis not present

## 2021-09-03 DIAGNOSIS — M48062 Spinal stenosis, lumbar region with neurogenic claudication: Secondary | ICD-10-CM | POA: Diagnosis not present

## 2021-09-04 ENCOUNTER — Other Ambulatory Visit: Payer: Self-pay | Admitting: Neurological Surgery

## 2021-09-04 DIAGNOSIS — E538 Deficiency of other specified B group vitamins: Secondary | ICD-10-CM | POA: Diagnosis not present

## 2021-09-04 DIAGNOSIS — Z23 Encounter for immunization: Secondary | ICD-10-CM | POA: Diagnosis not present

## 2021-09-04 DIAGNOSIS — Z955 Presence of coronary angioplasty implant and graft: Secondary | ICD-10-CM | POA: Diagnosis not present

## 2021-09-04 DIAGNOSIS — Z780 Asymptomatic menopausal state: Secondary | ICD-10-CM | POA: Diagnosis not present

## 2021-09-04 DIAGNOSIS — E782 Mixed hyperlipidemia: Secondary | ICD-10-CM | POA: Diagnosis not present

## 2021-09-04 DIAGNOSIS — I2510 Atherosclerotic heart disease of native coronary artery without angina pectoris: Secondary | ICD-10-CM | POA: Diagnosis not present

## 2021-09-04 DIAGNOSIS — T466X5A Adverse effect of antihyperlipidemic and antiarteriosclerotic drugs, initial encounter: Secondary | ICD-10-CM | POA: Diagnosis not present

## 2021-09-04 DIAGNOSIS — I10 Essential (primary) hypertension: Secondary | ICD-10-CM | POA: Diagnosis not present

## 2021-09-04 DIAGNOSIS — E039 Hypothyroidism, unspecified: Secondary | ICD-10-CM | POA: Diagnosis not present

## 2021-09-04 DIAGNOSIS — G40909 Epilepsy, unspecified, not intractable, without status epilepticus: Secondary | ICD-10-CM | POA: Diagnosis not present

## 2021-09-04 DIAGNOSIS — M791 Myalgia, unspecified site: Secondary | ICD-10-CM | POA: Diagnosis not present

## 2021-09-04 DIAGNOSIS — E1165 Type 2 diabetes mellitus with hyperglycemia: Secondary | ICD-10-CM | POA: Diagnosis not present

## 2021-09-11 NOTE — Pre-Procedure Instructions (Signed)
Jenna Butler  09/11/2021     Your procedure is scheduled on Mon., Sept. 19, 2022 from 9:45AM-12:46PM.  Report to [facility] Entrance "A" at 7:45AM  Call this number if you have problems the morning of surgery:  [phone]   Remember:  Do not eat or drink after midnight on Sept. 18th    Take these medicines the morning of surgery with A SIP OF WATER: BusPIRone (BUSPAR)  Cetirizine (ZYRTEC)  LamoTRIgine (LAMICTAL) Levothyroxine (SYNTHROID)  Montelukast (SINGULAIR) Phenytoin (DILANTIN) Topiramate (TOPAMAX)  Venlafaxine XR (EFFEXOR-XR)  If Needed: ClonazePAM (KLONOPIN) Gabapentin (NEURONTIN) OxyCODONE (OXY IR/ROXICODONE Albuterol Inhaler- bring day of surgery  Follow your surgeon's instructions on when to stop Aspirin.  If no instructions were given by your surgeon then you will need to call the office to get those instructions.    As of today, STOP taking all Aspirin (unless instructed by your doctor) and Other Aspirin containing products, Vitamins, Fish oils, and Herbal medications. Also stop all NSAIDS i.e. Advil, Ibuprofen, Meloxicam (MOBIC), Motrin, Aleve, Anaprox, Naproxen, BC, Goody Powders, and all Supplements.    WHAT DO I DO ABOUT MY DIABETES MEDICATION?  Do not take the evening dose of GlipiZIDE (GLUCOTROL) and do not take the morning of surgery.  THE NIGHT BEFORE SURGERY, take _____4______ units of _____Insulin glargine (LANTUS)______insulin.   How to Manage Your Diabetes Before and After Surgery  Why is it important to control my blood sugar before and after surgery? Improving blood sugar levels before and after surgery helps healing and can limit problems. A way of improving blood sugar control is eating a healthy diet by:  Eating less sugar and carbohydrates  Increasing activity/exercise  Talking with your doctor about reaching your blood sugar goals High blood sugars (greater than 180 mg/dL) can raise your risk of infections and slow your  recovery, so you will need to focus on controlling your diabetes during the weeks before surgery. Make sure that the doctor who takes care of your diabetes knows about your planned surgery including the date and location.  How do I manage my blood sugar before surgery? Check your blood sugar at least 4 times a day, starting 2 days before surgery, to make sure that the level is not too high or low. Check your blood sugar the morning of your surgery when you wake up and every 2 hours until you get to the Short Stay unit. If your blood sugar is less than 70 mg/dL, you will need to treat for low blood sugar: Do not take insulin. Treat a low blood sugar (less than 70 mg/dL) with  cup of clear juice (cranberry or apple), 4 glucose tablets, OR glucose gel. Recheck blood sugar in 15 minutes after treatment (to make sure it is greater than 70 mg/dL). If your blood sugar is not greater than 70 mg/dL on recheck, call [phone]  for further instructions.  If your CBG is greater than 220 mg/dL, inform the staff upon arrival to Short Stay.  If you are admitted to the hospital after surgery: Your blood sugar will be checked by the staff and you will probably be given insulin after surgery (instead of oral diabetes medicines) to make sure you have good blood sugar levels. The goal for blood sugar control after surgery is 80-180 mg/dL.    Reviewed and Endorsed by [facility] Patient Education Committee, August 2015  No Smoking of any kind, Tobacco/Vaping, or Alcohol products 24 hours prior to your procedure. If you use  a Cpap at night, you may bring all equipment for your overnight stay.    Day of Surgery:  Do not wear jewelry, make-up. Do Not wear nail polish, gel polish, artificial nails, or any other type of covering on  natural nails including finger and toenails. If patients have artificial nails, gel coating, etc. that need to be removed by a nail salon please have this removed prior to surgery or  surgery may need to be canceled/delayed if the surgeon/ anesthesia feels like the patient is unable to be adequately monitored.  Do not wear lotions, powders, or perfumes, or deodorant.  Do not shave 48 hours prior to surgery.    Do not bring valuables to the hospital.  [facility] is not responsible for any belongings or valuables.  Contacts, dentures or bridgework may not be worn into surgery.    For patients admitted to the hospital, discharge time will be determined by your treatment team.  Patients discharged the day of surgery will not be allowed to drive home, and someone age 36 and over needs to stay with them for 24 hours.  Oral Hygiene is also important to reduce your risk of infection.  Remember - BRUSH YOUR TEETH THE MORNING OF SURGERY WITH YOUR REGULAR TOOTHPASTE  Special instructions:  [place]- Preparing For Surgery  Before surgery, you can play an important role. Because skin is not sterile, your skin needs to be as free of germs as possible. You can reduce the number of germs on your skin by washing with CHG (chlorahexidine gluconate) Soap before surgery.  CHG is an antiseptic cleaner which kills germs and bonds with the skin to continue killing germs even after washing.    Please do not use if you have an allergy to CHG or antibacterial soaps. If your skin becomes reddened/irritated stop using the CHG.  Do not shave (including legs and underarms) for at least 48 hours prior to first CHG shower. It is OK to shave your face.  Please follow these instructions carefully.   Shower the NIGHT BEFORE SURGERY and the MORNING OF SURGERY with CHG.   If you chose to wash your hair, wash your hair first as usual with your normal shampoo.  After you shampoo, rinse your hair and body thoroughly to remove the shampoo.  Use CHG as you would any other liquid soap. You can apply CHG directly to the skin and wash gently with a scrungie or a clean washcloth.   Apply the CHG Soap to  your body ONLY FROM THE NECK DOWN.  Do not use on open wounds or open sores. Avoid contact with your eyes, ears, mouth and genitals (private parts). Wash Face and genitals (private parts)  with your normal soap.  Wash thoroughly, paying special attention to the area where your surgery will be performed.  Thoroughly rinse your body with warm water from the neck down.  DO NOT shower/wash with your normal soap after using and rinsing off the CHG Soap.  Pat yourself dry with a CLEAN TOWEL.  Wear CLEAN PAJAMAS to bed the night before surgery, wear comfortable clothes the morning of surgery  Place CLEAN SHEETS on your bed the night of your first shower and DO NOT SLEEP WITH PETS.  Reminders: Do not apply any deodorants/lotions.  Please wear clean clothes to the hospital/surgery center.   Remember to brush your teeth WITH YOUR REGULAR TOOTHPASTE.  Please read over the following fact sheets that you were given.

## 2021-09-12 ENCOUNTER — Other Ambulatory Visit: Payer: Self-pay

## 2021-09-12 ENCOUNTER — Encounter (HOSPITAL_COMMUNITY)
Admission: RE | Admit: 2021-09-12 | Discharge: 2021-09-12 | Disposition: A | Discharge status: Home / Self Care | Payer: Medicare Other | Source: Ambulatory Visit | Attending: Neurological Surgery | Admitting: Neurological Surgery

## 2021-09-12 ENCOUNTER — Encounter (HOSPITAL_COMMUNITY): Payer: Self-pay

## 2021-09-12 DIAGNOSIS — Z20822 Contact with and (suspected) exposure to covid-19: Secondary | ICD-10-CM | POA: Insufficient documentation

## 2021-09-12 DIAGNOSIS — F419 Anxiety disorder, unspecified: Secondary | ICD-10-CM | POA: Diagnosis not present

## 2021-09-12 DIAGNOSIS — Z01812 Encounter for preprocedural laboratory examination: Secondary | ICD-10-CM | POA: Diagnosis not present

## 2021-09-12 DIAGNOSIS — M4800 Spinal stenosis, site unspecified: Secondary | ICD-10-CM | POA: Diagnosis not present

## 2021-09-12 DIAGNOSIS — G4700 Insomnia, unspecified: Secondary | ICD-10-CM | POA: Diagnosis not present

## 2021-09-12 DIAGNOSIS — E119 Type 2 diabetes mellitus without complications: Secondary | ICD-10-CM | POA: Insufficient documentation

## 2021-09-12 DIAGNOSIS — F331 Major depressive disorder, recurrent, moderate: Secondary | ICD-10-CM | POA: Diagnosis not present

## 2021-09-12 DIAGNOSIS — F4310 Post-traumatic stress disorder, unspecified: Secondary | ICD-10-CM | POA: Diagnosis not present

## 2021-09-12 LAB — CBC
HCT: 35 % — ABNORMAL LOW (ref 36.0–46.0)
Hemoglobin: 11.3 g/dL — ABNORMAL LOW (ref 12.0–15.0)
MCH: 29.6 pg (ref 26.0–34.0)
MCHC: 32.3 g/dL (ref 30.0–36.0)
MCV: 91.6 fL (ref 80.0–100.0)
Platelets: 214 10*3/uL (ref 150–400)
RBC: 3.82 MIL/uL — ABNORMAL LOW (ref 3.87–5.11)
RDW: 14.8 % (ref 11.5–15.5)
WBC: 6 10*3/uL (ref 4.0–10.5)
nRBC: 0 % (ref 0.0–0.2)

## 2021-09-12 LAB — TYPE AND SCREEN
ABO/RH(D): O POS
Antibody Screen: NEGATIVE

## 2021-09-12 LAB — BASIC METABOLIC PANEL
Anion gap: 9 (ref 5–15)
BUN: 35 mg/dL — ABNORMAL HIGH (ref 8–23)
CO2: 27 mmol/L (ref 22–32)
Calcium: 9.8 mg/dL (ref 8.9–10.3)
Chloride: 104 mmol/L (ref 98–111)
Creatinine, Ser: 1.27 mg/dL — ABNORMAL HIGH (ref 0.44–1.00)
GFR, Estimated: 45 mL/min — ABNORMAL LOW (ref >60)
Glucose, Bld: 155 mg/dL — ABNORMAL HIGH (ref 70–99)
Potassium: 3.7 mmol/L (ref 3.5–5.1)
Sodium: 140 mmol/L (ref 135–145)

## 2021-09-12 LAB — SURGICAL PCR SCREEN
MRSA, PCR: NEGATIVE
Staphylococcus aureus: NEGATIVE

## 2021-09-12 LAB — GLUCOSE, CAPILLARY: Glucose-Capillary: 147 mg/dL — ABNORMAL HIGH (ref 70–99)

## 2021-09-12 LAB — SARS CORONAVIRUS 2 (TAT 6-24 HRS): SARS Coronavirus 2: NEGATIVE

## 2021-09-12 LAB — PROTIME-INR
INR: 1 (ref 0.8–1.2)
Prothrombin Time: 13.6 seconds (ref 11.4–15.2)

## 2021-09-12 NOTE — Pre-Procedure Instructions (Signed)
Jenna Butler  09/12/2021     Your procedure is scheduled on Mon., Sept. 19, 2022 from 9:45AM-12:46PM.  Report to [facility] Entrance "A" at 7:45AM  Call this number if you have problems the morning of surgery:  [phone]   Remember:  Do not eat or drink after midnight on Sept. 18th    Take these medicines the morning of surgery with A SIP OF WATER: BusPIRone (BUSPAR)  Cetirizine (ZYRTEC)  LamoTRIgine (LAMICTAL) Levothyroxine (SYNTHROID)  Montelukast (SINGULAIR) Phenytoin (DILANTIN) Topiramate (TOPAMAX)  Venlafaxine XR (EFFEXOR-XR)  If Needed: ClonazePAM (KLONOPIN) Gabapentin (NEURONTIN) OxyCODONE (OXY IR/ROXICODONE Albuterol Inhaler- bring day of surgery  Follow your surgeon's instructions on when to stop Aspirin.  If no instructions were given by your surgeon then you will need to call the office to get those instructions.    As of today, STOP taking all Aspirin (unless instructed by your doctor) and Other Aspirin containing products, Vitamins, Fish oils, and Herbal medications. Also stop all NSAIDS i.e. Advil, Ibuprofen, Meloxicam (MOBIC), Motrin, Aleve, Anaprox, Naproxen, BC, Goody Powders, and all Supplements.    WHAT DO I DO ABOUT MY DIABETES MEDICATION?  Do not take the evening dose of GlipiZIDE (GLUCOTROL) and do not take the morning of surgery.  THE NIGHT BEFORE SURGERY, take __3 _ units of _____Insulin glargine (LANTUS)______insulin.   How to Manage Your Diabetes Before and After Surgery  Why is it important to control my blood sugar before and after surgery? Improving blood sugar levels before and after surgery helps healing and can limit problems. A way of improving blood sugar control is eating a healthy diet by:  Eating less sugar and carbohydrates  Increasing activity/exercise  Talking with your doctor about reaching your blood sugar goals High blood sugars (greater than 180 mg/dL) can raise your risk of infections and slow your recovery,  so you will need to focus on controlling your diabetes during the weeks before surgery. Make sure that the doctor who takes care of your diabetes knows about your planned surgery including the date and location.  How do I manage my blood sugar before surgery? Check your blood sugar at least 4 times a day, starting 2 days before surgery, to make sure that the level is not too high or low. Check your blood sugar the morning of your surgery when you wake up and every 2 hours until you get to the Short Stay unit. If your blood sugar is less than 70 mg/dL, you will need to treat for low blood sugar: Do not take insulin. Treat a low blood sugar (less than 70 mg/dL) with  cup of clear juice (cranberry or apple), 4 glucose tablets, OR glucose gel. Recheck blood sugar in 15 minutes after treatment (to make sure it is greater than 70 mg/dL). If your blood sugar is not greater than 70 mg/dL on recheck, call [phone]  for further instructions.  If your CBG is greater than 220 mg/dL, inform the staff upon arrival to Short Stay.  If you are admitted to the hospital after surgery: Your blood sugar will be checked by the staff and you will probably be given insulin after surgery (instead of oral diabetes medicines) to make sure you have good blood sugar levels. The goal for blood sugar control after surgery is 80-180 mg/dL.    Reviewed and Endorsed by [facility] Patient Education Committee, August 2015  No Smoking of any kind, Tobacco/Vaping, or Alcohol products 24 hours prior to your procedure. If you  use a Cpap at night, you may bring all equipment for your overnight stay.    Day of Surgery:  Do not wear jewelry, make-up. Do Not wear nail polish, gel polish, artificial nails, or any other type of covering on  natural nails including finger and toenails. If patients have artificial nails, gel coating, etc. that need to be removed by a nail salon please have this removed prior to surgery or surgery  may need to be canceled/delayed if the surgeon/ anesthesia feels like the patient is unable to be adequately monitored.  Do not wear lotions, powders, or perfumes, or deodorant.  Do not shave 48 hours prior to surgery.    Do not bring valuables to the hospital.  [facility] is not responsible for any belongings or valuables.  Contacts, dentures or bridgework may not be worn into surgery.    For patients admitted to the hospital, discharge time will be determined by your treatment team.  Patients discharged the day of surgery will not be allowed to drive home, and someone age 71 and over needs to stay with them for 24 hours.  Oral Hygiene is also important to reduce your risk of infection.  Remember - BRUSH YOUR TEETH THE MORNING OF SURGERY WITH YOUR REGULAR TOOTHPASTE  Special instructions:  [place]- Preparing For Surgery  Before surgery, you can play an important role. Because skin is not sterile, your skin needs to be as free of germs as possible. You can reduce the number of germs on your skin by washing with CHG (chlorahexidine gluconate) Soap before surgery.  CHG is an antiseptic cleaner which kills germs and bonds with the skin to continue killing germs even after washing.    Please do not use if you have an allergy to CHG or antibacterial soaps. If your skin becomes reddened/irritated stop using the CHG.  Do not shave (including legs and underarms) for at least 48 hours prior to first CHG shower. It is OK to shave your face.  Please follow these instructions carefully.   Shower the NIGHT BEFORE SURGERY and the MORNING OF SURGERY with CHG.   If you chose to wash your hair, wash your hair first as usual with your normal shampoo.  After you shampoo, rinse your hair and body thoroughly to remove the shampoo.  Use CHG as you would any other liquid soap. You can apply CHG directly to the skin and wash gently with a scrungie or a clean washcloth.   Apply the CHG Soap to your body  ONLY FROM THE NECK DOWN.  Do not use on open wounds or open sores. Avoid contact with your eyes, ears, mouth and genitals (private parts). Wash Face and genitals (private parts)  with your normal soap.  Wash thoroughly, paying special attention to the area where your surgery will be performed.  Thoroughly rinse your body with warm water from the neck down.  DO NOT shower/wash with your normal soap after using and rinsing off the CHG Soap.  Pat yourself dry with a CLEAN TOWEL.  Wear CLEAN PAJAMAS to bed the night before surgery, wear comfortable clothes the morning of surgery  Place CLEAN SHEETS on your bed the night of your first shower and DO NOT SLEEP WITH PETS.  Reminders: Do not apply any deodorants/lotions.  Please wear clean clothes to the hospital/surgery center.   Remember to brush your teeth WITH YOUR REGULAR TOOTHPASTE.  Please read over the following fact sheets that you were given.

## 2021-09-12 NOTE — Progress Notes (Signed)
PCP - Dr. Loyola Mast Cardiologist - Dr. Vilinda Boehringer  PPM/ICD - n/a Device Orders - n/a Rep Notified - n/a  Chest x-ray - 07/22/21 EKG - 07/22/21 Stress Test - 11/16/20 ECHO - 04/25/2013 Cardiac Cath - 2005  Sleep Study - Yes CPAP - Yes. Not using.  CBG today- 147 Checks Blood Sugar twice a day A1C- 6.2 on 07/22/21 Fasting blood sugar- 120-150  Blood Thinner Instructions:n/a Aspirin Instructions: Follow your surgeon's instructions on when to stop Aspirin.  If no instructions were given by your surgeon then you will need to call the office to get those instructions.     As of today, STOP taking all Aspirin (unless instructed by your doctor) and Other Aspirin containing products, Vitamins, Fish oils, and Herbal medications. Also stop all NSAIDS i.e. Advil, Ibuprofen, Meloxicam (MOBIC), Motrin, Aleve, Anaprox, Naproxen, BC, Goody Powders, and all Supplements  ERAS Protcol - No PRE-SURGERY Ensure or G2- n/a  COVID TEST- 09/12/21 at PAT appointment   Anesthesia review: Yes. Cardiac history. Reviewed for prior surgery on 07/24/21  Patient denies shortness of breath, fever, cough and chest pain at PAT appointment   All instructions explained to the patient, with a verbal understanding of the material. Patient agrees to go over the instructions while at home for a better understanding. Patient also instructed to self quarantine after being tested for COVID-19. The opportunity to ask questions was provided.

## 2021-09-13 NOTE — Anesthesia Preprocedure Evaluation (Addendum)
Anesthesia Evaluation  Patient identified by MRN, date of birth, ID band Patient awake    Reviewed: Allergy & Precautions, NPO status , Patient's Chart, lab work & pertinent test results  History of Anesthesia Complications (+) Family history of anesthesia reaction  Airway Mallampati: II  TM Distance: >3 FB Neck ROM: Full    Dental  (+) Edentulous Upper, Edentulous Lower   Pulmonary asthma , sleep apnea and Continuous Positive Airway Pressure Ventilation , COPD,  COPD inhaler, former smoker,    Pulmonary exam normal        Cardiovascular hypertension, Pt. on medications and Pt. on home beta blockers + CAD and + Cardiac Stents   Rhythm:Regular Rate:Normal     Neuro/Psych  Headaches, Seizures -, Well Controlled,     GI/Hepatic negative GI ROS, Neg liver ROS,   Endo/Other  diabetes, Type 2, Insulin Dependent, Oral Hypoglycemic AgentsHypothyroidism   Renal/GU negative Renal ROS  negative genitourinary   Musculoskeletal  (+) Arthritis , Osteoarthritis,  Fibromyalgia -Spinal stenosis   Abdominal (+)  Abdomen: soft. Bowel sounds: normal.  Peds  Hematology  (+) anemia ,   Anesthesia Other Findings   Reproductive/Obstetrics                          Anesthesia Physical Anesthesia Plan  ASA: 3  Anesthesia Plan: General   Post-op Pain Management:    Induction: Intravenous  PONV Risk Score and Plan: 3 and Ondansetron, Dexamethasone and Treatment may vary due to age or medical condition  Airway Management Planned: Mask and Oral ETT  Additional Equipment: None  Intra-op Plan:   Post-operative Plan: Extubation in OR  Informed Consent: I have reviewed the patients History and Physical, chart, labs and discussed the procedure including the risks, benefits and alternatives for the proposed anesthesia with the patient or authorized representative who has indicated his/her understanding and  acceptance.     Dental advisory given  Plan Discussed with: CRNA  Anesthesia Plan Comments: (PAT note written 09/13/2021 by Shonna Chock, PA-C.  Lab Results      Component                Value               Date                      WBC                      6.0                 09/12/2021                HGB                      11.3 (L)            09/12/2021                HCT                      35.0 (L)            09/12/2021                MCV                      91.6  09/12/2021                PLT                      214                 09/12/2021           Lab Results      Component                Value               Date                      NA                       140                 09/12/2021                K                        3.7                 09/12/2021                CO2                      27                  09/12/2021                GLUCOSE                  155 (H)             09/12/2021                BUN                      35 (H)              09/12/2021                CREATININE               1.27 (H)            09/12/2021                CALCIUM                  9.8                 09/12/2021                GFRNONAA                 45 (L)              09/12/2021                GFRAA                    >60                 11/28/2016            NM Heart Spect Stress test 11/16/20 ([facility]): IMPRESSION:  Essentially normal  cardiac perfusion exam.  Left ventricular ejection fraction is calculated to be 84 %.  Dynamic imaging of the left ventricle demonstrates no wall motionabnormalities.  Prognostically this is a low risk scan. Echo 10/27/19 ([facility]): IMPRESSION: Right Ventricle: Right ventricle cavity appears normal.  . Right Ventricle: Systolic function is normal.  . Mitral Valve: There is mild annular calcification.  . Mitral Valve: Mitral valve structure is normal. The leaflets are mildly  thickened and calcified and  exhibit normal excursion. Tip of anterior  leaflet of mitral valve appears calcified.  . Aorta: The ascending aorta is at upper limit of normal to borderline  dilated 3.4cm. Aortic root size is at upper limit of normal -3.5 cm.  . LeftVentricle: Wall motion is within normal limits.  . Left Ventricle: Systolic function is normal with an ejection fraction  of 60%.  . Tricuspid Valve: The estimated pulmonary pressures are normal.  . Aortic Valve: The aortic valve is probably tricuspid. The leaflets are  not thickened and exhibit normal excursion.  . Left Ventricle: Unable to assess diastolic function. )      Anesthesia Quick Evaluation

## 2021-09-13 NOTE — Progress Notes (Signed)
Anesthesia Chart Review:  Case: [id number] Date/Time: 09/16/21 0930   Procedure: PLIF - L2-L3 with extension of hardware (Back)   Anesthesia type: General   Pre-op diagnosis: Stenosis   [facility] RO[facility] / [facility]   Surgeons: Tia Alert, MD       DISCUSSION: Patient is a 70 year old female scheduled for the above procedure. She is s/p C5-6 ACDF 7/'27/22.   History includes former smoker (quit 10/30/97), HTN, CAD (s/p LAD stent ~ 2005; s/p PCI to LAD 2008 mild disease elsewhere at that time according to cardiology notes), dysrhythmia (not specified), DM2, COPD, asthma, lung nodules, OSA (not using CPAP), RLS, fibromyalgia, seizures, neuropathy, hypothyroidism, anemia, back surgery (left L3-4 microdiscectomy/redo hemilaminectomy 11/03/14; L3-5 PLIF 11/27/16), neck surgery (C5-6 ACDF 07/24/21). For anesthesia history, she reported her mother woke up during a surgery. BMI is consistent with obesity.    She saw her pulmonologist Dr. Steele Berg on 07/09/2021.  Her asthma was felt stable  Since she was noncompliant with CPAP, he did order a future overnight oximetry. In regards to c-spine surgery (done on 07/24/21) he wrote, "Should tolerate cervical spine surgery without undue risk. Concerned about her CPAP non compliance however."    Last cardiology evaluation 02/12/21 with Kayren Eaves, NP. "The patient is clinically stable from a cardiovascular standpoint. She is not having any symptoms to suggest angina. She will continue on her current medical regimen." Low risk stress test 11/16/20.   Preoperative COVID-19 test on 09/12/2021 was negative. Anesthesia team to evaluate on the day of surgery.   VS: BP (!) 115/57   Pulse 87   Temp 36.5 C (Oral)   Resp 18   Ht 5\' 6"  (1.676 m)   Wt 109.3 kg   SpO2 98%   BMI 38.90 kg/m    PROVIDERS: PCP - , NP [facility], 04-26-1969).  Last visit 09/04/21. Cardiologist - 11/04/21, MD. Last visit 02/12/21 with 02/14/21,  NP with 8 month follow-up planned. ([facility]) Pulmonologist - Kayren Eaves, MD. Last visit 07/09/21 for follow-up mild persistent asthma, stable lung nodules followed yearly by CT, OSA with CPAP non-compliance. One year follow-up planned. ([facility]) Neurologist - 09/09/21, MD     LABS: Labs reviewed: Acceptable for surgery. Cr 1.27, BUN 35, previously 1.12 and 47, respectively on 07/22/21. A1c 6.2% 07/22/21. (all labs ordered are listed, but only abnormal results are displayed)  Labs Reviewed  GLUCOSE, CAPILLARY - Abnormal; Notable for the following components:      Result Value   Glucose-Capillary 147 (*)    All other components within normal limits  BASIC METABOLIC PANEL - Abnormal; Notable for the following components:   Glucose, Bld 155 (*)    BUN 35 (*)    Creatinine, Ser 1.27 (*)    GFR, Estimated 45 (*)    All other components within normal limits  CBC - Abnormal; Notable for the following components:   RBC 3.82 (*)    Hemoglobin 11.3 (*)    HCT 35.0 (*)    All other components within normal limits  SURGICAL PCR SCREEN  SARS CORONAVIRUS 2 (TAT 6-24 HRS)  PROTIME-INR  TYPE AND SCREEN    Spirometry 07/09/21 ([facility]):  Impression: Normal spirometry    IMAGES: CXR 07/22/21:  FINDINGS: - Cardiomediastinal silhouette unchanged in size and contour. No evidence of central vascular congestion. No interlobular septal thickening. - No pneumothorax or pleural effusion. Coarsened interstitial markings, with no confluent  airspace disease. - No acute displaced fracture. Degenerative changes of the spine. IMPRESSION: No active cardiopulmonary disease.    MRI L-spine 06/03/21: IMPRESSION: 1. Postsurgical changes from posterior and interbody fusion at L3-S1. No evidence of residual foraminal or canal stenosis at L3-4 or L4-5. Suspect at least moderate bilateral foraminal stenosis at L5-S1, partially obscured by metallic susceptibility artifact. 2. Adjacent segment  disease at L2-3 with moderate-to-severe canal stenosis and moderate bilateral foraminal stenosis. 3. Mild-to-moderate left and mild right foraminal stenosis at L1-2.  CT Chest 09/27/20 ([facility]): IMPRESSION:  - Stable size and appearance of many small pulmonary nodules..  - Stable left adrenal adenoma  RECOMMENDATIONS:  Continue annual Screening low dose chest CT (exam code [id number]).  Lung-RADSTM CATEGORY:  Category 2, Benign      EKG: 07/22/21: Normal sinus rhythm Low voltage QRS Borderline ECG No significant change since last tracing Confirmed by End, Christopher [phone]) on 07/22/2021 9:45:05 PM   CV: NM Heart Spect Stress test 11/16/20 ([facility]):  IMPRESSION:  Essentially normal cardiac perfusion exam.  Left ventricular ejection fraction is calculated to be 84 %.   Dynamic imaging of the left ventricle demonstrates no wall motionabnormalities.  Prognostically this is a low risk scan.     LLE Venous US 08/01/20 ([facility]): Impression:  No DVT identified.     Echo 10/27/19 ([facility]): IMPRESSION: Right Ventricle: Right ventricle cavity appears normal.    Right Ventricle: Systolic function is normal.    Mitral Valve: There is mild annular calcification.    Mitral Valve: Mitral valve structure is normal. The leaflets are mildly  thickened and calcified and exhibit normal excursion.  Tip of anterior  leaflet of mitral valve appears calcified.    Aorta: The ascending aorta is at upper limit of normal to borderline  dilated 3.4cm.  Aortic root size is at upper limit of normal -3.5 cm.    Left Ventricle: Wall motion is within normal limits.    Left Ventricle: Systolic function is normal with an ejection fraction  of 60%.    Tricuspid Valve: The estimated pulmonary pressures are normal.    Aortic Valve: The aortic valve is probably tricuspid. The leaflets are  not thickened and exhibit normal excursion.    Left Ventricle: Unable to assess diastolic function.     Reported last cath > 5 years ago.   Past Medical History:  Diagnosis Date   Anemia    Anxiety    Arthritis    Asthma    COPD (chronic obstructive pulmonary disease) (HCC)    Coronary artery disease    Depression    Diabetes mellitus without complication (HCC)    Dyspnea    Dysrhythmia    Family history of adverse reaction to anesthesia    mother woke up during surgery   Fibromyalgia    Headache    tension headaches and migraines   Hypertension    Hypothyroidism    Multiple lung nodules on CT    CT chest 09/27/20 ([facility]): Stable appearance of many small pulmonary nodules, followed by pulmonology   Neuropathy    both hands, arms, feet and legs   Restless legs    Seizures (HCC)    small, focal type seizures, on Dilantin and Lamictal   Sleep apnea    uses CPAP, haven't used CPAP in a while, getting a new one    Past Surgical History:  Procedure Laterality Date   ABDOMINAL HYSTERECTOMY  2000   ANTERIOR CERVICAL DECOMP/DISCECTOMY FUSION  N/A 07/24/2021   Procedure: CERVICAL FIVE-SIX ANTERIOR CERVICAL DECOMPRESSION/DISCECTOMY FUSION;  Surgeon: Tia Alert, MD;  Location: [facility] OR;  Service: Neurosurgery;  Laterality: N/A;   APPENDECTOMY  1972   BACK SURGERY     CARDIAC CATHETERIZATION  2005   1 stent [facility]   CHOLECYSTECTOMY  1974   CORONARY ANGIOPLASTY     EYE SURGERY     HAND SURGERY     Plastic surgery   LUMBAR LAMINECTOMY/DECOMPRESSION MICRODISCECTOMY Left 11/03/2014   Procedure: Left Lumbar three/four Intra/Extraformainal Microdiskectomy ;  Surgeon: Tia Alert, MD;  Location: [facility];  Service: Neurosurgery;  Laterality: Left;   SHOULDER SURGERY     TONSILLECTOMY     TRANSFORAMINAL LUMBAR INTERBODY FUSION (TLIF) WITH PEDICLE SCREW FIXATION 3 LEVEL N/A 11/27/2016   Procedure: Lumbar three-four, Lumbar four-five, Lumbar five-Sacral one Posterior Lumbar Interbody Fusion;  Surgeon: Tia Alert, MD;  Location: [facility] OR;  Service: Neurosurgery;   Laterality: N/A;   TUBAL LIGATION      MEDICATIONS:  Acetaminophen (TYLENOL 8 HOUR PO)   albuterol (PROVENTIL HFA;VENTOLIN HFA) 108 (90 BASE) MCG/ACT inhaler   aspirin EC 81 MG tablet   busPIRone (BUSPAR) 15 MG tablet   CALCIUM PO   cetirizine (ZYRTEC) 10 MG tablet   clonazePAM (KLONOPIN) 0.5 MG tablet   doxycycline (VIBRAMYCIN) 100 MG capsule   furosemide (LASIX) 20 MG tablet   gabapentin (NEURONTIN) 300 MG capsule   Garlic 1200 MG CAPS   gemfibrozil (LOPID) 600 MG tablet   glipiZIDE (GLUCOTROL) 10 MG tablet   glucose 4 GM chewable tablet   insulin glargine (LANTUS) 100 UNIT/ML injection   lamoTRIgine (LAMICTAL) 200 MG tablet   levothyroxine (SYNTHROID) 200 MCG tablet   levothyroxine (SYNTHROID) 25 MCG tablet   levothyroxine (SYNTHROID, LEVOTHROID) 200 MCG tablet   lisinopril (PRINIVIL,ZESTRIL) 20 MG tablet   meloxicam (MOBIC) 7.5 MG tablet   meloxicam (MOBIC) 7.5 MG tablet   metoprolol succinate (TOPROL-XL) 25 MG 24 hr tablet   montelukast (SINGULAIR) 10 MG tablet   oxyCODONE (OXY IR/ROXICODONE) 5 MG immediate release tablet   phenytoin (DILANTIN) 100 MG ER capsule   topiramate (TOPAMAX) 50 MG tablet   traZODone (DESYREL) 100 MG tablet   triamterene-hydrochlorothiazide (MAXZIDE-25) 37.5-25 MG tablet   venlafaxine XR (EFFEXOR-XR) 150 MG 24 hr capsule   venlafaxine XR (EFFEXOR-XR) 37.5 MG 24 hr capsule   No current facility-administered medications for this encounter.   Perioperative ASA instructions per surgeon.   Shonna Chock, PA-C Surgical Short Stay/Anesthesiology [facility] Phone [phone] [facility] Phone [phone] 09/13/2021 9:53 AM

## 2021-09-16 ENCOUNTER — Inpatient Hospital Stay (HOSPITAL_COMMUNITY): Payer: Medicare Other | Admitting: Anesthesiology

## 2021-09-16 ENCOUNTER — Encounter (HOSPITAL_COMMUNITY)
Admission: RE | Disposition: A | Discharge status: Home / Self Care | Payer: Self-pay | Source: Home / Self Care | Attending: Neurological Surgery

## 2021-09-16 ENCOUNTER — Inpatient Hospital Stay (HOSPITAL_COMMUNITY): Payer: Medicare Other | Admitting: Vascular Surgery

## 2021-09-16 ENCOUNTER — Inpatient Hospital Stay (HOSPITAL_COMMUNITY)
Admission: RE | Admit: 2021-09-16 | Discharge: 2021-09-17 | DRG: 455 | Disposition: A | Discharge status: Home / Self Care | Payer: Medicare Other | Attending: Neurological Surgery | Admitting: Neurological Surgery

## 2021-09-16 ENCOUNTER — Inpatient Hospital Stay (HOSPITAL_COMMUNITY): Payer: Medicare Other

## 2021-09-16 ENCOUNTER — Encounter (HOSPITAL_COMMUNITY): Payer: Self-pay | Admitting: Neurological Surgery

## 2021-09-16 DIAGNOSIS — Z419 Encounter for procedure for purposes other than remedying health state, unspecified: Secondary | ICD-10-CM

## 2021-09-16 DIAGNOSIS — E119 Type 2 diabetes mellitus without complications: Secondary | ICD-10-CM | POA: Diagnosis present

## 2021-09-16 DIAGNOSIS — G629 Polyneuropathy, unspecified: Secondary | ICD-10-CM | POA: Diagnosis present

## 2021-09-16 DIAGNOSIS — Z888 Allergy status to other drugs, medicaments and biological substances status: Secondary | ICD-10-CM

## 2021-09-16 DIAGNOSIS — Z91048 Other nonmedicinal substance allergy status: Secondary | ICD-10-CM

## 2021-09-16 DIAGNOSIS — Z981 Arthrodesis status: Secondary | ICD-10-CM | POA: Diagnosis not present

## 2021-09-16 DIAGNOSIS — J449 Chronic obstructive pulmonary disease, unspecified: Secondary | ICD-10-CM | POA: Diagnosis not present

## 2021-09-16 DIAGNOSIS — I10 Essential (primary) hypertension: Secondary | ICD-10-CM | POA: Diagnosis present

## 2021-09-16 DIAGNOSIS — Z6839 Body mass index (BMI) 39.0-39.9, adult: Secondary | ICD-10-CM | POA: Diagnosis not present

## 2021-09-16 DIAGNOSIS — Z7984 Long term (current) use of oral hypoglycemic drugs: Secondary | ICD-10-CM | POA: Diagnosis not present

## 2021-09-16 DIAGNOSIS — E039 Hypothyroidism, unspecified: Secondary | ICD-10-CM | POA: Diagnosis not present

## 2021-09-16 DIAGNOSIS — M532X6 Spinal instabilities, lumbar region: Secondary | ICD-10-CM | POA: Diagnosis not present

## 2021-09-16 DIAGNOSIS — Z880 Allergy status to penicillin: Secondary | ICD-10-CM

## 2021-09-16 DIAGNOSIS — M48061 Spinal stenosis, lumbar region without neurogenic claudication: Secondary | ICD-10-CM | POA: Diagnosis not present

## 2021-09-16 DIAGNOSIS — Z791 Long term (current) use of non-steroidal anti-inflammatories (NSAID): Secondary | ICD-10-CM | POA: Diagnosis not present

## 2021-09-16 DIAGNOSIS — F32A Depression, unspecified: Secondary | ICD-10-CM | POA: Diagnosis not present

## 2021-09-16 DIAGNOSIS — M1990 Unspecified osteoarthritis, unspecified site: Secondary | ICD-10-CM | POA: Diagnosis not present

## 2021-09-16 DIAGNOSIS — R569 Unspecified convulsions: Secondary | ICD-10-CM | POA: Diagnosis present

## 2021-09-16 DIAGNOSIS — G2581 Restless legs syndrome: Secondary | ICD-10-CM | POA: Diagnosis present

## 2021-09-16 DIAGNOSIS — Z820 Family history of epilepsy and other diseases of the nervous system: Secondary | ICD-10-CM

## 2021-09-16 DIAGNOSIS — E669 Obesity, unspecified: Secondary | ICD-10-CM | POA: Diagnosis not present

## 2021-09-16 DIAGNOSIS — F418 Other specified anxiety disorders: Secondary | ICD-10-CM | POA: Diagnosis not present

## 2021-09-16 DIAGNOSIS — Z8249 Family history of ischemic heart disease and other diseases of the circulatory system: Secondary | ICD-10-CM

## 2021-09-16 DIAGNOSIS — Z87891 Personal history of nicotine dependence: Secondary | ICD-10-CM | POA: Diagnosis not present

## 2021-09-16 DIAGNOSIS — F419 Anxiety disorder, unspecified: Secondary | ICD-10-CM | POA: Diagnosis present

## 2021-09-16 DIAGNOSIS — I2510 Atherosclerotic heart disease of native coronary artery without angina pectoris: Secondary | ICD-10-CM | POA: Diagnosis present

## 2021-09-16 DIAGNOSIS — M797 Fibromyalgia: Secondary | ICD-10-CM | POA: Diagnosis present

## 2021-09-16 DIAGNOSIS — G4730 Sleep apnea, unspecified: Secondary | ICD-10-CM | POA: Diagnosis not present

## 2021-09-16 DIAGNOSIS — Z20822 Contact with and (suspected) exposure to covid-19: Secondary | ICD-10-CM | POA: Diagnosis not present

## 2021-09-16 DIAGNOSIS — D649 Anemia, unspecified: Secondary | ICD-10-CM | POA: Diagnosis present

## 2021-09-16 DIAGNOSIS — M4326 Fusion of spine, lumbar region: Secondary | ICD-10-CM | POA: Diagnosis not present

## 2021-09-16 DIAGNOSIS — Z79890 Hormone replacement therapy (postmenopausal): Secondary | ICD-10-CM

## 2021-09-16 LAB — POCT I-STAT 7, (LYTES, BLD GAS, ICA,H+H)
Acid-Base Excess: 1 mmol/L (ref 0.0–2.0)
Bicarbonate: 25.9 mmol/L (ref 20.0–28.0)
Calcium, Ion: 1.24 mmol/L (ref 1.15–1.40)
HCT: 27 % — ABNORMAL LOW (ref 36.0–46.0)
Hemoglobin: 9.2 g/dL — ABNORMAL LOW (ref 12.0–15.0)
O2 Saturation: 100 %
Patient temperature: 35.8
Potassium: 3.7 mmol/L (ref 3.5–5.1)
Sodium: 139 mmol/L (ref 135–145)
TCO2: 27 mmol/L (ref 22–32)
pCO2 arterial: 42.1 mmHg (ref 32.0–48.0)
pH, Arterial: 7.393 (ref 7.350–7.450)
pO2, Arterial: 230 mmHg — ABNORMAL HIGH (ref 83.0–108.0)

## 2021-09-16 LAB — GLUCOSE, CAPILLARY
Glucose-Capillary: 82 mg/dL (ref 70–99)
Glucose-Capillary: 94 mg/dL (ref 70–99)
Glucose-Capillary: 171 mg/dL — ABNORMAL HIGH (ref 70–99)
Glucose-Capillary: 209 mg/dL — ABNORMAL HIGH (ref 70–99)

## 2021-09-16 SURGERY — POSTERIOR LUMBAR FUSION 1 LEVEL
Anesthesia: General | Site: Back

## 2021-09-16 MED ORDER — GEMFIBROZIL 600 MG PO TABS
600.0000 mg | ORAL_TABLET | Freq: Two times a day (BID) | ORAL | Status: DC
  Administered 2021-09-16 – 2021-09-17 (×2): 600 mg via ORAL
  Filled 2021-09-16 (×3): qty 1

## 2021-09-16 MED ORDER — OXYCODONE HCL 5 MG PO TABS
10.0000 mg | ORAL_TABLET | ORAL | Status: DC | PRN
  Administered 2021-09-16 – 2021-09-17 (×3): 10 mg via ORAL
  Filled 2021-09-16 (×3): qty 2

## 2021-09-16 MED ORDER — EPHEDRINE SULFATE-NACL 50-0.9 MG/10ML-% IV SOSY
PREFILLED_SYRINGE | INTRAVENOUS | Status: DC | PRN
  Administered 2021-09-16: 5 mg via INTRAVENOUS
  Administered 2021-09-16: 7 mg via INTRAVENOUS

## 2021-09-16 MED ORDER — DEXAMETHASONE SODIUM PHOSPHATE 10 MG/ML IJ SOLN
INTRAMUSCULAR | Status: DC | PRN
  Administered 2021-09-16: 10 mg via INTRAVENOUS

## 2021-09-16 MED ORDER — FUROSEMIDE 20 MG PO TABS
20.0000 mg | ORAL_TABLET | Freq: Every day | ORAL | Status: DC
  Administered 2021-09-17: 20 mg via ORAL
  Filled 2021-09-16: qty 1

## 2021-09-16 MED ORDER — CHLORHEXIDINE GLUCONATE CLOTH 2 % EX PADS: 6.0000 | MEDICATED_PAD | Freq: Once | CUTANEOUS | Status: DC

## 2021-09-16 MED ORDER — LEVOTHYROXINE SODIUM 25 MCG PO TABS: 25.0000 ug | ORAL_TABLET | Freq: Every day | ORAL | Status: DC

## 2021-09-16 MED ORDER — CHLORHEXIDINE GLUCONATE 0.12 % MT SOLN: 15.0000 mL | Freq: Once | OROMUCOSAL | Status: AC

## 2021-09-16 MED ORDER — CHLORHEXIDINE GLUCONATE 0.12 % MT SOLN
OROMUCOSAL | Status: AC
  Administered 2021-09-16: 15 mL via OROMUCOSAL
  Filled 2021-09-16: qty 15

## 2021-09-16 MED ORDER — DEXAMETHASONE SODIUM PHOSPHATE 10 MG/ML IJ SOLN: 10.0000 mg | Freq: Once | INTRAMUSCULAR | Status: DC

## 2021-09-16 MED ORDER — ONDANSETRON HCL 4 MG/2ML IJ SOLN
INTRAMUSCULAR | Status: DC | PRN
  Administered 2021-09-16: 4 mg via INTRAVENOUS

## 2021-09-16 MED ORDER — VENLAFAXINE HCL ER 37.5 MG PO CP24
37.5000 mg | ORAL_CAPSULE | Freq: Every day | ORAL | Status: DC
  Administered 2021-09-17: 37.5 mg via ORAL
  Filled 2021-09-16: qty 1

## 2021-09-16 MED ORDER — TRIAMTERENE-HCTZ 37.5-25 MG PO TABS
1.0000 | ORAL_TABLET | Freq: Every day | ORAL | Status: DC
  Administered 2021-09-17: 1 via ORAL
  Filled 2021-09-16: qty 1

## 2021-09-16 MED ORDER — LACTATED RINGERS IV SOLN: INTRAVENOUS | Status: DC

## 2021-09-16 MED ORDER — SUCCINYLCHOLINE CHLORIDE 200 MG/10ML IV SOSY
PREFILLED_SYRINGE | INTRAVENOUS | Status: AC
  Filled 2021-09-16: qty 10

## 2021-09-16 MED ORDER — PHENYLEPHRINE 40 MCG/ML (10ML) SYRINGE FOR IV PUSH (FOR BLOOD PRESSURE SUPPORT)
PREFILLED_SYRINGE | INTRAVENOUS | Status: DC | PRN
  Administered 2021-09-16 (×2): 80 ug via INTRAVENOUS

## 2021-09-16 MED ORDER — PROPOFOL 10 MG/ML IV BOLUS
INTRAVENOUS | Status: AC
  Filled 2021-09-16: qty 20

## 2021-09-16 MED ORDER — ORAL CARE MOUTH RINSE: 15.0000 mL | Freq: Once | OROMUCOSAL | Status: AC

## 2021-09-16 MED ORDER — FENTANYL CITRATE (PF) 100 MCG/2ML IJ SOLN
25.0000 ug | INTRAMUSCULAR | Status: DC | PRN
  Administered 2021-09-16: 50 ug via INTRAVENOUS

## 2021-09-16 MED ORDER — LAMOTRIGINE 100 MG PO TABS
200.0000 mg | ORAL_TABLET | Freq: Two times a day (BID) | ORAL | Status: DC
  Administered 2021-09-16 – 2021-09-17 (×2): 200 mg via ORAL
  Filled 2021-09-16 (×3): qty 2

## 2021-09-16 MED ORDER — OXYCODONE HCL 5 MG PO TABS
ORAL_TABLET | ORAL | Status: AC
  Filled 2021-09-16: qty 1

## 2021-09-16 MED ORDER — ALBUMIN HUMAN 5 % IV SOLN: INTRAVENOUS | Status: DC | PRN

## 2021-09-16 MED ORDER — GABAPENTIN 300 MG PO CAPS: 600.0000 mg | ORAL_CAPSULE | Freq: Four times a day (QID) | ORAL | Status: DC | PRN

## 2021-09-16 MED ORDER — METOPROLOL SUCCINATE ER 25 MG PO TB24
25.0000 mg | ORAL_TABLET | Freq: Once | ORAL | Status: AC
  Administered 2021-09-16: 25 mg via ORAL

## 2021-09-16 MED ORDER — OXYCODONE HCL 5 MG PO TABS
5.0000 mg | ORAL_TABLET | Freq: Once | ORAL | Status: AC | PRN
Start: 2021-09-16 — End: 2021-09-16
  Administered 2021-09-16: 5 mg via ORAL

## 2021-09-16 MED ORDER — FENTANYL CITRATE (PF) 250 MCG/5ML IJ SOLN
INTRAMUSCULAR | Status: DC | PRN
  Administered 2021-09-16 (×2): 50 ug via INTRAVENOUS
  Administered 2021-09-16: 100 ug via INTRAVENOUS
  Administered 2021-09-16: 50 ug via INTRAVENOUS

## 2021-09-16 MED ORDER — LEVOTHYROXINE SODIUM 100 MCG PO TABS: 200.0000 ug | ORAL_TABLET | Freq: Every day | ORAL | Status: DC

## 2021-09-16 MED ORDER — DEXAMETHASONE SODIUM PHOSPHATE 10 MG/ML IJ SOLN
INTRAMUSCULAR | Status: AC
  Filled 2021-09-16: qty 1

## 2021-09-16 MED ORDER — METHOCARBAMOL 500 MG PO TABS
500.0000 mg | ORAL_TABLET | Freq: Four times a day (QID) | ORAL | Status: DC | PRN
  Administered 2021-09-16 – 2021-09-17 (×3): 500 mg via ORAL
  Filled 2021-09-16 (×3): qty 1

## 2021-09-16 MED ORDER — SODIUM CHLORIDE 0.9 % IV SOLN: 250.0000 mL | INTRAVENOUS | Status: DC

## 2021-09-16 MED ORDER — PHENYLEPHRINE HCL (PRESSORS) 10 MG/ML IV SOLN
INTRAVENOUS | Status: AC
  Filled 2021-09-16: qty 2

## 2021-09-16 MED ORDER — MONTELUKAST SODIUM 10 MG PO TABS
10.0000 mg | ORAL_TABLET | Freq: Every day | ORAL | Status: DC
  Administered 2021-09-16 – 2021-09-17 (×2): 10 mg via ORAL
  Filled 2021-09-16 (×2): qty 1

## 2021-09-16 MED ORDER — PHENOL 1.4 % MT LIQD: 1.0000 | OROMUCOSAL | Status: DC | PRN

## 2021-09-16 MED ORDER — GABAPENTIN 300 MG PO CAPS
ORAL_CAPSULE | ORAL | Status: AC
  Administered 2021-09-16: 300 mg via ORAL
  Filled 2021-09-16: qty 1

## 2021-09-16 MED ORDER — SUGAMMADEX SODIUM 200 MG/2ML IV SOLN
INTRAVENOUS | Status: DC | PRN
  Administered 2021-09-16: 225 mg via INTRAVENOUS

## 2021-09-16 MED ORDER — OXYCODONE HCL 5 MG/5ML PO SOLN
5.0000 mg | Freq: Once | ORAL | Status: AC | PRN
Start: 2021-09-16 — End: 2021-09-16

## 2021-09-16 MED ORDER — THROMBIN 20000 UNITS EX SOLR
CUTANEOUS | Status: AC
  Filled 2021-09-16: qty 20000

## 2021-09-16 MED ORDER — HYDROMORPHONE HCL 1 MG/ML IJ SOLN
INTRAMUSCULAR | Status: DC | PRN
  Administered 2021-09-16: .5 mg via INTRAVENOUS

## 2021-09-16 MED ORDER — ONDANSETRON HCL 4 MG PO TABS: 4.0000 mg | ORAL_TABLET | Freq: Four times a day (QID) | ORAL | Status: DC | PRN

## 2021-09-16 MED ORDER — GLYCOPYRROLATE 0.2 MG/ML IJ SOLN
INTRAMUSCULAR | Status: DC | PRN
  Administered 2021-09-16: .02 mg via INTRAVENOUS

## 2021-09-16 MED ORDER — FENTANYL CITRATE (PF) 100 MCG/2ML IJ SOLN
INTRAMUSCULAR | Status: AC
  Filled 2021-09-16: qty 2

## 2021-09-16 MED ORDER — BUPIVACAINE HCL (PF) 0.25 % IJ SOLN
INTRAMUSCULAR | Status: AC
  Filled 2021-09-16: qty 30

## 2021-09-16 MED ORDER — DIPHENHYDRAMINE HCL 50 MG/ML IJ SOLN
INTRAMUSCULAR | Status: DC | PRN
  Administered 2021-09-16: 12.5 mg via INTRAVENOUS

## 2021-09-16 MED ORDER — ALBUTEROL SULFATE (2.5 MG/3ML) 0.083% IN NEBU: 2.5000 mg | INHALATION_SOLUTION | Freq: Four times a day (QID) | RESPIRATORY_TRACT | Status: DC | PRN

## 2021-09-16 MED ORDER — SODIUM CHLORIDE 0.9% FLUSH: 3.0000 mL | INTRAVENOUS | Status: DC | PRN

## 2021-09-16 MED ORDER — THROMBIN 5000 UNITS EX SOLR
CUTANEOUS | Status: AC
  Filled 2021-09-16: qty 5000

## 2021-09-16 MED ORDER — MORPHINE SULFATE (PF) 2 MG/ML IV SOLN: 2.0000 mg | INTRAVENOUS | Status: DC | PRN

## 2021-09-16 MED ORDER — LISINOPRIL 20 MG PO TABS
20.0000 mg | ORAL_TABLET | Freq: Every day | ORAL | Status: DC
  Administered 2021-09-17: 20 mg via ORAL
  Filled 2021-09-16: qty 1

## 2021-09-16 MED ORDER — ASPIRIN EC 81 MG PO TBEC
81.0000 mg | DELAYED_RELEASE_TABLET | Freq: Every day | ORAL | Status: DC
  Administered 2021-09-17: 81 mg via ORAL
  Filled 2021-09-16: qty 1

## 2021-09-16 MED ORDER — PHENYLEPHRINE HCL-NACL 20-0.9 MG/250ML-% IV SOLN
INTRAVENOUS | Status: DC | PRN
  Administered 2021-09-16: 25 ug/min via INTRAVENOUS

## 2021-09-16 MED ORDER — TOPIRAMATE 25 MG PO TABS
50.0000 mg | ORAL_TABLET | Freq: Two times a day (BID) | ORAL | Status: DC
  Administered 2021-09-16 – 2021-09-17 (×2): 50 mg via ORAL
  Filled 2021-09-16 (×2): qty 2

## 2021-09-16 MED ORDER — METOPROLOL SUCCINATE ER 25 MG PO TB24
25.0000 mg | ORAL_TABLET | Freq: Every evening | ORAL | Status: DC
  Administered 2021-09-16: 25 mg via ORAL
  Filled 2021-09-16: qty 1

## 2021-09-16 MED ORDER — PROPOFOL 10 MG/ML IV BOLUS
INTRAVENOUS | Status: DC | PRN
  Administered 2021-09-16: 150 mg via INTRAVENOUS

## 2021-09-16 MED ORDER — GLIPIZIDE 5 MG PO TABS
10.0000 mg | ORAL_TABLET | Freq: Two times a day (BID) | ORAL | Status: DC
  Administered 2021-09-16 – 2021-09-17 (×2): 10 mg via ORAL
  Filled 2021-09-16 (×2): qty 2

## 2021-09-16 MED ORDER — THROMBIN 5000 UNITS EX SOLR
OROMUCOSAL | Status: DC | PRN
  Administered 2021-09-16: 5 mL via TOPICAL

## 2021-09-16 MED ORDER — GABAPENTIN 300 MG PO CAPS: 300.0000 mg | ORAL_CAPSULE | ORAL | Status: AC

## 2021-09-16 MED ORDER — POTASSIUM CHLORIDE IN NACL 20-0.9 MEQ/L-% IV SOLN: INTRAVENOUS | Status: DC

## 2021-09-16 MED ORDER — LIDOCAINE 2% (20 MG/ML) 5 ML SYRINGE
INTRAMUSCULAR | Status: DC | PRN
  Administered 2021-09-16: 60 mg via INTRAVENOUS

## 2021-09-16 MED ORDER — LIDOCAINE 2% (20 MG/ML) 5 ML SYRINGE
INTRAMUSCULAR | Status: AC
  Filled 2021-09-16: qty 5

## 2021-09-16 MED ORDER — THROMBIN 20000 UNITS EX SOLR
CUTANEOUS | Status: DC | PRN
  Administered 2021-09-16: 20 mL via TOPICAL

## 2021-09-16 MED ORDER — EPHEDRINE 5 MG/ML INJ
INTRAVENOUS | Status: AC
  Filled 2021-09-16: qty 5

## 2021-09-16 MED ORDER — ACETAMINOPHEN 500 MG PO TABS: 1000.0000 mg | ORAL_TABLET | ORAL | Status: AC

## 2021-09-16 MED ORDER — BUPIVACAINE HCL (PF) 0.25 % IJ SOLN
INTRAMUSCULAR | Status: DC | PRN
  Administered 2021-09-16: 9 mL

## 2021-09-16 MED ORDER — ROCURONIUM BROMIDE 10 MG/ML (PF) SYRINGE
PREFILLED_SYRINGE | INTRAVENOUS | Status: AC
  Filled 2021-09-16: qty 10

## 2021-09-16 MED ORDER — SODIUM CHLORIDE 0.9% FLUSH
3.0000 mL | Freq: Two times a day (BID) | INTRAVENOUS | Status: DC
  Administered 2021-09-16: 3 mL via INTRAVENOUS

## 2021-09-16 MED ORDER — INSULIN ASPART 100 UNIT/ML IJ SOLN
0.0000 [IU] | Freq: Three times a day (TID) | INTRAMUSCULAR | Status: DC
  Administered 2021-09-16: 3 [IU] via SUBCUTANEOUS

## 2021-09-16 MED ORDER — VANCOMYCIN HCL IN DEXTROSE 1-5 GM/200ML-% IV SOLN
1000.0000 mg | INTRAVENOUS | Status: AC
  Administered 2021-09-16: 1000 mg via INTRAVENOUS

## 2021-09-16 MED ORDER — ONDANSETRON HCL 4 MG/2ML IJ SOLN: 4.0000 mg | Freq: Four times a day (QID) | INTRAMUSCULAR | Status: DC | PRN

## 2021-09-16 MED ORDER — ACETAMINOPHEN 500 MG PO TABS
ORAL_TABLET | ORAL | Status: AC
  Administered 2021-09-16: 1000 mg via ORAL
  Filled 2021-09-16: qty 2

## 2021-09-16 MED ORDER — METHOCARBAMOL 1000 MG/10ML IJ SOLN
500.0000 mg | Freq: Four times a day (QID) | INTRAVENOUS | Status: DC | PRN
  Filled 2021-09-16: qty 5

## 2021-09-16 MED ORDER — METOPROLOL SUCCINATE ER 25 MG PO TB24
ORAL_TABLET | ORAL | Status: AC
  Filled 2021-09-16: qty 1

## 2021-09-16 MED ORDER — ALBUTEROL SULFATE HFA 108 (90 BASE) MCG/ACT IN AERS: 2.0000 | INHALATION_SPRAY | Freq: Four times a day (QID) | RESPIRATORY_TRACT | Status: DC | PRN

## 2021-09-16 MED ORDER — BUSPIRONE HCL 15 MG PO TABS
15.0000 mg | ORAL_TABLET | Freq: Every day | ORAL | Status: DC
  Administered 2021-09-17: 15 mg via ORAL
  Filled 2021-09-16: qty 1

## 2021-09-16 MED ORDER — BUSPIRONE HCL 15 MG PO TABS
30.0000 mg | ORAL_TABLET | Freq: Every day | ORAL | Status: DC
  Administered 2021-09-16: 30 mg via ORAL
  Filled 2021-09-16 (×2): qty 2

## 2021-09-16 MED ORDER — PHENYLEPHRINE 40 MCG/ML (10ML) SYRINGE FOR IV PUSH (FOR BLOOD PRESSURE SUPPORT)
PREFILLED_SYRINGE | INTRAVENOUS | Status: AC
  Filled 2021-09-16: qty 10

## 2021-09-16 MED ORDER — MENTHOL 3 MG MT LOZG: 1.0000 | LOZENGE | OROMUCOSAL | Status: DC | PRN

## 2021-09-16 MED ORDER — FENTANYL CITRATE (PF) 250 MCG/5ML IJ SOLN
INTRAMUSCULAR | Status: AC
  Filled 2021-09-16: qty 5

## 2021-09-16 MED ORDER — VANCOMYCIN HCL IN DEXTROSE 1-5 GM/200ML-% IV SOLN
INTRAVENOUS | Status: AC
  Filled 2021-09-16: qty 200

## 2021-09-16 MED ORDER — 0.9 % SODIUM CHLORIDE (POUR BTL) OPTIME
TOPICAL | Status: DC | PRN
  Administered 2021-09-16: 1000 mL

## 2021-09-16 MED ORDER — ACETAMINOPHEN 500 MG PO TABS
1000.0000 mg | ORAL_TABLET | Freq: Four times a day (QID) | ORAL | Status: DC
Start: 2021-09-16 — End: 2021-09-17
  Administered 2021-09-16 – 2021-09-17 (×3): 1000 mg via ORAL
  Filled 2021-09-16 (×3): qty 2

## 2021-09-16 MED ORDER — VANCOMYCIN HCL 1500 MG/300ML IV SOLN
1500.0000 mg | Freq: Once | INTRAVENOUS | Status: AC
  Administered 2021-09-16: 1500 mg via INTRAVENOUS
  Filled 2021-09-16: qty 300

## 2021-09-16 MED ORDER — ONDANSETRON HCL 4 MG/2ML IJ SOLN
INTRAMUSCULAR | Status: AC
  Filled 2021-09-16: qty 2

## 2021-09-16 MED ORDER — PHENYTOIN SODIUM EXTENDED 100 MG PO CAPS
100.0000 mg | ORAL_CAPSULE | Freq: Three times a day (TID) | ORAL | Status: DC
  Administered 2021-09-16 – 2021-09-17 (×3): 100 mg via ORAL
  Filled 2021-09-16 (×5): qty 1

## 2021-09-16 MED ORDER — VENLAFAXINE HCL ER 75 MG PO CP24
150.0000 mg | ORAL_CAPSULE | Freq: Every day | ORAL | Status: DC
  Administered 2021-09-16: 150 mg via ORAL
  Filled 2021-09-16: qty 2

## 2021-09-16 MED ORDER — LEVOTHYROXINE SODIUM 75 MCG PO TABS
225.0000 ug | ORAL_TABLET | Freq: Every day | ORAL | Status: DC
  Administered 2021-09-17: 225 ug via ORAL
  Filled 2021-09-16: qty 3

## 2021-09-16 MED ORDER — ROCURONIUM BROMIDE 10 MG/ML (PF) SYRINGE
PREFILLED_SYRINGE | INTRAVENOUS | Status: DC | PRN
  Administered 2021-09-16: 30 mg via INTRAVENOUS
  Administered 2021-09-16: 60 mg via INTRAVENOUS
  Administered 2021-09-16: 20 mg via INTRAVENOUS
  Administered 2021-09-16: 10 mg via INTRAVENOUS

## 2021-09-16 MED ORDER — SENNA 8.6 MG PO TABS
1.0000 | ORAL_TABLET | Freq: Two times a day (BID) | ORAL | Status: DC
  Administered 2021-09-16 – 2021-09-17 (×2): 8.6 mg via ORAL
  Filled 2021-09-16 (×2): qty 1

## 2021-09-16 MED ORDER — HYDROMORPHONE HCL 1 MG/ML IJ SOLN
INTRAMUSCULAR | Status: AC
  Filled 2021-09-16: qty 0.5

## 2021-09-16 SURGICAL SUPPLY — 57 items
BAG COUNTER SPONGE SURGICOUNT (BAG) ×2 IMPLANT
BASKET BONE COLLECTION (BASKET) ×2 IMPLANT
BENZOIN TINCTURE PRP APPL 2/3 (GAUZE/BANDAGES/DRESSINGS) ×2 IMPLANT
BLADE CLIPPER SURG (BLADE) IMPLANT
BONE MATRIX OSTEOCEL PRO MED (Bone Implant) ×2 IMPLANT
BUR CARBIDE MATCH 3.0 (BURR) ×2 IMPLANT
CAGE COROENT MP 8X23 (Cage) ×4 IMPLANT
CANISTER SUCT 3000ML PPV (MISCELLANEOUS) ×2 IMPLANT
CAP RELINE MOD TULIP RMM (Cap) ×4 IMPLANT
CLIP GRAFTMAG DISP 742X3.25 (NEUROSURGERY SUPPLIES) ×2 IMPLANT
CNTNR URN SCR LID CUP LEK RST (MISCELLANEOUS) ×1 IMPLANT
CONT SPEC 4OZ STRL OR WHT (MISCELLANEOUS) ×1
COVER BACK TABLE 60X90IN (DRAPES) ×2 IMPLANT
DERMABOND ADVANCED (GAUZE/BANDAGES/DRESSINGS) ×1
DERMABOND ADVANCED .7 DNX12 (GAUZE/BANDAGES/DRESSINGS) ×1 IMPLANT
DRAPE C-ARM 42X72 X-RAY (DRAPES) ×4 IMPLANT
DRAPE LAPAROTOMY 100X72X124 (DRAPES) ×2 IMPLANT
DRAPE SURG 17X23 STRL (DRAPES) ×2 IMPLANT
DRSG OPSITE POSTOP 4X6 (GAUZE/BANDAGES/DRESSINGS) ×2 IMPLANT
DURAPREP 26ML APPLICATOR (WOUND CARE) ×2 IMPLANT
ELECT REM PT RETURN 9FT ADLT (ELECTROSURGICAL) ×2
ELECTRODE REM PT RTRN 9FT ADLT (ELECTROSURGICAL) ×1 IMPLANT
EVACUATOR 1/8 PVC DRAIN (DRAIN) ×2 IMPLANT
GAUZE 4X4 16PLY ~~LOC~~+RFID DBL (SPONGE) ×2 IMPLANT
GLOVE SURG ENC MOIS LTX SZ7 (GLOVE) ×2 IMPLANT
GLOVE SURG ENC MOIS LTX SZ8 (GLOVE) ×4 IMPLANT
GLOVE SURG POLYISO LF SZ7 (GLOVE) ×6 IMPLANT
GLOVE SURG UNDER POLY LF SZ7 (GLOVE) ×2 IMPLANT
GLOVE SURG UNDER POLY LF SZ7.5 (GLOVE) ×8 IMPLANT
GOWN STRL REUS W/ TWL LRG LVL3 (GOWN DISPOSABLE) IMPLANT
GOWN STRL REUS W/ TWL XL LVL3 (GOWN DISPOSABLE) ×3 IMPLANT
GOWN STRL REUS W/TWL 2XL LVL3 (GOWN DISPOSABLE) IMPLANT
GOWN STRL REUS W/TWL LRG LVL3 (GOWN DISPOSABLE)
GOWN STRL REUS W/TWL XL LVL3 (GOWN DISPOSABLE) ×3
HEMOSTAT POWDER KIT SURGIFOAM (HEMOSTASIS) ×2 IMPLANT
KIT BASIN OR (CUSTOM PROCEDURE TRAY) ×2 IMPLANT
KIT BONE MRW ASP ANGEL CPRP (KITS) IMPLANT
KIT TURNOVER KIT B (KITS) ×2 IMPLANT
MILL MEDIUM DISP (BLADE) ×2 IMPLANT
NEEDLE HYPO 25X1 1.5 SAFETY (NEEDLE) ×2 IMPLANT
NS IRRIG 1000ML POUR BTL (IV SOLUTION) ×2 IMPLANT
PACK LAMINECTOMY NEURO (CUSTOM PROCEDURE TRAY) ×2 IMPLANT
PAD ARMBOARD 7.5X6 YLW CONV (MISCELLANEOUS) ×6 IMPLANT
ROD COCR RELINE LORD 5X85 (Rod) ×4 IMPLANT
SCREW LOCK RSS 4.5/5.0MM (Screw) ×12 IMPLANT
SHANK RELINE MOD 5.5X40 (Screw) ×4 IMPLANT
SPONGE SURGIFOAM ABS GEL 100 (HEMOSTASIS) ×2 IMPLANT
SPONGE T-LAP 4X18 ~~LOC~~+RFID (SPONGE) IMPLANT
STRIP CLOSURE SKIN 1/2X4 (GAUZE/BANDAGES/DRESSINGS) ×2 IMPLANT
SUT VIC AB 0 CT1 18XCR BRD8 (SUTURE) ×1 IMPLANT
SUT VIC AB 0 CT1 8-18 (SUTURE) ×1
SUT VIC AB 2-0 CP2 18 (SUTURE) ×4 IMPLANT
SUT VIC AB 3-0 SH 8-18 (SUTURE) ×4 IMPLANT
TOWEL GREEN STERILE (TOWEL DISPOSABLE) ×2 IMPLANT
TOWEL GREEN STERILE FF (TOWEL DISPOSABLE) ×2 IMPLANT
TRAY FOLEY MTR SLVR 16FR STAT (SET/KITS/TRAYS/PACK) ×2 IMPLANT
WATER STERILE IRR 1000ML POUR (IV SOLUTION) ×2 IMPLANT

## 2021-09-16 NOTE — Progress Notes (Signed)
Medication list gone over with patient and pt's husband, neither are sure of last medication doses as their pharmacy packages everything and they take as directed. Pt did not have any of her medications this morning, no medication taken last night other than insulin dose.

## 2021-09-16 NOTE — Anesthesia Procedure Notes (Addendum)
Procedure Name: Intubation Date/Time: 09/16/2021 9:51 AM Performed by: Asher Muir, CRNA Pre-anesthesia Checklist: Patient identified, Patient being monitored, Timeout performed, Emergency Drugs available and Suction available Patient Re-evaluated:Patient Re-evaluated prior to induction Oxygen Delivery Method: Circle System Utilized Preoxygenation: Pre-oxygenation with 100% oxygen Induction Type: IV induction Ventilation: Mask ventilation without difficulty and Oral airway inserted - appropriate to patient size Laryngoscope Size: Mac and 3 Grade View: Grade II Tube type: Oral Tube size: 7.0 mm Number of attempts: 2 Airway Equipment and Method: stylet Placement Confirmation: ETT inserted through vocal cords under direct vision, positive ETCO2 and breath sounds checked- equal and bilateral Secured at: 21 cm Tube secured with: Tape Dental Injury: Teeth and Oropharynx as per pre-operative assessment

## 2021-09-16 NOTE — Anesthesia Postprocedure Evaluation (Signed)
Anesthesia Post Note  Patient: Jenna Butler  Procedure(s) Performed: Posterior Lumbar Two-Three Interbody Fusion with extension of hardware (Back)     Patient location during evaluation: PACU Anesthesia Type: General Level of consciousness: awake and alert Pain management: pain level controlled Vital Signs Assessment: post-procedure vital signs reviewed and stable Respiratory status: spontaneous breathing, nonlabored ventilation, respiratory function stable and patient connected to nasal cannula oxygen Cardiovascular status: blood pressure returned to baseline and stable Postop Assessment: no apparent nausea or vomiting Anesthetic complications: no   No notable events documented.  Last Vitals:  Vitals:   09/16/21 1340 09/16/21 1416  BP: 127/71 128/63  Pulse: 89 99  Resp: 13 18  Temp: 36.7 C (!) 36.4 C  SpO2: 94% 99%    Last Pain:  Vitals:   09/16/21 1416  TempSrc: Oral  PainSc: 4                  Jenna Butler

## 2021-09-16 NOTE — Op Note (Signed)
09/16/2021  12:50 PM  PATIENT:  Jenna Butler  70 y.o. 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 Decompressive lumbar laminectomy, hemi facetectomy and foraminotomies L2-3 requiring more work than would be required for a simple exposure of the disk for PLIF in order to adequately decompress the neural elements and address the spinal stenosis 2. Posterior lumbar interbody fusion L2-3 using peek interbody cages packed with morcellized allograft and autograft  3. Posterior fixation L2-L4 using NuVasive cortical pedicle screws.  4. Intertransverse arthrodesis L2-3 using morcellized autograft and allograft. 5.  Exploration of fusion L3-S1 with removal of segmental instrumentation L3-S1  SURGEON:  Marikay Alar, MD  ASSISTANTS: Verlin Dike FNP  ANESTHESIA:  General  EBL: 300 ml  Total I/O In: 1250 [I.V.:1000; IV Piggyback:250] Out: 575 [Urine:275; Blood:300]  BLOOD ADMINISTERED:none  DRAINS: none   INDICATION FOR PROCEDURE: This patient presented with severe back pain with bilateral leg pain with leg weakness. Imaging revealed severe adjacent level stenosis L2-3 above previous L3-S1 fusion. The patient tried a reasonable attempt at conservative medical measures without relief. I recommended decompression and instrumented fusion to address the stenosis as well as the segmental  instability.  Patient understood the risks, benefits, and alternatives and potential outcomes and wished to proceed.  PROCEDURE DETAILS:  The patient was brought to the operating room. After induction of generalized endotracheal anesthesia the patient was rolled into the prone position on chest rolls and all pressure points were padded. The patient's lumbar region was cleaned and then prepped with DuraPrep and draped in the usual sterile fashion. Anesthesia was  injected and then a dorsal midline incision was made and carried down to the lumbosacral fascia. The fascia was opened and the paraspinous musculature was taken down in a subperiosteal fashion to expose L2-3 as well as the previously placed instrumentation. A self-retaining retractor was placed.  I started with removal of the instrumentation and exploration of the fusion.  The locking caps were removed at L3-S1 and the rods were removed.  I then pulled on each screw successively and all screws moved in unison suggesting a solid segment.  Intraoperative fluoroscopy confirmed my level, and I started with placement of the L2 cortical pedicle screws. The pedicle screw entry zones were identified utilizing surface landmarks and  AP and lateral fluoroscopy. I scored the cortex with the high-speed drill and then used the hand drill to drill an upward and outward direction into the pedicle. I then tapped line to line. I then placed a 8.5 x 40 mm cortical pedicle screw into the pedicles of L2 bilaterally.    I then turned my attention to the decompression and complete lumbar laminectomies, hemi- facetectomies, and foraminotomies were performed at L2-3.  My nurse practitioner was directly involved in the decompression and exposure of the neural elements. the patient had significant spinal stenosis and this required more work than would be required for a simple exposure of the disc for posterior lumbar interbody fusion which would only require a limited laminotomy. Much more generous decompression and generous foraminotomy was undertaken in order to adequately decompress the neural elements and address the patient's leg pain. The yellow ligament was removed to expose the underlying dura and nerve roots, and generous foraminotomies were performed to adequately decompress the neural elements. Both the exiting and traversing nerve roots were decompressed on both sides until a coronary  dilator passed easily along the nerve  roots. Once the decompression was complete, I turned my attention to the posterior lower lumbar interbody fusion. The epidural venous vasculature was coagulated and cut sharply. Disc space was incised and the initial discectomy was performed with pituitary rongeurs. The disc space was distracted with sequential distractors to a height of 8 mm. We then used a series of scrapers and shavers to prepare the endplates for fusion. The midline was prepared with Epstein curettes. Once the complete discectomy was finished, we packed an appropriate sized interbody cage with local autograft and morcellized allograft, gently retracted the nerve root, and tapped the cage into position at L2-3.  The midline between the cages was packed with morselized autograft and allograft.    We then decorticated the transverse processes and laid a mixture of morcellized autograft and allograft out over these to perform intertransverse arthrodesis at L2-3. We then placed lordotic rods into the multiaxial screw heads of the pedicle screws L2-L4 and locked these in position with the locking caps and anti-torque device.  We left the L5 and S1 pedicle screws in place in case they were ever needed.  We then checked our construct with AP and lateral fluoroscopy. Irrigated with copious amounts of bacitracin-containing saline solution. Inspected the nerve roots once again to assure adequate decompression, lined to the dura with Gelfoam,  and then we closed the muscle and the fascia with 0 Vicryl. Closed the subcutaneous tissues with 2-0 Vicryl and subcuticular tissues with 3-0 Vicryl. The skin was closed with benzoin and Steri-Strips. Dressing was then applied, the patient was awakened from general anesthesia and transported to the recovery room in stable condition. At the end of the procedure all sponge, needle and instrument counts were correct.   PLAN OF CARE: admit to inpatient  PATIENT DISPOSITION:  PACU - hemodynamically stable.   Delay  start of Pharmacological VTE agent (>24hrs) due to surgical blood loss or risk of bleeding:  yes

## 2021-09-16 NOTE — H&P (Signed)
Subjective: Patient is a 70 y.o. female admitted for plif. Onset of symptoms was several months ago, gradually worsening since that time.  The pain is rated severe, and is located at the across the lower back and radiates to legs. The pain is described as aching and occurs all day. The symptoms have been progressive. Symptoms are exacerbated by exercise and standing. 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   Past Medical History:  Diagnosis Date   Anemia    Anxiety    Arthritis    Asthma    COPD (chronic obstructive pulmonary disease) (HCC)    Coronary artery disease    Depression    Diabetes mellitus without complication (HCC)    Dyspnea    Dysrhythmia    Family history of adverse reaction to anesthesia    mother woke up during surgery   Fibromyalgia    Headache    tension headaches and migraines   Hypertension    Hypothyroidism    Multiple lung nodules on CT    CT chest 09/27/20 ([facility]): Stable appearance of many small pulmonary nodules, followed by pulmonology   Neuropathy    both hands, arms, feet and legs   Restless legs    Seizures (HCC)    small, focal type seizures, on Dilantin and Lamictal   Sleep apnea    uses CPAP, haven't used CPAP in a while, getting a new one    Past Surgical History:  Procedure Laterality Date   ABDOMINAL HYSTERECTOMY  2000   ANTERIOR CERVICAL DECOMP/DISCECTOMY FUSION N/A 07/24/2021   Procedure: CERVICAL FIVE-SIX ANTERIOR CERVICAL DECOMPRESSION/DISCECTOMY FUSION;  Surgeon: Tia Alert, MD;  Location: [facility] OR;  Service: Neurosurgery;  Laterality: N/A;   APPENDECTOMY  1972   BACK SURGERY     CARDIAC CATHETERIZATION  2005   1 stent [facility]   CHOLECYSTECTOMY  1974   CORONARY ANGIOPLASTY     EYE SURGERY     HAND SURGERY     Plastic surgery   LUMBAR LAMINECTOMY/DECOMPRESSION MICRODISCECTOMY Left 11/03/2014   Procedure: Left Lumbar three/four Intra/Extraformainal Microdiskectomy ;  Surgeon: Tia Alert, MD;  Location: [facility]  NEURO ORS;  Service: Neurosurgery;  Laterality: Left;   SHOULDER SURGERY     TONSILLECTOMY     TRANSFORAMINAL LUMBAR INTERBODY FUSION (TLIF) WITH PEDICLE SCREW FIXATION 3 LEVEL N/A 11/27/2016   Procedure: Lumbar three-four, Lumbar four-five, Lumbar five-Sacral one Posterior Lumbar Interbody Fusion;  Surgeon: Tia Alert, MD;  Location: [facility] OR;  Service: Neurosurgery;  Laterality: N/A;   TUBAL LIGATION      Prior to Admission medications   Medication Sig Start Date End Date Taking? Authorizing Provider  aspirin EC 81 MG tablet Take 81 mg by mouth daily.   Yes [provider]  busPIRone (BUSPAR) 15 MG tablet Take 15-30 mg by mouth See admin instructions. Take 15 mg by mouth in the morning and 30 mg in the evening.   Yes [provider]  CALCIUM PO Take 1 tablet by mouth daily.   Yes [provider]  cetirizine (ZYRTEC) 10 MG tablet Take 10 mg by mouth daily.   Yes [provider]  doxycycline (VIBRAMYCIN) 100 MG capsule Take 1 capsule (100 mg total) by mouth 2 (two) times daily. 07/16/21  Yes Eustace Moore, MD  furosemide (LASIX) 20 MG tablet Take 1 tablet (20 mg total) by mouth daily. Has not had Rx recently--would like a refill 07/16/21  Yes Eustace Moore, MD  gabapentin (  NEURONTIN) 300 MG capsule Take 600 mg by mouth 4 (four) times daily as needed (pain).   Yes [provider]  Garlic 1200 MG CAPS Take 1,200 mg by mouth daily.   Yes [provider]  gemfibrozil (LOPID) 600 MG tablet Take 600 mg by mouth 2 (two) times daily before a meal.   Yes [provider]  glipiZIDE (GLUCOTROL) 10 MG tablet Take 10 mg by mouth 2 (two) times daily before a meal.   Yes [provider]  glucose 4 GM chewable tablet Chew 1 tablet by mouth as needed for low blood sugar.   Yes [provider]  insulin glargine (LANTUS) 100 UNIT/ML injection Inject 6 Units into the skin at bedtime.   Yes [provider]   levothyroxine (SYNTHROID) 200 MCG tablet Take 1 tablet by mouth daily. 08/08/21  Yes [provider]  levothyroxine (SYNTHROID) 25 MCG tablet Take 25 mcg by mouth daily. 05/15/21  Yes [provider]  levothyroxine (SYNTHROID, LEVOTHROID) 200 MCG tablet Take 200 mcg by mouth daily before breakfast.   Yes [provider]  lisinopril (PRINIVIL,ZESTRIL) 20 MG tablet Take 20 mg by mouth daily.   Yes [provider]  meloxicam (MOBIC) 7.5 MG tablet Take 7.5 mg by mouth daily.   Yes [provider]  metoprolol succinate (TOPROL-XL) 25 MG 24 hr tablet Take 25 mg by mouth every evening. 08/26/16  Yes [provider]  montelukast (SINGULAIR) 10 MG tablet Take 10 mg by mouth daily.   Yes [provider]  oxyCODONE (OXY IR/ROXICODONE) 5 MG immediate release tablet Take 1 tablet (5 mg total) by mouth every 6 (six) hours as needed for moderate pain ((score 4 to 6)). 07/25/21  Yes Tia Alert, MD  triamterene-hydrochlorothiazide (MAXZIDE-25) 37.5-25 MG tablet Take 1 tablet by mouth daily.   Yes [provider]  venlafaxine XR (EFFEXOR-XR) 150 MG 24 hr capsule Take 150 mg by mouth daily after lunch. 09/15/16  Yes [provider]  Acetaminophen (TYLENOL 8 HOUR PO) Take 1-2 tablets by mouth daily as needed (pain and headache).    [provider]  albuterol (PROVENTIL HFA;VENTOLIN HFA) 108 (90 BASE) MCG/ACT inhaler Inhale 2 puffs into the lungs every 6 (six) hours as needed for wheezing or shortness of breath.    [provider]  clonazePAM (KLONOPIN) 0.5 MG tablet Take 0.5 mg by mouth daily as needed for anxiety.    [provider]  lamoTRIgine (LAMICTAL) 200 MG tablet Take 200 mg by mouth 2 (two) times daily.    [provider]  meloxicam (MOBIC) 7.5 MG tablet Take 1 tablet by mouth daily. 07/30/21   [provider]  phenytoin (DILANTIN) 100 MG ER capsule Take 100 mg by mouth 3 (three) times  daily.    [provider]  topiramate (TOPAMAX) 50 MG tablet Take 50 mg by mouth 2 (two) times daily.    [provider]  traZODone (DESYREL) 100 MG tablet Take 100 mg by mouth at bedtime as needed for sleep.    [provider]  venlafaxine XR (EFFEXOR-XR) 37.5 MG 24 hr capsule Take 37.5 mg by mouth daily with breakfast. 09/15/16   [provider]   Allergies  Allergen Reactions   Amoxicillin Shortness Of Breath and Rash   Ceclor [Cefaclor] Shortness Of Breath, Itching, Swelling and Rash   Lyrica [Pregabalin] Other (See Comments)    "causes me to be very depressed."   Tape Dermatitis    "tears  my skin."    Social History   Tobacco Use   Smoking status: Former    Packs/day: 2.00    Years: 30.00    Pack years: 60.00    Types: Cigarettes    Quit date: 10/30/1997    Years since quitting: 23.8   Smokeless tobacco: Never  Substance Use Topics   Alcohol use: No    Family History  Problem Relation Age of Onset   Heart attack Mother    Hypertension Mother    Alzheimer's disease Mother      Review of Systems  Positive ROS: neg  All other systems have been reviewed and were otherwise negative with the exception of those mentioned in the HPI and as above.  Objective: Vital signs in last 24 hours: Temp:  [98.3 F (36.8 C)] 98.3 F (36.8 C) (09/19 0813) Pulse Rate:  [85] 85 (09/19 0813) Resp:  [18] 18 (09/19 0813) BP: (132)/(56) 132/56 (09/19 0813) SpO2:  [100 %] 100 % (09/19 0813) Weight:  [637 kg] 112 kg (09/19 0813)  General Appearance: Alert, cooperative, no distress, appears stated age Head: Normocephalic, without obvious abnormality, atraumatic Eyes: PERRL, conjunctiva/corneas clear, EOM's intact    Neck: Supple, symmetrical, trachea midline Back: Symmetric, no curvature, ROM normal, no CVA tenderness Lungs:  respirations unlabored Heart: Regular rate and rhythm Abdomen: Soft, non-tender Extremities: Extremities normal, atraumatic,  no cyanosis or edema Pulses: 2+ and symmetric all extremities Skin: Skin color, texture, turgor normal, no rashes or lesions  NEUROLOGIC:   Mental status: Alert and oriented x4,  no aphasia, good attention span, fund of knowledge, and memory Motor Exam - grossly normal Sensory Exam - grossly normal Reflexes: 1= Coordination - grossly normal Gait - grossly normal Balance - grossly normal Cranial Nerves: I: smell Not tested  II: visual acuity  OS: nl    OD: nl  II: visual fields Full to confrontation  II: pupils Equal, round, reactive to light  III,VII: ptosis None  III,IV,VI: extraocular muscles  Full ROM  V: mastication Normal  V: facial light touch sensation  Normal  V,VII: corneal reflex  Present  VII: facial muscle function - upper  Normal  VII: facial muscle function - lower Normal  VIII: hearing Not tested  IX: soft palate elevation  Normal  IX,X: gag reflex Present  XI: trapezius strength  5/5  XI: sternocleidomastoid strength 5/5  XI: neck flexion strength  5/5  XII: tongue strength  Normal    Data Review Lab Results  Component Value Date   WBC 6.0 09/12/2021   HGB 11.3 (L) 09/12/2021   HCT 35.0 (L) 09/12/2021   MCV 91.6 09/12/2021   PLT 214 09/12/2021   Lab Results  Component Value Date   NA 140 09/12/2021   K 3.7 09/12/2021   CL 104 09/12/2021   CO2 27 09/12/2021   BUN 35 (H) 09/12/2021   CREATININE 1.27 (H) 09/12/2021   GLUCOSE 155 (H) 09/12/2021   Lab Results  Component Value Date   INR 1.0 09/12/2021    Assessment/Plan:  Estimated body mass index is 39.87 kg/m as calculated from the following:   Height as of this encounter: 5\' 6"  (1.676 m).   Weight as of this encounter: 112 kg. Patient admitted for PLIF L2-3. Patient has failed a reasonable attempt at conservative therapy.  I explained the condition and procedure to the patient and answered any questions.  Patient wishes to proceed with procedure as planned. Understands risks/ benefits  and typical outcomes of  procedure.   Tia Alert 09/16/2021 9:30 AM

## 2021-09-16 NOTE — Transfer of Care (Signed)
Immediate Anesthesia Transfer of Care Note  Patient: Jenna Butler  Procedure(s) Performed: Posterior Lumbar Two-Three Interbody Fusion with extension of hardware (Back)  Patient Location: PACU  Anesthesia Type:General  Level of Consciousness: awake, alert  and oriented  Airway & Oxygen Therapy: Patient Spontanous Breathing and Patient connected to nasal cannula oxygen  Post-op Assessment: Report given to RN and Post -op Vital signs reviewed and stable  Post vital signs: Reviewed and stable  Last Vitals:  Vitals Value Taken Time  BP 131/61 09/16/21 1255  Temp 36.7 C 09/16/21 1258  Pulse 88 09/16/21 1259  Resp 27 09/16/21 1259  SpO2 100 % 09/16/21 1259  Vitals shown include unvalidated device data.  Last Pain:  Vitals:   09/16/21 0833  TempSrc:   PainSc: 4       Patients Stated Pain Goal: 3 (09/16/21 2423)  Complications: No notable events documented.

## 2021-09-17 DIAGNOSIS — Z20822 Contact with and (suspected) exposure to covid-19: Secondary | ICD-10-CM | POA: Diagnosis not present

## 2021-09-17 LAB — GLUCOSE, CAPILLARY
Glucose-Capillary: 167 mg/dL — ABNORMAL HIGH (ref 70–99)
Glucose-Capillary: 188 mg/dL — ABNORMAL HIGH (ref 70–99)

## 2021-09-17 LAB — CBC
HCT: 32.3 % — ABNORMAL LOW (ref 36.0–46.0)
Hemoglobin: 10.6 g/dL — ABNORMAL LOW (ref 12.0–15.0)
MCH: 29.9 pg (ref 26.0–34.0)
MCHC: 32.8 g/dL (ref 30.0–36.0)
MCV: 91.2 fL (ref 80.0–100.0)
Platelets: 261 10*3/uL (ref 150–400)
RBC: 3.54 MIL/uL — ABNORMAL LOW (ref 3.87–5.11)
RDW: 14.5 % (ref 11.5–15.5)
WBC: 11.5 10*3/uL — ABNORMAL HIGH (ref 4.0–10.5)
nRBC: 0 % (ref 0.0–0.2)

## 2021-09-17 MED ORDER — METHOCARBAMOL 500 MG PO TABS: 500.0000 mg | ORAL_TABLET | Freq: Four times a day (QID) | ORAL | 0 refills | Status: AC

## 2021-09-17 NOTE — Evaluation (Addendum)
Occupational Therapy Evaluation Patient Details Name: Jenna Butler MRN: [id number] DOB: December 12, 1951 Today's Date: 09/17/2021   History of Present Illness Patient is a 70 year old female who presents s/p L2-L3 PLIF on 09/16/2021. PMH significant for ACDF with plating 07/24/21, prior shoulder surgery, multiple back surgeries, cardiac cath, DM   Clinical Impression   PTA, pt was living with her husband and was independent with ADLs and using rollator for mobility. Currently, pt requires Supervision-Min Guard A for ADLs and functional mobility using RW. Provided education and handout on back precautions, brace management, bed mobility, LB ADLs, toileting, and shower transfer; pt demonstrated understanding. Answered all pt questions. Recommend dc home once medically stable per physician. All acute OT needs met and will sign off. Thank you.      Recommendations for follow up therapy are one component of a multi-disciplinary discharge planning process, led by the attending physician.  Recommendations may be updated based on patient status, additional functional criteria and insurance authorization.   Follow Up Recommendations  No OT follow up    Equipment Recommendations  None recommended by OT    Recommendations for Other Services PT consult     Precautions / Restrictions Precautions Precautions: Back;Fall Precaution Booklet Issued: Yes (comment) Precaution Comments: Reviewed handout and pt was cued for precautions during functional mobility. Required Braces or Orthoses: Spinal Brace Spinal Brace: Lumbar corset;Applied in sitting position Restrictions Weight Bearing Restrictions: No      Mobility Bed Mobility Overal bed mobility: Needs Assistance Bed Mobility: Rolling;Sidelying to Sit;Sit to Sidelying Rolling: Supervision Sidelying to sit: Supervision     Sit to sidelying: Supervision General bed mobility comments: Poor maintenance of precautions and requires step-by-step VC's for  proper technique.    Transfers Overall transfer level: Needs assistance Equipment used: Rolling walker (2 wheeled) Transfers: Sit to/from Stand Sit to Stand: Supervision         General transfer comment: VC's for hand placement on seated surface for safety. No assist to power-up to full stand but close supervision was required for safety.    Balance Overall balance assessment: Modified Independent                                         ADL either performed or assessed with clinical judgement   ADL Overall ADL's : Needs assistance/impaired Eating/Feeding: Set up;Sitting   Grooming: Wash/dry hands;Supervision/safety;Set up;Standing   Upper Body Bathing: Supervision/ safety;Set up;Sitting   Lower Body Bathing: Min guard;Sit to/from stand   Upper Body Dressing : Supervision/safety;Set up;Sitting   Lower Body Dressing: Min guard;Sit to/from stand   Toilet Transfer: Ambulation;Regular Toilet;Grab Financial controller Details (indicate cue type and reason): Supervision for safety         Functional mobility during ADLs: Min guard;Rolling walker General ADL Comments: Providing educating on back precautions and compensatory tehcniques for grooming, LB dressing, toileting, and shower transfer.     Vision         Perception     Praxis      Pertinent Vitals/Pain Pain Assessment: Faces Faces Pain Scale: Hurts little more Pain Location: Back Pain Descriptors / Indicators: Operative site guarding;Sore Pain Intervention(s): Limited activity within patient's tolerance;Monitored during session;Repositioned     Hand Dominance     Extremity/Trunk Assessment Upper Extremity Assessment Upper Extremity Assessment: Defer to OT evaluation   Lower Extremity Assessment Lower Extremity Assessment: Generalized weakness (Consistent with pre-op  diagnosis)   Cervical / Trunk Assessment Cervical / Trunk Assessment: Other exceptions Cervical /  Trunk Exceptions: s/p back sx, forward head posture with rounded shoulders.   Communication Communication Communication: No difficulties   Cognition Arousal/Alertness: Awake/alert Behavior During Therapy: WFL for tasks assessed/performed Overall Cognitive Status: Within Functional Limits for tasks assessed Area of Impairment: Safety/judgement;Awareness;Problem solving;Following commands                       Following Commands: Follows one step commands with increased time Safety/Judgement: Decreased awareness of safety Awareness: Intellectual;Emergent Problem Solving: Slow processing;Requires verbal cues;Difficulty sequencing General Comments: Pt tangential throughout session; requiring cues for redirecting. Slow processing and requiring increased time. Unsure if this is baseline behavior or due to pain medication.   General Comments       Exercises     Shoulder Instructions      Home Living Family/patient expects to be discharged to:: Private residence Living Arrangements: Spouse/significant other Available Help at Discharge: Family;Available PRN/intermittently Type of Home: Mobile home Home Access: Ramped entrance     Home Layout: One level     Bathroom Shower/Tub: Occupational psychologist: Standard     Home Equipment: Environmental consultant - 4 wheels;Shower seat;Grab bars - toilet;Grab bars - tub/shower          Prior Functioning/Environment Level of Independence: Independent with assistive device(s)        Comments: Pt performing ADLs. Reports she uses a rollator        OT Problem List: Pain;Decreased knowledge of precautions      OT Treatment/Interventions:      OT Goals(Current goals can be found in the care plan section) Acute Rehab OT Goals Patient Stated Goal: home today OT Goal Formulation: All assessment and education complete, DC therapy  OT Frequency:     Barriers to D/C:            Co-evaluation              AM-PAC OT "6  Clicks" Daily Activity     Outcome Measure Help from another person eating meals?: A Little Help from another person taking care of personal grooming?: A Little Help from another person toileting, which includes using toliet, bedpan, or urinal?: A Little Help from another person bathing (including washing, rinsing, drying)?: A Little Help from another person to put on and taking off regular upper body clothing?: A Little Help from another person to put on and taking off regular lower body clothing?: A Little 6 Click Score: 18   End of Session Equipment Utilized During Treatment: Rolling walker;Back brace Nurse Communication: Mobility status  Activity Tolerance: Patient tolerated treatment well Patient left: in chair;with call bell/phone within reach  OT Visit Diagnosis: Unsteadiness on feet (R26.81);Other abnormalities of gait and mobility (R26.89);Muscle weakness (generalized) (M62.81);Pain Pain - part of body:  (back)                Time: 1275-1700 OT Time Calculation (min): 27 min Charges:  OT General Charges $OT Visit: 1 Visit OT Evaluation $OT Eval Low Complexity: 1 Low OT Treatments $Self Care/Home Management : 8-22 mins  Jax Abdelrahman MSOT, OTR/L Acute Rehab Pager: [phone] Office: [place] 09/17/2021, 10:07 AM

## 2021-09-17 NOTE — Plan of Care (Signed)

## 2021-09-17 NOTE — Progress Notes (Signed)
Patient was transported via wheelchair by volunteer for discharge home; in no acute distress nor complaints of pain nor discomfort; dressing on her back is clean, dry and intact; room was checked and accounted for all her belongings; discharge instructions given to patient and her husband by RN and they both verbalized understanding on the instructions given.

## 2021-09-17 NOTE — Evaluation (Signed)
Physical Therapy Evaluation Patient Details Name: Jenna Butler MRN: [id number] DOB: July 01, 1951 Today's Date: 09/17/2021  History of Present Illness  Patient is a 70 year old female who presents s/p L2-L3 PLIF on 09/16/2021. PMH significant for ACDF with plating 07/24/21, prior shoulder surgery, multiple back surgeries, cardiac cath, DM   Clinical Impression  Pt admitted with above diagnosis. At the time of PT eval, pt was able to demonstrate transfers and ambulation with gross min guard assist to supervision for safety and RW for support. Pt was educated on precautions, brace application/wearing schedule, appropriate activity progression, and car transfer. Pt currently with functional limitations due to the deficits listed below (see PT Problem List). Pt will benefit from skilled PT to increase their independence and safety with mobility to allow discharge to the venue listed below.         Recommendations for follow up therapy are one component of a multi-disciplinary discharge planning process, led by the attending physician.  Recommendations may be updated based on patient status, additional functional criteria and insurance authorization.  Follow Up Recommendations Outpatient PT;Supervision for mobility/OOB    Equipment Recommendations  None recommended by PT (Pt refused RW - prefers her rollator despite safety concerns.)    Recommendations for Other Services       Precautions / Restrictions Precautions Precautions: Back;Fall Precaution Booklet Issued: Yes (comment) Precaution Comments: Reviewed handout and pt was cued for precautions during functional mobility. Required Braces or Orthoses: Spinal Brace Spinal Brace: Lumbar corset;Applied in sitting position Restrictions Weight Bearing Restrictions: No      Mobility  Bed Mobility               General bed mobility comments: Pt was received sitting up in the recliner. Reviewed log roll technique.    Transfers Overall  transfer level: Needs assistance Equipment used: Rolling walker (2 wheeled) Transfers: Sit to/from Stand Sit to Stand: Supervision;Min guard         General transfer comment: VC's for hand placement on seated surface for safety. No assist to power-up to full stand but close supervision was required for safety. Hands-on guarding provided to control descent to chair.  Ambulation/Gait Ambulation/Gait assistance: Supervision;Min guard Gait Distance (Feet): 200 Feet Assistive device: Rolling walker (2 wheeled) Gait Pattern/deviations: Step-through pattern;Decreased stride length;Trunk flexed Gait velocity: Decreased Gait velocity interpretation: <1.8 ft/sec, indicate of risk for recurrent falls General Gait Details: VC's throughout for improved posture, closer walker proximity, and forward gaze. Poor maintenance of precautions throughout.  Stairs            Wheelchair Mobility    Modified Rankin (Stroke Patients Only)       Balance Overall balance assessment: Mild deficits observed, not formally tested                                           Pertinent Vitals/Pain Pain Assessment: Faces Faces Pain Scale: Hurts little more Pain Location: Back Pain Descriptors / Indicators: Operative site guarding;Sore Pain Intervention(s): Limited activity within patient's tolerance;Monitored during session;Repositioned    Home Living Family/patient expects to be discharged to:: Private residence Living Arrangements: Spouse/significant other Available Help at Discharge: Family;Available PRN/intermittently Type of Home: Mobile home Home Access: Ramped entrance     Home Layout: One level Home Equipment: Walker - 4 wheels;Shower seat;Grab bars - toilet;Grab bars - tub/shower      Prior Function Level of  Independence: Independent with assistive device(s)         Comments: Pt performing ADLs. Reports she uses a rollator     Hand Dominance         Extremity/Trunk Assessment   Upper Extremity Assessment Upper Extremity Assessment: Defer to OT evaluation    Lower Extremity Assessment Lower Extremity Assessment: Generalized weakness (Consistent with pre-op diagnosis)    Cervical / Trunk Assessment Cervical / Trunk Assessment: Other exceptions Cervical / Trunk Exceptions: s/p back sx, forward head posture with rounded shoulders.  Communication   Communication: No difficulties  Cognition Arousal/Alertness: Awake/alert Behavior During Therapy: WFL for tasks assessed/performed Overall Cognitive Status: Within Functional Limits for tasks assessed Area of Impairment: Safety/judgement;Awareness;Problem solving;Following commands                       Following Commands: Follows one step commands with increased time Safety/Judgement: Decreased awareness of safety Awareness: Intellectual;Emergent Problem Solving: Slow processing;Requires verbal cues;Difficulty sequencing General Comments: Pt tangential throughout session; requiring cues for redirecting. Slow processing and requiring increased time. Unsure if this is baseline behavior or due to pain medication.      General Comments      Exercises     Assessment/Plan    PT Assessment Patient needs continued PT services  PT Problem List Decreased strength;Decreased balance;Decreased activity tolerance;Decreased mobility;Decreased knowledge of use of DME;Decreased safety awareness;Decreased knowledge of precautions;Pain       PT Treatment Interventions DME instruction;Gait training;Functional mobility training;Therapeutic activities;Therapeutic exercise;Neuromuscular re-education;Patient/family education    PT Goals (Current goals can be found in the Care Plan section)  Acute Rehab PT Goals Patient Stated Goal: home today PT Goal Formulation: With patient Time For Goal Achievement: 09/24/21 Potential to Achieve Goals: Good    Frequency Min 5X/week   Barriers to  discharge        Co-evaluation               AM-PAC PT "6 Clicks" Mobility  Outcome Measure Help needed turning from your back to your side while in a flat bed without using bedrails?: A Little Help needed moving from lying on your back to sitting on the side of a flat bed without using bedrails?: A Little Help needed moving to and from a bed to a chair (including a wheelchair)?: A Little Help needed standing up from a chair using your arms (e.g., wheelchair or bedside chair)?: A Little Help needed to walk in hospital room?: A Little Help needed climbing 3-5 steps with a railing? : A Little 6 Click Score: 18    End of Session Equipment Utilized During Treatment: Gait belt Activity Tolerance: Patient tolerated treatment well Patient left: in bed;with call bell/phone within reach Nurse Communication: Mobility status PT Visit Diagnosis: Unsteadiness on feet (R26.81);Pain Pain - part of body:  (neck)    Time: 0175-1025 PT Time Calculation (min) (ACUTE ONLY): 32 min   Charges:   PT Evaluation $PT Eval Low Complexity: 1 Low PT Treatments $Gait Training: 8-22 mins        Jenna Butler, PT, DPT Acute Rehabilitation Services Pager: [phone] Office: [phone]   Jenna Butler 09/17/2021, 10:11 AM

## 2021-09-17 NOTE — Discharge Instructions (Signed)
Wound Care °Keep the incision clean and dry remove the outer dressing in 2 days, leave the Steri-Strips intact.  °Do not put any creams, lotions, or ointments on incision. °Leave steri-strips on back.  They will fall off by themselves. ° °Activity °Walk each and every day, increasing distance each day. °No lifting greater than 5 lbs.  °No lifting no bending no twisting no driving or riding a car unless coming back and forth to see me. °If provided with back brace, wear when out of bed.  It is not necessary to wear brace in bed. ° °Diet °Resume your normal diet.  ° °Call Your Doctor If Any of These Occur °Redness, drainage, or swelling at the wound.  °Temperature greater than 101 degrees. °Severe pain not relieved by pain medication. °Incision starts to come apart. ° °Follow Up Appt °Call today for appointment in 1-2 weeks ([phone]) or for problems.  If you have any hardware placed in your spine, you will need an x-ray before your appointment. °  °

## 2021-09-17 NOTE — Discharge Summary (Signed)
Physician Discharge Summary  Patient ID: Jenna Butler MRN: [id number] DOB/AGE: 03-28-1951 70 y.o.  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 The patient tolerated the procedure well and was taken to the recovery room and then to the floor in stable condition. The hospital course was routine. There were no complications. The wound remained clean dry and intact. Pt had appropriate back soreness. No complaints of leg pain or new N/T/W. The patient remained afebrile with stable vital signs, and tolerated a regular diet. The patient continued to increase activities, and pain was well controlled with oral pain medications.   Consults: None  Significant Diagnostic Studies:  Results for orders placed or performed during the hospital encounter of 09/16/21  Glucose, capillary  Result Value Ref Range   Glucose-Capillary 82 70 - 99 mg/dL   Comment 1 Notify RN    Comment 2 Document in Chart   Glucose, capillary  Result Value Ref Range   Glucose-Capillary 94 70 - 99 mg/dL  Glucose, capillary  Result Value Ref Range   Glucose-Capillary 171 (H) 70 - 99 mg/dL  CBC  Result Value Ref Range   WBC 11.5 (H) 4.0 - 10.5 K/uL   RBC 3.54 (L) 3.87 - 5.11 MIL/uL   Hemoglobin 10.6 (L) 12.0 - 15.0 g/dL   HCT 31.4 (L) 97.0 - 26.3 %   MCV 91.2 80.0 - 100.0 fL   MCH 29.9 26.0 - 34.0 pg   MCHC 32.8 30.0 - 36.0 g/dL   RDW 78.5 88.5 - 02.7 %   Platelets 261 150 - 400 K/uL   nRBC 0.0 0.0 - 0.2 %  Glucose, capillary  Result Value Ref Range   Glucose-Capillary 209 (H) 70 - 99 mg/dL   Comment 1 Notify RN    Comment 2 Document in Chart   Glucose, capillary  Result Value Ref Range    Glucose-Capillary 167 (H) 70 - 99 mg/dL   Comment 1 Notify RN    Comment 2 Document in Chart   Glucose, capillary  Result Value Ref Range   Glucose-Capillary 188 (H) 70 - 99 mg/dL   Comment 1 Notify RN    Comment 2 Document in Chart   I-STAT 7, (LYTES, BLD GAS, ICA, H+H)  Result Value Ref Range   pH, Arterial 7.393 7.350 - 7.450   pCO2 arterial 42.1 32.0 - 48.0 mmHg   pO2, Arterial 230 (H) 83.0 - 108.0 mmHg   Bicarbonate 25.9 20.0 - 28.0 mmol/L   TCO2 27 22 - 32 mmol/L   O2 Saturation 100.0 %   Acid-Base Excess 1.0 0.0 - 2.0 mmol/L   Sodium 139 135 - 145 mmol/L   Potassium 3.7 3.5 - 5.1 mmol/L   Calcium, Ion 1.24 1.15 - 1.40 mmol/L   HCT 27.0 (L) 36.0 - 46.0 %   Hemoglobin 9.2 (L) 12.0 - 15.0 g/dL   Patient temperature 74.1 C    Sample type ARTERIAL     DG Lumbar Spine 2-3 Views  Result Date: 09/16/2021 CLINICAL DATA:  Posterior lumbar 2 3 interbody fusion. EXAM: LUMBAR SPINE - 2-3 VIEW COMPARISON:  None. FINDINGS: Two intraoperative fluoroscopic views of the lumbar spine. Lumbar fusion hardware is partially visualized. Fluoroscopy time 43 seconds,  34.4 mGy. IMPRESSION: Intraoperative lumbar fusion. Electronically Signed   By: Darliss Cheney M.D.   On: 09/16/2021 15:05   DG C-Arm 1-60 Min-No Report  Result Date: 09/16/2021 Fluoroscopy was utilized by the requesting physician.  No radiographic interpretation.   DG C-Arm 1-60 Min-No Report  Result Date: 09/16/2021 Fluoroscopy was utilized by the requesting physician.  No radiographic interpretation.    Antibiotics:  Anti-infectives (From admission, onward)    Start     Dose/Rate Route Frequency Ordered Stop   09/16/21 2130  vancomycin (VANCOREADY) IVPB 1500 mg/300 mL        1,500 mg 150 mL/hr over 120 Minutes Intravenous  Once 09/16/21 1454 09/16/21 2334   09/16/21 0845  vancomycin (VANCOCIN) IVPB 1000 mg/200 mL premix        1,000 mg 200 mL/hr over 60 Minutes Intravenous On call to O.R. 09/16/21 0836 09/16/21 1039    09/16/21 0804  vancomycin (VANCOCIN) 1-5 GM/200ML-% IVPB       Note to Pharmacy: Samuella Cota   : cabinet override      09/16/21 0804 09/16/21 1049       Discharge Exam: Blood pressure 125/65, pulse 91, temperature 98.6 F (37 C), temperature source Oral, resp. rate 18, height 5\' 6"  (1.676 m), weight 112 kg, SpO2 96 %. Neurologic: Grossly normal Ambulating and voiding well, incision cdi   Discharge Medications:   Allergies as of 09/17/2021       Reactions   Amoxicillin Shortness Of Breath, Rash   Ceclor [cefaclor] Shortness Of Breath, Itching, Swelling, Rash   Lyrica [pregabalin] Other (See Comments)   "causes me to be very depressed."   Tape Dermatitis   "tears my skin."        Medication List     TAKE these medications    albuterol 108 (90 Base) MCG/ACT inhaler Commonly known as: VENTOLIN HFA Inhale 2 puffs into the lungs every 6 (six) hours as needed for wheezing or shortness of breath.   aspirin EC 81 MG tablet Take 81 mg by mouth daily.   busPIRone 15 MG tablet Commonly known as: BUSPAR Take 15-30 mg by mouth See admin instructions. Take 15 mg by mouth in the morning and 30 mg in the evening.   CALCIUM PO Take 1 tablet by mouth daily.   cetirizine 10 MG tablet Commonly known as: ZYRTEC Take 10 mg by mouth daily.   clonazePAM 0.5 MG tablet Commonly known as: KLONOPIN Take 0.5 mg by mouth daily as needed for anxiety.   doxycycline 100 MG capsule Commonly known as: VIBRAMYCIN Take 1 capsule (100 mg total) by mouth 2 (two) times daily.   furosemide 20 MG tablet Commonly known as: LASIX Take 1 tablet (20 mg total) by mouth daily. Has not had Rx recently--would like a refill   gabapentin 300 MG capsule Commonly known as: NEURONTIN Take 600 mg by mouth 4 (four) times daily as needed (pain).   Garlic 1200 MG Caps Take 1,200 mg by mouth daily.   gemfibrozil 600 MG tablet Commonly known as: LOPID Take 600 mg by mouth 2 (two) times daily before a  meal.   glipiZIDE 10 MG tablet Commonly known as: GLUCOTROL Take 10 mg by mouth 2 (two) times daily before a meal.   glucose 4 GM chewable tablet Chew 1 tablet by mouth as needed for low blood sugar.   insulin glargine 100 UNIT/ML injection Commonly known as: LANTUS Inject 6 Units into the skin at bedtime.   lamoTRIgine 200 MG  tablet Commonly known as: LAMICTAL Take 200 mg by mouth 2 (two) times daily.   levothyroxine 200 MCG tablet Commonly known as: SYNTHROID Take 200 mcg by mouth daily before breakfast.   levothyroxine 25 MCG tablet Commonly known as: SYNTHROID Take 25 mcg by mouth daily.   levothyroxine 200 MCG tablet Commonly known as: SYNTHROID Take 1 tablet by mouth daily.   lisinopril 20 MG tablet Commonly known as: ZESTRIL Take 20 mg by mouth daily.   meloxicam 7.5 MG tablet Commonly known as: MOBIC Take 7.5 mg by mouth daily.   meloxicam 7.5 MG tablet Commonly known as: MOBIC Take 1 tablet by mouth daily.   methocarbamol 500 MG tablet Commonly known as: Robaxin Take 1 tablet (500 mg total) by mouth 4 (four) times daily.   metoprolol succinate 25 MG 24 hr tablet Commonly known as: TOPROL-XL Take 25 mg by mouth every evening.   montelukast 10 MG tablet Commonly known as: SINGULAIR Take 10 mg by mouth daily.   oxyCODONE 5 MG immediate release tablet Commonly known as: Oxy IR/ROXICODONE Take 1 tablet (5 mg total) by mouth every 6 (six) hours as needed for moderate pain ((score 4 to 6)).   phenytoin 100 MG ER capsule Commonly known as: DILANTIN Take 100 mg by mouth 3 (three) times daily.   topiramate 50 MG tablet Commonly known as: TOPAMAX Take 50 mg by mouth 2 (two) times daily.   traZODone 100 MG tablet Commonly known as: DESYREL Take 100 mg by mouth at bedtime as needed for sleep.   triamterene-hydrochlorothiazide 37.5-25 MG tablet Commonly known as: MAXZIDE-25 Take 1 tablet by mouth daily.   TYLENOL 8 HOUR PO Take 1-2 tablets by mouth  daily as needed (pain and headache).   venlafaxine XR 37.5 MG 24 hr capsule Commonly known as: EFFEXOR-XR Take 37.5 mg by mouth daily with breakfast.   venlafaxine XR 150 MG 24 hr capsule Commonly known as: EFFEXOR-XR Take 150 mg by mouth daily after lunch.               Durable Medical Equipment  (From admission, onward)           Start     Ordered   09/16/21 1406  DME Walker rolling  Once       Question:  Patient needs a walker to treat with the following condition  Answer:  S/P lumbar fusion   09/16/21 1405   09/16/21 1406  DME 3 n 1  Once        09/16/21 1405            Disposition: home   Final Dx: PLIF L2-3  Discharge Instructions      Remove dressing in 72 hours   Complete by: As directed    Call MD for:  difficulty breathing, headache or visual disturbances   Complete by: As directed    Call MD for:  hives   Complete by: As directed    Call MD for:  persistant dizziness or light-headedness   Complete by: As directed    Call MD for:  persistant nausea and vomiting   Complete by: As directed    Call MD for:  redness, tenderness, or signs of infection (pain, swelling, redness, odor or green/yellow discharge around incision site)   Complete by: As directed    Call MD for:  severe uncontrolled pain   Complete by: As directed    Call MD for:  temperature >100.4   Complete by: As directed  Diet - low sodium heart healthy   Complete by: As directed    Driving Restrictions   Complete by: As directed    No driving for 2 weeks, no riding in the car for 1 week   Increase activity slowly   Complete by: As directed    Lifting restrictions   Complete by: As directed    No lifting more than 8 lbs          Signed: Tiana Loft Siddalee Vanderheiden 09/17/2021, 8:43 AM

## 2021-09-26 DIAGNOSIS — Z20822 Contact with and (suspected) exposure to covid-19: Secondary | ICD-10-CM | POA: Diagnosis not present

## 2021-10-17 DIAGNOSIS — E782 Mixed hyperlipidemia: Secondary | ICD-10-CM | POA: Diagnosis not present

## 2021-10-17 DIAGNOSIS — I2510 Atherosclerotic heart disease of native coronary artery without angina pectoris: Secondary | ICD-10-CM | POA: Diagnosis not present

## 2021-10-17 DIAGNOSIS — J449 Chronic obstructive pulmonary disease, unspecified: Secondary | ICD-10-CM | POA: Diagnosis not present

## 2021-10-17 DIAGNOSIS — I10 Essential (primary) hypertension: Secondary | ICD-10-CM | POA: Diagnosis not present

## 2021-10-17 DIAGNOSIS — Z955 Presence of coronary angioplasty implant and graft: Secondary | ICD-10-CM | POA: Diagnosis not present

## 2021-10-29 DIAGNOSIS — M48062 Spinal stenosis, lumbar region with neurogenic claudication: Secondary | ICD-10-CM | POA: Diagnosis not present

## 2021-11-06 DIAGNOSIS — L03116 Cellulitis of left lower limb: Secondary | ICD-10-CM | POA: Diagnosis not present

## 2021-11-06 DIAGNOSIS — Z7982 Long term (current) use of aspirin: Secondary | ICD-10-CM | POA: Diagnosis not present

## 2021-11-06 DIAGNOSIS — M5441 Lumbago with sciatica, right side: Secondary | ICD-10-CM | POA: Diagnosis not present

## 2021-11-06 DIAGNOSIS — Z79899 Other long term (current) drug therapy: Secondary | ICD-10-CM | POA: Diagnosis not present

## 2021-11-06 DIAGNOSIS — J449 Chronic obstructive pulmonary disease, unspecified: Secondary | ICD-10-CM | POA: Diagnosis not present

## 2021-11-06 DIAGNOSIS — M1611 Unilateral primary osteoarthritis, right hip: Secondary | ICD-10-CM | POA: Diagnosis not present

## 2021-11-06 DIAGNOSIS — Z888 Allergy status to other drugs, medicaments and biological substances status: Secondary | ICD-10-CM | POA: Diagnosis not present

## 2021-11-06 DIAGNOSIS — Z87891 Personal history of nicotine dependence: Secondary | ICD-10-CM | POA: Diagnosis not present

## 2021-11-06 DIAGNOSIS — I10 Essential (primary) hypertension: Secondary | ICD-10-CM | POA: Diagnosis not present

## 2021-11-06 DIAGNOSIS — Z7951 Long term (current) use of inhaled steroids: Secondary | ICD-10-CM | POA: Diagnosis not present

## 2021-11-06 DIAGNOSIS — M5430 Sciatica, unspecified side: Secondary | ICD-10-CM | POA: Diagnosis not present

## 2021-11-06 DIAGNOSIS — M5431 Sciatica, right side: Secondary | ICD-10-CM | POA: Diagnosis not present

## 2021-11-16 DIAGNOSIS — F419 Anxiety disorder, unspecified: Secondary | ICD-10-CM | POA: Diagnosis not present

## 2021-11-16 DIAGNOSIS — M79675 Pain in left toe(s): Secondary | ICD-10-CM | POA: Diagnosis not present

## 2021-11-16 DIAGNOSIS — Z79890 Hormone replacement therapy (postmenopausal): Secondary | ICD-10-CM | POA: Diagnosis not present

## 2021-11-16 DIAGNOSIS — R4182 Altered mental status, unspecified: Secondary | ICD-10-CM | POA: Diagnosis not present

## 2021-11-16 DIAGNOSIS — Z79899 Other long term (current) drug therapy: Secondary | ICD-10-CM | POA: Diagnosis not present

## 2021-11-16 DIAGNOSIS — K219 Gastro-esophageal reflux disease without esophagitis: Secondary | ICD-10-CM | POA: Diagnosis not present

## 2021-11-16 DIAGNOSIS — Z87891 Personal history of nicotine dependence: Secondary | ICD-10-CM | POA: Diagnosis not present

## 2021-11-16 DIAGNOSIS — E039 Hypothyroidism, unspecified: Secondary | ICD-10-CM | POA: Diagnosis not present

## 2021-11-16 DIAGNOSIS — R251 Tremor, unspecified: Secondary | ICD-10-CM | POA: Diagnosis not present

## 2021-11-16 DIAGNOSIS — Z7982 Long term (current) use of aspirin: Secondary | ICD-10-CM | POA: Diagnosis not present

## 2021-11-16 DIAGNOSIS — I10 Essential (primary) hypertension: Secondary | ICD-10-CM | POA: Diagnosis not present

## 2021-11-16 DIAGNOSIS — R531 Weakness: Secondary | ICD-10-CM | POA: Diagnosis not present

## 2021-11-16 DIAGNOSIS — Z9109 Other allergy status, other than to drugs and biological substances: Secondary | ICD-10-CM | POA: Diagnosis not present

## 2021-11-16 DIAGNOSIS — R9431 Abnormal electrocardiogram [ECG] [EKG]: Secondary | ICD-10-CM | POA: Diagnosis not present

## 2021-11-16 DIAGNOSIS — E119 Type 2 diabetes mellitus without complications: Secondary | ICD-10-CM | POA: Diagnosis not present

## 2021-11-16 DIAGNOSIS — Z888 Allergy status to other drugs, medicaments and biological substances status: Secondary | ICD-10-CM | POA: Diagnosis not present

## 2021-11-16 DIAGNOSIS — Z881 Allergy status to other antibiotic agents status: Secondary | ICD-10-CM | POA: Diagnosis not present

## 2021-11-16 DIAGNOSIS — J449 Chronic obstructive pulmonary disease, unspecified: Secondary | ICD-10-CM | POA: Diagnosis not present

## 2021-11-16 DIAGNOSIS — R410 Disorientation, unspecified: Secondary | ICD-10-CM | POA: Diagnosis not present

## 2021-11-16 DIAGNOSIS — G629 Polyneuropathy, unspecified: Secondary | ICD-10-CM | POA: Diagnosis not present

## 2021-11-16 DIAGNOSIS — E785 Hyperlipidemia, unspecified: Secondary | ICD-10-CM | POA: Diagnosis not present

## 2021-11-16 DIAGNOSIS — M1990 Unspecified osteoarthritis, unspecified site: Secondary | ICD-10-CM | POA: Diagnosis not present

## 2021-11-16 DIAGNOSIS — Z7984 Long term (current) use of oral hypoglycemic drugs: Secondary | ICD-10-CM | POA: Diagnosis not present

## 2021-11-16 DIAGNOSIS — Z883 Allergy status to other anti-infective agents status: Secondary | ICD-10-CM | POA: Diagnosis not present

## 2021-11-18 DIAGNOSIS — I10 Essential (primary) hypertension: Secondary | ICD-10-CM | POA: Diagnosis not present

## 2021-11-18 DIAGNOSIS — E782 Mixed hyperlipidemia: Secondary | ICD-10-CM | POA: Diagnosis not present

## 2021-11-18 DIAGNOSIS — F4310 Post-traumatic stress disorder, unspecified: Secondary | ICD-10-CM | POA: Diagnosis not present

## 2021-11-18 DIAGNOSIS — F419 Anxiety disorder, unspecified: Secondary | ICD-10-CM | POA: Diagnosis not present

## 2021-11-18 DIAGNOSIS — E1165 Type 2 diabetes mellitus with hyperglycemia: Secondary | ICD-10-CM | POA: Diagnosis not present

## 2021-11-18 DIAGNOSIS — E039 Hypothyroidism, unspecified: Secondary | ICD-10-CM | POA: Diagnosis not present

## 2021-11-18 DIAGNOSIS — G4700 Insomnia, unspecified: Secondary | ICD-10-CM | POA: Diagnosis not present

## 2021-11-18 DIAGNOSIS — F331 Major depressive disorder, recurrent, moderate: Secondary | ICD-10-CM | POA: Diagnosis not present

## 2021-11-18 DIAGNOSIS — E538 Deficiency of other specified B group vitamins: Secondary | ICD-10-CM | POA: Diagnosis not present

## 2021-11-23 DIAGNOSIS — E119 Type 2 diabetes mellitus without complications: Secondary | ICD-10-CM | POA: Diagnosis not present

## 2021-11-23 DIAGNOSIS — J449 Chronic obstructive pulmonary disease, unspecified: Secondary | ICD-10-CM | POA: Diagnosis not present

## 2021-11-23 DIAGNOSIS — Z87891 Personal history of nicotine dependence: Secondary | ICD-10-CM | POA: Diagnosis not present

## 2021-11-23 DIAGNOSIS — R600 Localized edema: Secondary | ICD-10-CM | POA: Diagnosis not present

## 2021-11-23 DIAGNOSIS — I10 Essential (primary) hypertension: Secondary | ICD-10-CM | POA: Diagnosis not present

## 2021-11-23 DIAGNOSIS — M7989 Other specified soft tissue disorders: Secondary | ICD-10-CM | POA: Diagnosis not present

## 2021-11-23 DIAGNOSIS — Z79899 Other long term (current) drug therapy: Secondary | ICD-10-CM | POA: Diagnosis not present

## 2021-11-23 DIAGNOSIS — I2510 Atherosclerotic heart disease of native coronary artery without angina pectoris: Secondary | ICD-10-CM | POA: Diagnosis not present

## 2021-11-23 DIAGNOSIS — R7989 Other specified abnormal findings of blood chemistry: Secondary | ICD-10-CM | POA: Diagnosis not present

## 2021-11-23 DIAGNOSIS — Z7984 Long term (current) use of oral hypoglycemic drugs: Secondary | ICD-10-CM | POA: Diagnosis not present

## 2021-11-27 DIAGNOSIS — M79672 Pain in left foot: Secondary | ICD-10-CM | POA: Diagnosis not present

## 2021-11-27 DIAGNOSIS — E1142 Type 2 diabetes mellitus with diabetic polyneuropathy: Secondary | ICD-10-CM | POA: Diagnosis not present

## 2021-11-27 DIAGNOSIS — I83893 Varicose veins of bilateral lower extremities with other complications: Secondary | ICD-10-CM | POA: Diagnosis not present

## 2021-11-27 DIAGNOSIS — S90212A Contusion of left great toe with damage to nail, initial encounter: Secondary | ICD-10-CM | POA: Diagnosis not present

## 2021-11-28 DIAGNOSIS — M5417 Radiculopathy, lumbosacral region: Secondary | ICD-10-CM | POA: Diagnosis not present

## 2021-11-28 DIAGNOSIS — G5603 Carpal tunnel syndrome, bilateral upper limbs: Secondary | ICD-10-CM | POA: Diagnosis not present

## 2021-11-28 DIAGNOSIS — R270 Ataxia, unspecified: Secondary | ICD-10-CM | POA: Diagnosis not present

## 2021-11-28 DIAGNOSIS — R410 Disorientation, unspecified: Secondary | ICD-10-CM | POA: Diagnosis not present

## 2021-11-28 DIAGNOSIS — M5412 Radiculopathy, cervical region: Secondary | ICD-10-CM | POA: Diagnosis not present

## 2021-11-28 DIAGNOSIS — G603 Idiopathic progressive neuropathy: Secondary | ICD-10-CM | POA: Diagnosis not present

## 2021-11-28 DIAGNOSIS — R296 Repeated falls: Secondary | ICD-10-CM | POA: Diagnosis not present

## 2021-11-28 DIAGNOSIS — G5623 Lesion of ulnar nerve, bilateral upper limbs: Secondary | ICD-10-CM | POA: Diagnosis not present

## 2021-12-04 DIAGNOSIS — Z1231 Encounter for screening mammogram for malignant neoplasm of breast: Secondary | ICD-10-CM | POA: Diagnosis not present

## 2021-12-16 DIAGNOSIS — Z20822 Contact with and (suspected) exposure to covid-19: Secondary | ICD-10-CM | POA: Diagnosis not present

## 2021-12-19 DIAGNOSIS — F331 Major depressive disorder, recurrent, moderate: Secondary | ICD-10-CM | POA: Diagnosis not present

## 2021-12-19 DIAGNOSIS — G4700 Insomnia, unspecified: Secondary | ICD-10-CM | POA: Diagnosis not present

## 2021-12-19 DIAGNOSIS — F419 Anxiety disorder, unspecified: Secondary | ICD-10-CM | POA: Diagnosis not present

## 2021-12-19 DIAGNOSIS — F4310 Post-traumatic stress disorder, unspecified: Secondary | ICD-10-CM | POA: Diagnosis not present

## 2021-12-25 DIAGNOSIS — G40909 Epilepsy, unspecified, not intractable, without status epilepticus: Secondary | ICD-10-CM | POA: Diagnosis not present

## 2021-12-25 DIAGNOSIS — E1165 Type 2 diabetes mellitus with hyperglycemia: Secondary | ICD-10-CM | POA: Diagnosis not present

## 2021-12-25 DIAGNOSIS — E039 Hypothyroidism, unspecified: Secondary | ICD-10-CM | POA: Diagnosis not present

## 2021-12-25 DIAGNOSIS — I2510 Atherosclerotic heart disease of native coronary artery without angina pectoris: Secondary | ICD-10-CM | POA: Diagnosis not present

## 2021-12-25 DIAGNOSIS — E782 Mixed hyperlipidemia: Secondary | ICD-10-CM | POA: Diagnosis not present

## 2021-12-25 DIAGNOSIS — Z794 Long term (current) use of insulin: Secondary | ICD-10-CM | POA: Diagnosis not present

## 2021-12-25 DIAGNOSIS — J410 Simple chronic bronchitis: Secondary | ICD-10-CM | POA: Diagnosis not present

## 2021-12-25 DIAGNOSIS — Z7984 Long term (current) use of oral hypoglycemic drugs: Secondary | ICD-10-CM | POA: Diagnosis not present

## 2021-12-25 DIAGNOSIS — Z0000 Encounter for general adult medical examination without abnormal findings: Secondary | ICD-10-CM | POA: Diagnosis not present

## 2021-12-25 DIAGNOSIS — I10 Essential (primary) hypertension: Secondary | ICD-10-CM | POA: Diagnosis not present

## 2021-12-25 DIAGNOSIS — R413 Other amnesia: Secondary | ICD-10-CM | POA: Diagnosis not present

## 2021-12-25 DIAGNOSIS — Z955 Presence of coronary angioplasty implant and graft: Secondary | ICD-10-CM | POA: Diagnosis not present

## 2021-12-31 DIAGNOSIS — G4719 Other hypersomnia: Secondary | ICD-10-CM | POA: Diagnosis not present

## 2021-12-31 DIAGNOSIS — G250 Essential tremor: Secondary | ICD-10-CM | POA: Diagnosis not present

## 2021-12-31 DIAGNOSIS — Z79899 Other long term (current) drug therapy: Secondary | ICD-10-CM | POA: Diagnosis not present

## 2021-12-31 DIAGNOSIS — G43909 Migraine, unspecified, not intractable, without status migrainosus: Secondary | ICD-10-CM | POA: Diagnosis not present

## 2021-12-31 DIAGNOSIS — G40209 Localization-related (focal) (partial) symptomatic epilepsy and epileptic syndromes with complex partial seizures, not intractable, without status epilepticus: Secondary | ICD-10-CM | POA: Diagnosis not present

## 2022-01-16 DIAGNOSIS — R3911 Hesitancy of micturition: Secondary | ICD-10-CM | POA: Diagnosis not present

## 2022-01-16 DIAGNOSIS — D649 Anemia, unspecified: Secondary | ICD-10-CM | POA: Diagnosis not present

## 2022-01-16 DIAGNOSIS — R413 Other amnesia: Secondary | ICD-10-CM | POA: Diagnosis not present

## 2022-01-16 DIAGNOSIS — M7989 Other specified soft tissue disorders: Secondary | ICD-10-CM | POA: Diagnosis not present

## 2022-01-16 DIAGNOSIS — E1165 Type 2 diabetes mellitus with hyperglycemia: Secondary | ICD-10-CM | POA: Diagnosis not present

## 2022-01-16 DIAGNOSIS — I10 Essential (primary) hypertension: Secondary | ICD-10-CM | POA: Diagnosis not present

## 2022-01-16 DIAGNOSIS — N179 Acute kidney failure, unspecified: Secondary | ICD-10-CM | POA: Diagnosis not present

## 2022-01-16 DIAGNOSIS — Z7409 Other reduced mobility: Secondary | ICD-10-CM | POA: Diagnosis not present

## 2022-01-16 DIAGNOSIS — G40909 Epilepsy, unspecified, not intractable, without status epilepticus: Secondary | ICD-10-CM | POA: Diagnosis not present

## 2022-01-16 DIAGNOSIS — R296 Repeated falls: Secondary | ICD-10-CM | POA: Diagnosis not present

## 2022-01-16 DIAGNOSIS — R82998 Other abnormal findings in urine: Secondary | ICD-10-CM | POA: Diagnosis not present

## 2022-01-21 DIAGNOSIS — D649 Anemia, unspecified: Secondary | ICD-10-CM | POA: Diagnosis not present

## 2022-01-21 DIAGNOSIS — N179 Acute kidney failure, unspecified: Secondary | ICD-10-CM | POA: Diagnosis not present

## 2022-01-21 DIAGNOSIS — M7989 Other specified soft tissue disorders: Secondary | ICD-10-CM | POA: Diagnosis not present

## 2022-02-06 DIAGNOSIS — R413 Other amnesia: Secondary | ICD-10-CM | POA: Diagnosis not present

## 2022-02-06 DIAGNOSIS — Z5181 Encounter for therapeutic drug level monitoring: Secondary | ICD-10-CM | POA: Diagnosis not present

## 2022-02-06 DIAGNOSIS — Z79899 Other long term (current) drug therapy: Secondary | ICD-10-CM | POA: Diagnosis not present

## 2022-02-06 DIAGNOSIS — R569 Unspecified convulsions: Secondary | ICD-10-CM | POA: Diagnosis not present

## 2022-02-17 DIAGNOSIS — E1142 Type 2 diabetes mellitus with diabetic polyneuropathy: Secondary | ICD-10-CM | POA: Diagnosis not present

## 2022-02-17 DIAGNOSIS — M7741 Metatarsalgia, right foot: Secondary | ICD-10-CM | POA: Diagnosis not present

## 2022-02-17 DIAGNOSIS — S90212D Contusion of left great toe with damage to nail, subsequent encounter: Secondary | ICD-10-CM | POA: Diagnosis not present

## 2022-02-17 DIAGNOSIS — I83893 Varicose veins of bilateral lower extremities with other complications: Secondary | ICD-10-CM | POA: Diagnosis not present

## 2022-02-17 DIAGNOSIS — M7742 Metatarsalgia, left foot: Secondary | ICD-10-CM | POA: Diagnosis not present

## 2022-02-18 DIAGNOSIS — Z888 Allergy status to other drugs, medicaments and biological substances status: Secondary | ICD-10-CM | POA: Diagnosis not present

## 2022-02-18 DIAGNOSIS — Z9049 Acquired absence of other specified parts of digestive tract: Secondary | ICD-10-CM | POA: Diagnosis not present

## 2022-02-18 DIAGNOSIS — Z91048 Other nonmedicinal substance allergy status: Secondary | ICD-10-CM | POA: Diagnosis not present

## 2022-02-18 DIAGNOSIS — K589 Irritable bowel syndrome without diarrhea: Secondary | ICD-10-CM | POA: Diagnosis not present

## 2022-02-18 DIAGNOSIS — M7989 Other specified soft tissue disorders: Secondary | ICD-10-CM | POA: Diagnosis not present

## 2022-02-18 DIAGNOSIS — R600 Localized edema: Secondary | ICD-10-CM | POA: Diagnosis not present

## 2022-02-18 DIAGNOSIS — E039 Hypothyroidism, unspecified: Secondary | ICD-10-CM | POA: Diagnosis not present

## 2022-02-18 DIAGNOSIS — M1990 Unspecified osteoarthritis, unspecified site: Secondary | ICD-10-CM | POA: Diagnosis not present

## 2022-02-18 DIAGNOSIS — G4730 Sleep apnea, unspecified: Secondary | ICD-10-CM | POA: Diagnosis not present

## 2022-02-18 DIAGNOSIS — Z87891 Personal history of nicotine dependence: Secondary | ICD-10-CM | POA: Diagnosis not present

## 2022-02-18 DIAGNOSIS — I2510 Atherosclerotic heart disease of native coronary artery without angina pectoris: Secondary | ICD-10-CM | POA: Diagnosis not present

## 2022-02-18 DIAGNOSIS — K219 Gastro-esophageal reflux disease without esophagitis: Secondary | ICD-10-CM | POA: Diagnosis not present

## 2022-02-18 DIAGNOSIS — Z7982 Long term (current) use of aspirin: Secondary | ICD-10-CM | POA: Diagnosis not present

## 2022-02-18 DIAGNOSIS — R2243 Localized swelling, mass and lump, lower limb, bilateral: Secondary | ICD-10-CM | POA: Diagnosis not present

## 2022-02-18 DIAGNOSIS — Z7984 Long term (current) use of oral hypoglycemic drugs: Secondary | ICD-10-CM | POA: Diagnosis not present

## 2022-02-18 DIAGNOSIS — Z79899 Other long term (current) drug therapy: Secondary | ICD-10-CM | POA: Diagnosis not present

## 2022-02-18 DIAGNOSIS — Z881 Allergy status to other antibiotic agents status: Secondary | ICD-10-CM | POA: Diagnosis not present

## 2022-02-18 DIAGNOSIS — E278 Other specified disorders of adrenal gland: Secondary | ICD-10-CM | POA: Diagnosis not present

## 2022-02-18 DIAGNOSIS — R7989 Other specified abnormal findings of blood chemistry: Secondary | ICD-10-CM | POA: Diagnosis not present

## 2022-02-18 DIAGNOSIS — Z880 Allergy status to penicillin: Secondary | ICD-10-CM | POA: Diagnosis not present

## 2022-02-18 DIAGNOSIS — E1122 Type 2 diabetes mellitus with diabetic chronic kidney disease: Secondary | ICD-10-CM | POA: Diagnosis not present

## 2022-02-18 DIAGNOSIS — I130 Hypertensive heart and chronic kidney disease with heart failure and stage 1 through stage 4 chronic kidney disease, or unspecified chronic kidney disease: Secondary | ICD-10-CM | POA: Diagnosis not present

## 2022-02-18 DIAGNOSIS — E785 Hyperlipidemia, unspecified: Secondary | ICD-10-CM | POA: Diagnosis not present

## 2022-02-18 DIAGNOSIS — J90 Pleural effusion, not elsewhere classified: Secondary | ICD-10-CM | POA: Diagnosis not present

## 2022-02-18 DIAGNOSIS — J449 Chronic obstructive pulmonary disease, unspecified: Secondary | ICD-10-CM | POA: Diagnosis not present

## 2022-02-18 DIAGNOSIS — F32A Depression, unspecified: Secondary | ICD-10-CM | POA: Diagnosis not present

## 2022-02-18 DIAGNOSIS — N189 Chronic kidney disease, unspecified: Secondary | ICD-10-CM | POA: Diagnosis not present

## 2022-02-18 DIAGNOSIS — Z79890 Hormone replacement therapy (postmenopausal): Secondary | ICD-10-CM | POA: Diagnosis not present

## 2022-02-18 DIAGNOSIS — Z90710 Acquired absence of both cervix and uterus: Secondary | ICD-10-CM | POA: Diagnosis not present

## 2022-02-18 DIAGNOSIS — F419 Anxiety disorder, unspecified: Secondary | ICD-10-CM | POA: Diagnosis not present

## 2022-02-18 DIAGNOSIS — I509 Heart failure, unspecified: Secondary | ICD-10-CM | POA: Diagnosis not present

## 2022-02-18 DIAGNOSIS — R109 Unspecified abdominal pain: Secondary | ICD-10-CM | POA: Diagnosis not present

## 2022-02-19 DIAGNOSIS — M7989 Other specified soft tissue disorders: Secondary | ICD-10-CM | POA: Diagnosis not present

## 2022-02-24 DIAGNOSIS — F331 Major depressive disorder, recurrent, moderate: Secondary | ICD-10-CM | POA: Diagnosis not present

## 2022-02-24 DIAGNOSIS — G4700 Insomnia, unspecified: Secondary | ICD-10-CM | POA: Diagnosis not present

## 2022-02-24 DIAGNOSIS — F419 Anxiety disorder, unspecified: Secondary | ICD-10-CM | POA: Diagnosis not present

## 2022-02-24 DIAGNOSIS — F4310 Post-traumatic stress disorder, unspecified: Secondary | ICD-10-CM | POA: Diagnosis not present

## 2022-02-26 DIAGNOSIS — M25551 Pain in right hip: Secondary | ICD-10-CM | POA: Diagnosis not present

## 2022-02-26 DIAGNOSIS — D649 Anemia, unspecified: Secondary | ICD-10-CM | POA: Diagnosis not present

## 2022-02-26 DIAGNOSIS — M79642 Pain in left hand: Secondary | ICD-10-CM | POA: Diagnosis not present

## 2022-02-26 DIAGNOSIS — I129 Hypertensive chronic kidney disease with stage 1 through stage 4 chronic kidney disease, or unspecified chronic kidney disease: Secondary | ICD-10-CM | POA: Diagnosis not present

## 2022-02-26 DIAGNOSIS — N1831 Chronic kidney disease, stage 3a: Secondary | ICD-10-CM | POA: Diagnosis not present

## 2022-02-26 DIAGNOSIS — Y92009 Unspecified place in unspecified non-institutional (private) residence as the place of occurrence of the external cause: Secondary | ICD-10-CM | POA: Diagnosis not present

## 2022-02-26 DIAGNOSIS — R413 Other amnesia: Secondary | ICD-10-CM | POA: Diagnosis not present

## 2022-02-26 DIAGNOSIS — G40909 Epilepsy, unspecified, not intractable, without status epilepticus: Secondary | ICD-10-CM | POA: Diagnosis not present

## 2022-02-26 DIAGNOSIS — W19XXXA Unspecified fall, initial encounter: Secondary | ICD-10-CM | POA: Diagnosis not present

## 2022-02-26 DIAGNOSIS — I10 Essential (primary) hypertension: Secondary | ICD-10-CM | POA: Diagnosis not present

## 2022-02-26 DIAGNOSIS — E1165 Type 2 diabetes mellitus with hyperglycemia: Secondary | ICD-10-CM | POA: Diagnosis not present

## 2022-02-26 DIAGNOSIS — M25552 Pain in left hip: Secondary | ICD-10-CM | POA: Diagnosis not present

## 2022-02-26 DIAGNOSIS — M7989 Other specified soft tissue disorders: Secondary | ICD-10-CM | POA: Diagnosis not present

## 2022-03-13 DIAGNOSIS — M79604 Pain in right leg: Secondary | ICD-10-CM | POA: Diagnosis not present

## 2022-03-13 DIAGNOSIS — M79605 Pain in left leg: Secondary | ICD-10-CM | POA: Diagnosis not present

## 2022-03-13 DIAGNOSIS — R600 Localized edema: Secondary | ICD-10-CM | POA: Diagnosis not present

## 2022-03-18 DIAGNOSIS — E538 Deficiency of other specified B group vitamins: Secondary | ICD-10-CM | POA: Diagnosis not present

## 2022-03-18 DIAGNOSIS — D500 Iron deficiency anemia secondary to blood loss (chronic): Secondary | ICD-10-CM | POA: Diagnosis not present

## 2022-03-18 DIAGNOSIS — D649 Anemia, unspecified: Secondary | ICD-10-CM | POA: Diagnosis not present

## 2022-03-24 DIAGNOSIS — D500 Iron deficiency anemia secondary to blood loss (chronic): Secondary | ICD-10-CM | POA: Diagnosis not present

## 2022-03-26 DIAGNOSIS — R410 Disorientation, unspecified: Secondary | ICD-10-CM | POA: Diagnosis not present

## 2022-03-26 DIAGNOSIS — G40909 Epilepsy, unspecified, not intractable, without status epilepticus: Secondary | ICD-10-CM | POA: Diagnosis not present

## 2022-03-31 DIAGNOSIS — D500 Iron deficiency anemia secondary to blood loss (chronic): Secondary | ICD-10-CM | POA: Diagnosis not present

## 2022-04-11 DIAGNOSIS — G40909 Epilepsy, unspecified, not intractable, without status epilepticus: Secondary | ICD-10-CM | POA: Diagnosis not present

## 2022-04-17 DIAGNOSIS — M79605 Pain in left leg: Secondary | ICD-10-CM | POA: Diagnosis not present

## 2022-04-17 DIAGNOSIS — M79604 Pain in right leg: Secondary | ICD-10-CM | POA: Diagnosis not present

## 2022-04-17 DIAGNOSIS — R600 Localized edema: Secondary | ICD-10-CM | POA: Diagnosis not present

## 2022-04-24 DIAGNOSIS — M79604 Pain in right leg: Secondary | ICD-10-CM | POA: Diagnosis not present

## 2022-04-24 DIAGNOSIS — R600 Localized edema: Secondary | ICD-10-CM | POA: Diagnosis not present

## 2022-04-24 DIAGNOSIS — M79605 Pain in left leg: Secondary | ICD-10-CM | POA: Diagnosis not present

## 2022-05-12 DIAGNOSIS — D500 Iron deficiency anemia secondary to blood loss (chronic): Secondary | ICD-10-CM | POA: Diagnosis not present

## 2022-05-14 DIAGNOSIS — Z5181 Encounter for therapeutic drug level monitoring: Secondary | ICD-10-CM | POA: Diagnosis not present

## 2022-05-14 DIAGNOSIS — G40909 Epilepsy, unspecified, not intractable, without status epilepticus: Secondary | ICD-10-CM | POA: Diagnosis not present

## 2022-05-14 DIAGNOSIS — R4189 Other symptoms and signs involving cognitive functions and awareness: Secondary | ICD-10-CM | POA: Diagnosis not present

## 2022-05-17 ENCOUNTER — Emergency Department (HOSPITAL_COMMUNITY): Payer: Medicare Other

## 2022-05-17 ENCOUNTER — Emergency Department (HOSPITAL_COMMUNITY)
Admission: EM | Admit: 2022-05-17 | Discharge: 2022-05-17 | Disposition: A | Discharge status: Home / Self Care | Payer: Medicare Other | Attending: Emergency Medicine | Admitting: Emergency Medicine

## 2022-05-17 DIAGNOSIS — L03032 Cellulitis of left toe: Secondary | ICD-10-CM

## 2022-05-17 DIAGNOSIS — Z7982 Long term (current) use of aspirin: Secondary | ICD-10-CM | POA: Insufficient documentation

## 2022-05-17 DIAGNOSIS — Z79899 Other long term (current) drug therapy: Secondary | ICD-10-CM | POA: Insufficient documentation

## 2022-05-17 DIAGNOSIS — M79672 Pain in left foot: Secondary | ICD-10-CM | POA: Diagnosis not present

## 2022-05-17 DIAGNOSIS — M7989 Other specified soft tissue disorders: Secondary | ICD-10-CM | POA: Diagnosis not present

## 2022-05-17 DIAGNOSIS — L03116 Cellulitis of left lower limb: Secondary | ICD-10-CM | POA: Diagnosis not present

## 2022-05-17 LAB — CBC WITH DIFFERENTIAL/PLATELET
Abs Immature Granulocytes: 0.02 10*3/uL (ref 0.00–0.07)
Basophils Absolute: 0 10*3/uL (ref 0.0–0.1)
Basophils Relative: 1 %
Eosinophils Absolute: 0.1 10*3/uL (ref 0.0–0.5)
Eosinophils Relative: 3 %
HCT: 31.1 % — ABNORMAL LOW (ref 36.0–46.0)
Hemoglobin: 10.1 g/dL — ABNORMAL LOW (ref 12.0–15.0)
Immature Granulocytes: 1 %
Lymphocytes Relative: 29 %
Lymphs Abs: 1.2 10*3/uL (ref 0.7–4.0)
MCH: 29.9 pg (ref 26.0–34.0)
MCHC: 32.5 g/dL (ref 30.0–36.0)
MCV: 92 fL (ref 80.0–100.0)
Monocytes Absolute: 0.5 10*3/uL (ref 0.1–1.0)
Monocytes Relative: 12 %
Neutro Abs: 2.2 10*3/uL (ref 1.7–7.7)
Neutrophils Relative %: 54 %
Platelets: 163 10*3/uL (ref 150–400)
RBC: 3.38 MIL/uL — ABNORMAL LOW (ref 3.87–5.11)
RDW: 16.4 % — ABNORMAL HIGH (ref 11.5–15.5)
WBC: 4 10*3/uL (ref 4.0–10.5)
nRBC: 0 % (ref 0.0–0.2)

## 2022-05-17 LAB — BASIC METABOLIC PANEL
Anion gap: 6 (ref 5–15)
BUN: 21 mg/dL (ref 8–23)
CO2: 25 mmol/L (ref 22–32)
Calcium: 9.4 mg/dL (ref 8.9–10.3)
Chloride: 109 mmol/L (ref 98–111)
Creatinine, Ser: 0.97 mg/dL (ref 0.44–1.00)
GFR, Estimated: >60 mL/min (ref >60)
Glucose, Bld: 140 mg/dL — ABNORMAL HIGH (ref 70–99)
Potassium: 3.6 mmol/L (ref 3.5–5.1)
Sodium: 140 mmol/L (ref 135–145)

## 2022-05-17 MED ORDER — DOXYCYCLINE HYCLATE 100 MG PO CAPS: 100.0000 mg | ORAL_CAPSULE | Freq: Two times a day (BID) | ORAL | 0 refills | Status: AC

## 2022-05-17 MED ORDER — DOXYCYCLINE HYCLATE 100 MG PO TABS
100.0000 mg | ORAL_TABLET | Freq: Once | ORAL | Status: AC
  Administered 2022-05-17: 100 mg via ORAL
  Filled 2022-05-17: qty 1

## 2022-05-17 NOTE — ED Notes (Signed)
Pt ambulated with a walker to the bathroom.

## 2022-05-17 NOTE — Discharge Instructions (Addendum)
If you develop new or worsening swelling, pain, redness, or if you develop a fever, or any other new/concerning symptoms then return to the ER for evaluation.

## 2022-05-17 NOTE — ED Triage Notes (Signed)
Pt c/o "trouble" w L great toe x "a pretty good while," surgery scheduled for removal of nail but reports increased pain & concern for infection.

## 2022-05-17 NOTE — ED Provider Notes (Signed)
[facility] EMERGENCY DEPARTMENT Provider Note   CSN: [id number] Arrival date & time: 05/17/22  1925     History  Chief Complaint  Patient presents with   Foot Pain    Jenna Butler is a 71 y.o. female.  HPI 71 year old female presents with concern for a left toe/foot infection.  She is been dealing with issues with her toenails, especially the left great toe for quite some time.  A few days ago she pulled off the rest of her toenail after she had had previous procedure by her podiatrist.  Since then she has been noticing redness and increased swelling.  No fevers but may be some chills.  Otherwise her foot feels more swollen and the left great toe is diffusely erythematous.  Home Medications Prior to Admission medications   Medication Sig Start Date End Date Taking? Authorizing Provider  doxycycline (VIBRAMYCIN) 100 MG capsule Take 1 capsule (100 mg total) by mouth 2 (two) times daily. One po bid x 7 days 05/17/22  Yes Pricilla Loveless, MD  Acetaminophen (TYLENOL 8 HOUR PO) Take 1-2 tablets by mouth daily as needed (pain and headache).    [provider]  albuterol (PROVENTIL HFA;VENTOLIN HFA) 108 (90 BASE) MCG/ACT inhaler Inhale 2 puffs into the lungs every 6 (six) hours as needed for wheezing or shortness of breath.    [provider]  aspirin EC 81 MG tablet Take 81 mg by mouth daily.    [provider]  busPIRone (BUSPAR) 15 MG tablet Take 15-30 mg by mouth See admin instructions. Take 15 mg by mouth in the morning and 30 mg in the evening.    [provider]  CALCIUM PO Take 1 tablet by mouth daily.    [provider]  cetirizine (ZYRTEC) 10 MG tablet Take 10 mg by mouth daily.    [provider]  clonazePAM (KLONOPIN) 0.5 MG tablet Take 0.5 mg by mouth daily as needed for anxiety.    [provider]  furosemide (LASIX) 20 MG tablet Take 1 tablet (20 mg total) by mouth daily. Has not had Rx  recently--would like a refill 07/16/21   Eustace Moore, MD  gabapentin (NEURONTIN) 300 MG capsule Take 600 mg by mouth 4 (four) times daily as needed (pain).    [provider]  Garlic 1200 MG CAPS Take 1,200 mg by mouth daily.    [provider]  gemfibrozil (LOPID) 600 MG tablet Take 600 mg by mouth 2 (two) times daily before a meal.    [provider]  glipiZIDE (GLUCOTROL) 10 MG tablet Take 10 mg by mouth 2 (two) times daily before a meal.    [provider]  glucose 4 GM chewable tablet Chew 1 tablet by mouth as needed for low blood sugar.    [provider]  insulin glargine (LANTUS) 100 UNIT/ML injection Inject 6 Units into the skin at bedtime.    [provider]  lamoTRIgine (LAMICTAL) 200 MG tablet Take 200 mg by mouth 2 (two) times daily.    [provider]  levothyroxine (SYNTHROID) 200 MCG tablet Take 1 tablet by mouth daily. 08/08/21   [provider]  levothyroxine (SYNTHROID) 25 MCG tablet Take 25 mcg by mouth daily. 05/15/21   [provider]  levothyroxine (SYNTHROID, LEVOTHROID) 200 MCG tablet Take 200 mcg by mouth daily before breakfast.    [provider]  lisinopril (PRINIVIL,ZESTRIL) 20 MG tablet Take 20 mg by mouth daily.  [provider]  meloxicam (MOBIC) 7.5 MG tablet Take 7.5 mg by mouth daily.    [provider]  meloxicam (MOBIC) 7.5 MG tablet Take 1 tablet by mouth daily. 07/30/21   [provider]  methocarbamol (ROBAXIN) 500 MG tablet Take 1 tablet (500 mg total) by mouth 4 (four) times daily. 09/17/21   Meyran, Tiana Loft, NP  metoprolol succinate (TOPROL-XL) 25 MG 24 hr tablet Take 25 mg by mouth every evening. 08/26/16   [provider]  montelukast (SINGULAIR) 10 MG tablet Take 10 mg by mouth daily.    [provider]  oxyCODONE (OXY IR/ROXICODONE) 5 MG immediate release tablet Take 1 tablet (5 mg total) by mouth every 6 (six)  hours as needed for moderate pain ((score 4 to 6)). 07/25/21   Tia Alert, MD  phenytoin (DILANTIN) 100 MG ER capsule Take 100 mg by mouth 3 (three) times daily.    [provider]  topiramate (TOPAMAX) 50 MG tablet Take 50 mg by mouth 2 (two) times daily.    [provider]  traZODone (DESYREL) 100 MG tablet Take 100 mg by mouth at bedtime as needed for sleep.    [provider]  triamterene-hydrochlorothiazide (MAXZIDE-25) 37.5-25 MG tablet Take 1 tablet by mouth daily.    [provider]  venlafaxine XR (EFFEXOR-XR) 150 MG 24 hr capsule Take 150 mg by mouth daily after lunch. 09/15/16   [provider]  venlafaxine XR (EFFEXOR-XR) 37.5 MG 24 hr capsule Take 37.5 mg by mouth daily with breakfast. 09/15/16   [provider]      Allergies    Amoxicillin, Ceclor [cefaclor], Lyrica [pregabalin], and Tape    Review of Systems   Review of Systems  Constitutional:  Positive for chills. Negative for fever.  Musculoskeletal:  Positive for arthralgias and joint swelling.  Skin:  Positive for color change.   Physical Exam Updated Vital Signs BP (!) 159/65   Pulse 68   Temp 97.7 F (36.5 C)   Resp 16   SpO2 100%  Physical Exam Vitals and nursing note reviewed.  Constitutional:      Appearance: She is well-developed.  HENT:     Head: Normocephalic and atraumatic.  Cardiovascular:     Rate and Rhythm: Normal rate and regular rhythm.     Pulses:          Dorsalis pedis pulses are 2+ on the left side.  Pulmonary:     Effort: Pulmonary effort is normal.  Abdominal:     General: There is no distension.  Musculoskeletal:     Comments: Left great toe is diffusely erythematous and mildly swollen.  No obvious abscess/fluctuance.  There is a little bit of distal foot swelling and perhaps erythema as well.  Strong DP pulse.  Normal passive range of motion of the toe.  Skin:    General: Skin is warm and dry.     Findings: Erythema present.   Neurological:     Mental Status: She is alert.       ED Results / Procedures / Treatments   Labs (all labs ordered are listed, but only abnormal results are displayed) Labs Reviewed  BASIC METABOLIC PANEL - Abnormal; Notable for the following components:      Result Value   Glucose, Bld 140 (*)    All other components within normal limits  CBC WITH DIFFERENTIAL/PLATELET - Abnormal; Notable for the following components:   RBC 3.38 (*)    Hemoglobin  10.1 (*)    HCT 31.1 (*)    RDW 16.4 (*)    All other components within normal limits    EKG None  Radiology DG Foot Complete Left  Result Date: 05/17/2022 CLINICAL DATA:  Left foot pain. EXAM: LEFT FOOT - COMPLETE 3+ VIEW COMPARISON:  None Available. FINDINGS: There is no evidence of fracture or dislocation. There is mild degenerative narrowing of the first metatarsophalangeal joint. There is soft tissue swelling over the dorsum of the foot. Peripheral vascular calcifications are present. IMPRESSION: 1. Soft tissue swelling over the dorsum of the foot. 2. No acute bony abnormality. Electronically Signed   By: Darliss CheneyAmy  Guttmann M.D.   On: 05/17/2022 20:13    Procedures Procedures    Medications Ordered in ED Medications  doxycycline (VIBRA-TABS) tablet 100 mg (100 mg Oral Given 05/17/22 2126)    ED Course/ Medical Decision Making/ A&P                           Medical Decision Making Amount and/or Complexity of Data Reviewed Independent Historian: spouse External Data Reviewed: notes. Labs: ordered. Radiology: ordered and independent interpretation performed.  Risk Prescription drug management.   No clinical abscess or paronychia on exam.  Likely has developed an infection from when she remove the rest of her toenail.  However at this point she reports a little bit of chills but no real other systemic symptoms and her vitals are okay.  Labs were obtained and her WBC is normal.  Electrolytes are okay besides slight  hyperglycemia which is a known problem for her.  At this point, I think she can use warm soaks and will prescribe her doxycycline and refer her back to her podiatrist.  X-ray images viewed by myself and there is no obvious osteomyelitis and I have low suspicion of this given no specific wound.  She was given return precautions.        Final Clinical Impression(s) / ED Diagnoses Final diagnoses:  Cellulitis of left toe    Rx / DC Orders ED Discharge Orders          Ordered    doxycycline (VIBRAMYCIN) 100 MG capsule  2 times daily        05/17/22 2225              Pricilla LovelessGoldston, Travarius Lange, MD 05/17/22 2306

## 2022-05-18 ENCOUNTER — Telehealth: Payer: Self-pay | Admitting: Surgery

## 2022-05-18 NOTE — Telephone Encounter (Signed)
ED RNCM received call from ED [place] cion[place] patient's pharmacy not receiving her discharge prescription. Reviewed record and contacted [facility] in [place], prescription verified and read and aloud, Updated patient

## 2022-05-19 ENCOUNTER — Telehealth: Payer: Self-pay | Admitting: *Deleted

## 2022-05-19 NOTE — Telephone Encounter (Signed)
Pt called regarding referral for follow-up not being sent to her podiatry office.  RNCM routed/faxed referral via Epic to Dr Vanita Panda as requested.

## 2022-05-20 DIAGNOSIS — L03115 Cellulitis of right lower limb: Secondary | ICD-10-CM | POA: Diagnosis not present

## 2022-05-20 DIAGNOSIS — E1142 Type 2 diabetes mellitus with diabetic polyneuropathy: Secondary | ICD-10-CM | POA: Diagnosis not present

## 2022-05-20 DIAGNOSIS — I83893 Varicose veins of bilateral lower extremities with other complications: Secondary | ICD-10-CM | POA: Diagnosis not present

## 2022-05-20 DIAGNOSIS — R600 Localized edema: Secondary | ICD-10-CM | POA: Diagnosis not present

## 2022-05-20 DIAGNOSIS — I872 Venous insufficiency (chronic) (peripheral): Secondary | ICD-10-CM | POA: Diagnosis not present

## 2022-05-20 DIAGNOSIS — L03116 Cellulitis of left lower limb: Secondary | ICD-10-CM | POA: Diagnosis not present

## 2022-06-01 DIAGNOSIS — R2681 Unsteadiness on feet: Secondary | ICD-10-CM | POA: Diagnosis not present

## 2022-06-01 DIAGNOSIS — Z888 Allergy status to other drugs, medicaments and biological substances status: Secondary | ICD-10-CM | POA: Diagnosis not present

## 2022-06-01 DIAGNOSIS — D649 Anemia, unspecified: Secondary | ICD-10-CM | POA: Diagnosis not present

## 2022-06-01 DIAGNOSIS — R531 Weakness: Secondary | ICD-10-CM | POA: Diagnosis not present

## 2022-06-01 DIAGNOSIS — Z955 Presence of coronary angioplasty implant and graft: Secondary | ICD-10-CM | POA: Diagnosis not present

## 2022-06-01 DIAGNOSIS — S0990XA Unspecified injury of head, initial encounter: Secondary | ICD-10-CM | POA: Diagnosis not present

## 2022-06-01 DIAGNOSIS — G4733 Obstructive sleep apnea (adult) (pediatric): Secondary | ICD-10-CM | POA: Diagnosis not present

## 2022-06-01 DIAGNOSIS — Z7982 Long term (current) use of aspirin: Secondary | ICD-10-CM | POA: Diagnosis not present

## 2022-06-01 DIAGNOSIS — R269 Unspecified abnormalities of gait and mobility: Secondary | ICD-10-CM | POA: Diagnosis not present

## 2022-06-01 DIAGNOSIS — Z792 Long term (current) use of antibiotics: Secondary | ICD-10-CM | POA: Diagnosis not present

## 2022-06-01 DIAGNOSIS — M7732 Calcaneal spur, left foot: Secondary | ICD-10-CM | POA: Diagnosis not present

## 2022-06-01 DIAGNOSIS — Z880 Allergy status to penicillin: Secondary | ICD-10-CM | POA: Diagnosis not present

## 2022-06-01 DIAGNOSIS — M899 Disorder of bone, unspecified: Secondary | ICD-10-CM | POA: Diagnosis not present

## 2022-06-01 DIAGNOSIS — Z79890 Hormone replacement therapy (postmenopausal): Secondary | ICD-10-CM | POA: Diagnosis not present

## 2022-06-01 DIAGNOSIS — R296 Repeated falls: Secondary | ICD-10-CM | POA: Diagnosis not present

## 2022-06-01 DIAGNOSIS — Z9181 History of falling: Secondary | ICD-10-CM | POA: Diagnosis not present

## 2022-06-01 DIAGNOSIS — M545 Low back pain, unspecified: Secondary | ICD-10-CM | POA: Diagnosis not present

## 2022-06-01 DIAGNOSIS — Z79899 Other long term (current) drug therapy: Secondary | ICD-10-CM | POA: Diagnosis not present

## 2022-06-01 DIAGNOSIS — M48061 Spinal stenosis, lumbar region without neurogenic claudication: Secondary | ICD-10-CM | POA: Diagnosis not present

## 2022-06-01 DIAGNOSIS — Z6841 Body Mass Index (BMI) 40.0 and over, adult: Secondary | ICD-10-CM | POA: Diagnosis not present

## 2022-06-01 DIAGNOSIS — R55 Syncope and collapse: Secondary | ICD-10-CM | POA: Diagnosis not present

## 2022-06-01 DIAGNOSIS — Z7951 Long term (current) use of inhaled steroids: Secondary | ICD-10-CM | POA: Diagnosis not present

## 2022-06-01 DIAGNOSIS — R600 Localized edema: Secondary | ICD-10-CM | POA: Diagnosis not present

## 2022-06-01 DIAGNOSIS — R001 Bradycardia, unspecified: Secondary | ICD-10-CM | POA: Diagnosis not present

## 2022-06-01 DIAGNOSIS — M7122 Synovial cyst of popliteal space [Baker], left knee: Secondary | ICD-10-CM | POA: Diagnosis not present

## 2022-06-01 DIAGNOSIS — M25552 Pain in left hip: Secondary | ICD-10-CM | POA: Diagnosis not present

## 2022-06-01 DIAGNOSIS — N179 Acute kidney failure, unspecified: Secondary | ICD-10-CM | POA: Diagnosis not present

## 2022-06-01 DIAGNOSIS — Z791 Long term (current) use of non-steroidal anti-inflammatories (NSAID): Secondary | ICD-10-CM | POA: Diagnosis not present

## 2022-06-01 DIAGNOSIS — Z79891 Long term (current) use of opiate analgesic: Secondary | ICD-10-CM | POA: Diagnosis not present

## 2022-06-01 DIAGNOSIS — Z9889 Other specified postprocedural states: Secondary | ICD-10-CM | POA: Diagnosis not present

## 2022-06-01 DIAGNOSIS — J449 Chronic obstructive pulmonary disease, unspecified: Secondary | ICD-10-CM | POA: Diagnosis not present

## 2022-06-01 DIAGNOSIS — S3992XA Unspecified injury of lower back, initial encounter: Secondary | ICD-10-CM | POA: Diagnosis not present

## 2022-06-01 DIAGNOSIS — M25551 Pain in right hip: Secondary | ICD-10-CM | POA: Diagnosis not present

## 2022-06-02 DIAGNOSIS — J449 Chronic obstructive pulmonary disease, unspecified: Secondary | ICD-10-CM | POA: Diagnosis not present

## 2022-06-02 DIAGNOSIS — M7122 Synovial cyst of popliteal space [Baker], left knee: Secondary | ICD-10-CM | POA: Diagnosis not present

## 2022-06-02 DIAGNOSIS — M48061 Spinal stenosis, lumbar region without neurogenic claudication: Secondary | ICD-10-CM | POA: Diagnosis not present

## 2022-06-02 DIAGNOSIS — E669 Obesity, unspecified: Secondary | ICD-10-CM | POA: Diagnosis not present

## 2022-06-02 DIAGNOSIS — R55 Syncope and collapse: Secondary | ICD-10-CM | POA: Diagnosis not present

## 2022-06-02 DIAGNOSIS — I080 Rheumatic disorders of both mitral and aortic valves: Secondary | ICD-10-CM | POA: Diagnosis not present

## 2022-06-02 DIAGNOSIS — R296 Repeated falls: Secondary | ICD-10-CM | POA: Diagnosis not present

## 2022-06-02 DIAGNOSIS — Z6841 Body Mass Index (BMI) 40.0 and over, adult: Secondary | ICD-10-CM | POA: Diagnosis not present

## 2022-06-02 DIAGNOSIS — N179 Acute kidney failure, unspecified: Secondary | ICD-10-CM | POA: Diagnosis not present

## 2022-06-02 DIAGNOSIS — M7732 Calcaneal spur, left foot: Secondary | ICD-10-CM | POA: Diagnosis not present

## 2022-06-02 DIAGNOSIS — G4733 Obstructive sleep apnea (adult) (pediatric): Secondary | ICD-10-CM | POA: Diagnosis not present

## 2022-06-02 DIAGNOSIS — D649 Anemia, unspecified: Secondary | ICD-10-CM | POA: Diagnosis not present

## 2022-06-02 DIAGNOSIS — R2681 Unsteadiness on feet: Secondary | ICD-10-CM | POA: Diagnosis not present

## 2022-06-02 DIAGNOSIS — R001 Bradycardia, unspecified: Secondary | ICD-10-CM | POA: Diagnosis not present

## 2022-06-02 DIAGNOSIS — Z955 Presence of coronary angioplasty implant and graft: Secondary | ICD-10-CM | POA: Diagnosis not present

## 2022-06-02 DIAGNOSIS — R600 Localized edema: Secondary | ICD-10-CM | POA: Diagnosis not present

## 2022-06-03 DIAGNOSIS — N179 Acute kidney failure, unspecified: Secondary | ICD-10-CM | POA: Diagnosis not present

## 2022-06-03 DIAGNOSIS — M48061 Spinal stenosis, lumbar region without neurogenic claudication: Secondary | ICD-10-CM | POA: Diagnosis not present

## 2022-06-03 DIAGNOSIS — R2681 Unsteadiness on feet: Secondary | ICD-10-CM | POA: Diagnosis not present

## 2022-06-03 DIAGNOSIS — Z955 Presence of coronary angioplasty implant and graft: Secondary | ICD-10-CM | POA: Diagnosis not present

## 2022-06-03 DIAGNOSIS — G4733 Obstructive sleep apnea (adult) (pediatric): Secondary | ICD-10-CM | POA: Diagnosis not present

## 2022-06-03 DIAGNOSIS — R296 Repeated falls: Secondary | ICD-10-CM | POA: Diagnosis not present

## 2022-06-03 DIAGNOSIS — J449 Chronic obstructive pulmonary disease, unspecified: Secondary | ICD-10-CM | POA: Diagnosis not present

## 2022-06-03 DIAGNOSIS — R001 Bradycardia, unspecified: Secondary | ICD-10-CM | POA: Diagnosis not present

## 2022-06-03 DIAGNOSIS — D649 Anemia, unspecified: Secondary | ICD-10-CM | POA: Diagnosis not present

## 2022-06-03 DIAGNOSIS — Z6841 Body Mass Index (BMI) 40.0 and over, adult: Secondary | ICD-10-CM | POA: Diagnosis not present

## 2022-06-03 DIAGNOSIS — R55 Syncope and collapse: Secondary | ICD-10-CM | POA: Diagnosis not present

## 2022-06-07 DIAGNOSIS — F419 Anxiety disorder, unspecified: Secondary | ICD-10-CM | POA: Diagnosis not present

## 2022-06-07 DIAGNOSIS — E785 Hyperlipidemia, unspecified: Secondary | ICD-10-CM | POA: Diagnosis not present

## 2022-06-07 DIAGNOSIS — M160 Bilateral primary osteoarthritis of hip: Secondary | ICD-10-CM | POA: Diagnosis not present

## 2022-06-07 DIAGNOSIS — K219 Gastro-esophageal reflux disease without esophagitis: Secondary | ICD-10-CM | POA: Diagnosis not present

## 2022-06-07 DIAGNOSIS — K581 Irritable bowel syndrome with constipation: Secondary | ICD-10-CM | POA: Diagnosis not present

## 2022-06-07 DIAGNOSIS — K449 Diaphragmatic hernia without obstruction or gangrene: Secondary | ICD-10-CM | POA: Diagnosis not present

## 2022-06-07 DIAGNOSIS — R001 Bradycardia, unspecified: Secondary | ICD-10-CM | POA: Diagnosis not present

## 2022-06-07 DIAGNOSIS — M48061 Spinal stenosis, lumbar region without neurogenic claudication: Secondary | ICD-10-CM | POA: Diagnosis not present

## 2022-06-07 DIAGNOSIS — F32A Depression, unspecified: Secondary | ICD-10-CM | POA: Diagnosis not present

## 2022-06-07 DIAGNOSIS — E559 Vitamin D deficiency, unspecified: Secondary | ICD-10-CM | POA: Diagnosis not present

## 2022-06-07 DIAGNOSIS — G43909 Migraine, unspecified, not intractable, without status migrainosus: Secondary | ICD-10-CM | POA: Diagnosis not present

## 2022-06-07 DIAGNOSIS — M47816 Spondylosis without myelopathy or radiculopathy, lumbar region: Secondary | ICD-10-CM | POA: Diagnosis not present

## 2022-06-07 DIAGNOSIS — E039 Hypothyroidism, unspecified: Secondary | ICD-10-CM | POA: Diagnosis not present

## 2022-06-07 DIAGNOSIS — M47812 Spondylosis without myelopathy or radiculopathy, cervical region: Secondary | ICD-10-CM | POA: Diagnosis not present

## 2022-06-07 DIAGNOSIS — J449 Chronic obstructive pulmonary disease, unspecified: Secondary | ICD-10-CM | POA: Diagnosis not present

## 2022-06-07 DIAGNOSIS — E1122 Type 2 diabetes mellitus with diabetic chronic kidney disease: Secondary | ICD-10-CM | POA: Diagnosis not present

## 2022-06-07 DIAGNOSIS — R413 Other amnesia: Secondary | ICD-10-CM | POA: Diagnosis not present

## 2022-06-07 DIAGNOSIS — N179 Acute kidney failure, unspecified: Secondary | ICD-10-CM | POA: Diagnosis not present

## 2022-06-07 DIAGNOSIS — I2510 Atherosclerotic heart disease of native coronary artery without angina pectoris: Secondary | ICD-10-CM | POA: Diagnosis not present

## 2022-06-07 DIAGNOSIS — D509 Iron deficiency anemia, unspecified: Secondary | ICD-10-CM | POA: Diagnosis not present

## 2022-06-07 DIAGNOSIS — N189 Chronic kidney disease, unspecified: Secondary | ICD-10-CM | POA: Diagnosis not present

## 2022-06-07 DIAGNOSIS — E1142 Type 2 diabetes mellitus with diabetic polyneuropathy: Secondary | ICD-10-CM | POA: Diagnosis not present

## 2022-06-07 DIAGNOSIS — I129 Hypertensive chronic kidney disease with stage 1 through stage 4 chronic kidney disease, or unspecified chronic kidney disease: Secondary | ICD-10-CM | POA: Diagnosis not present

## 2022-06-07 DIAGNOSIS — R569 Unspecified convulsions: Secondary | ICD-10-CM | POA: Diagnosis not present

## 2022-06-07 DIAGNOSIS — M109 Gout, unspecified: Secondary | ICD-10-CM | POA: Diagnosis not present

## 2022-06-09 DIAGNOSIS — E1122 Type 2 diabetes mellitus with diabetic chronic kidney disease: Secondary | ICD-10-CM | POA: Diagnosis not present

## 2022-06-09 DIAGNOSIS — R001 Bradycardia, unspecified: Secondary | ICD-10-CM | POA: Diagnosis not present

## 2022-06-09 DIAGNOSIS — I129 Hypertensive chronic kidney disease with stage 1 through stage 4 chronic kidney disease, or unspecified chronic kidney disease: Secondary | ICD-10-CM | POA: Diagnosis not present

## 2022-06-09 DIAGNOSIS — N189 Chronic kidney disease, unspecified: Secondary | ICD-10-CM | POA: Diagnosis not present

## 2022-06-09 DIAGNOSIS — I2510 Atherosclerotic heart disease of native coronary artery without angina pectoris: Secondary | ICD-10-CM | POA: Diagnosis not present

## 2022-06-09 DIAGNOSIS — N179 Acute kidney failure, unspecified: Secondary | ICD-10-CM | POA: Diagnosis not present

## 2022-06-12 DIAGNOSIS — E1122 Type 2 diabetes mellitus with diabetic chronic kidney disease: Secondary | ICD-10-CM | POA: Diagnosis not present

## 2022-06-12 DIAGNOSIS — N179 Acute kidney failure, unspecified: Secondary | ICD-10-CM | POA: Diagnosis not present

## 2022-06-12 DIAGNOSIS — I2510 Atherosclerotic heart disease of native coronary artery without angina pectoris: Secondary | ICD-10-CM | POA: Diagnosis not present

## 2022-06-12 DIAGNOSIS — I129 Hypertensive chronic kidney disease with stage 1 through stage 4 chronic kidney disease, or unspecified chronic kidney disease: Secondary | ICD-10-CM | POA: Diagnosis not present

## 2022-06-12 DIAGNOSIS — R001 Bradycardia, unspecified: Secondary | ICD-10-CM | POA: Diagnosis not present

## 2022-06-12 DIAGNOSIS — N189 Chronic kidney disease, unspecified: Secondary | ICD-10-CM | POA: Diagnosis not present

## 2022-06-16 DIAGNOSIS — I2510 Atherosclerotic heart disease of native coronary artery without angina pectoris: Secondary | ICD-10-CM | POA: Diagnosis not present

## 2022-06-16 DIAGNOSIS — I129 Hypertensive chronic kidney disease with stage 1 through stage 4 chronic kidney disease, or unspecified chronic kidney disease: Secondary | ICD-10-CM | POA: Diagnosis not present

## 2022-06-16 DIAGNOSIS — N179 Acute kidney failure, unspecified: Secondary | ICD-10-CM | POA: Diagnosis not present

## 2022-06-16 DIAGNOSIS — E1122 Type 2 diabetes mellitus with diabetic chronic kidney disease: Secondary | ICD-10-CM | POA: Diagnosis not present

## 2022-06-16 DIAGNOSIS — N189 Chronic kidney disease, unspecified: Secondary | ICD-10-CM | POA: Diagnosis not present

## 2022-06-16 DIAGNOSIS — R001 Bradycardia, unspecified: Secondary | ICD-10-CM | POA: Diagnosis not present

## 2022-06-19 DIAGNOSIS — N179 Acute kidney failure, unspecified: Secondary | ICD-10-CM | POA: Diagnosis not present

## 2022-06-19 DIAGNOSIS — E1122 Type 2 diabetes mellitus with diabetic chronic kidney disease: Secondary | ICD-10-CM | POA: Diagnosis not present

## 2022-06-19 DIAGNOSIS — I2510 Atherosclerotic heart disease of native coronary artery without angina pectoris: Secondary | ICD-10-CM | POA: Diagnosis not present

## 2022-06-19 DIAGNOSIS — I129 Hypertensive chronic kidney disease with stage 1 through stage 4 chronic kidney disease, or unspecified chronic kidney disease: Secondary | ICD-10-CM | POA: Diagnosis not present

## 2022-06-19 DIAGNOSIS — N189 Chronic kidney disease, unspecified: Secondary | ICD-10-CM | POA: Diagnosis not present

## 2022-06-19 DIAGNOSIS — R001 Bradycardia, unspecified: Secondary | ICD-10-CM | POA: Diagnosis not present

## 2022-06-23 DIAGNOSIS — L03032 Cellulitis of left toe: Secondary | ICD-10-CM | POA: Diagnosis not present

## 2022-06-23 DIAGNOSIS — I83893 Varicose veins of bilateral lower extremities with other complications: Secondary | ICD-10-CM | POA: Diagnosis not present

## 2022-06-23 DIAGNOSIS — E1142 Type 2 diabetes mellitus with diabetic polyneuropathy: Secondary | ICD-10-CM | POA: Diagnosis not present

## 2022-06-23 DIAGNOSIS — M79675 Pain in left toe(s): Secondary | ICD-10-CM | POA: Diagnosis not present

## 2022-06-23 DIAGNOSIS — L600 Ingrowing nail: Secondary | ICD-10-CM | POA: Diagnosis not present

## 2022-06-24 DIAGNOSIS — I2510 Atherosclerotic heart disease of native coronary artery without angina pectoris: Secondary | ICD-10-CM | POA: Diagnosis not present

## 2022-06-24 DIAGNOSIS — I129 Hypertensive chronic kidney disease with stage 1 through stage 4 chronic kidney disease, or unspecified chronic kidney disease: Secondary | ICD-10-CM | POA: Diagnosis not present

## 2022-06-24 DIAGNOSIS — N179 Acute kidney failure, unspecified: Secondary | ICD-10-CM | POA: Diagnosis not present

## 2022-06-24 DIAGNOSIS — N189 Chronic kidney disease, unspecified: Secondary | ICD-10-CM | POA: Diagnosis not present

## 2022-06-24 DIAGNOSIS — E1122 Type 2 diabetes mellitus with diabetic chronic kidney disease: Secondary | ICD-10-CM | POA: Diagnosis not present

## 2022-06-24 DIAGNOSIS — R001 Bradycardia, unspecified: Secondary | ICD-10-CM | POA: Diagnosis not present

## 2022-06-25 DIAGNOSIS — I2510 Atherosclerotic heart disease of native coronary artery without angina pectoris: Secondary | ICD-10-CM | POA: Diagnosis not present

## 2022-06-25 DIAGNOSIS — E782 Mixed hyperlipidemia: Secondary | ICD-10-CM | POA: Diagnosis not present

## 2022-06-25 DIAGNOSIS — N1831 Chronic kidney disease, stage 3a: Secondary | ICD-10-CM | POA: Diagnosis not present

## 2022-06-25 DIAGNOSIS — E039 Hypothyroidism, unspecified: Secondary | ICD-10-CM | POA: Diagnosis not present

## 2022-06-25 DIAGNOSIS — Z7409 Other reduced mobility: Secondary | ICD-10-CM | POA: Diagnosis not present

## 2022-06-25 DIAGNOSIS — J410 Simple chronic bronchitis: Secondary | ICD-10-CM | POA: Diagnosis not present

## 2022-06-25 DIAGNOSIS — E1165 Type 2 diabetes mellitus with hyperglycemia: Secondary | ICD-10-CM | POA: Diagnosis not present

## 2022-06-25 DIAGNOSIS — R296 Repeated falls: Secondary | ICD-10-CM | POA: Diagnosis not present

## 2022-06-25 DIAGNOSIS — M791 Myalgia, unspecified site: Secondary | ICD-10-CM | POA: Diagnosis not present

## 2022-06-25 DIAGNOSIS — I129 Hypertensive chronic kidney disease with stage 1 through stage 4 chronic kidney disease, or unspecified chronic kidney disease: Secondary | ICD-10-CM | POA: Diagnosis not present

## 2022-06-25 DIAGNOSIS — M5441 Lumbago with sciatica, right side: Secondary | ICD-10-CM | POA: Diagnosis not present

## 2022-06-25 DIAGNOSIS — M159 Polyosteoarthritis, unspecified: Secondary | ICD-10-CM | POA: Diagnosis not present

## 2022-06-26 DIAGNOSIS — E1122 Type 2 diabetes mellitus with diabetic chronic kidney disease: Secondary | ICD-10-CM | POA: Diagnosis not present

## 2022-06-26 DIAGNOSIS — N189 Chronic kidney disease, unspecified: Secondary | ICD-10-CM | POA: Diagnosis not present

## 2022-06-26 DIAGNOSIS — I129 Hypertensive chronic kidney disease with stage 1 through stage 4 chronic kidney disease, or unspecified chronic kidney disease: Secondary | ICD-10-CM | POA: Diagnosis not present

## 2022-06-26 DIAGNOSIS — I2510 Atherosclerotic heart disease of native coronary artery without angina pectoris: Secondary | ICD-10-CM | POA: Diagnosis not present

## 2022-06-26 DIAGNOSIS — N179 Acute kidney failure, unspecified: Secondary | ICD-10-CM | POA: Diagnosis not present

## 2022-06-26 DIAGNOSIS — R001 Bradycardia, unspecified: Secondary | ICD-10-CM | POA: Diagnosis not present

## 2022-07-03 DIAGNOSIS — I2510 Atherosclerotic heart disease of native coronary artery without angina pectoris: Secondary | ICD-10-CM | POA: Diagnosis not present

## 2022-07-03 DIAGNOSIS — N189 Chronic kidney disease, unspecified: Secondary | ICD-10-CM | POA: Diagnosis not present

## 2022-07-03 DIAGNOSIS — I129 Hypertensive chronic kidney disease with stage 1 through stage 4 chronic kidney disease, or unspecified chronic kidney disease: Secondary | ICD-10-CM | POA: Diagnosis not present

## 2022-07-03 DIAGNOSIS — F4310 Post-traumatic stress disorder, unspecified: Secondary | ICD-10-CM | POA: Diagnosis not present

## 2022-07-03 DIAGNOSIS — R001 Bradycardia, unspecified: Secondary | ICD-10-CM | POA: Diagnosis not present

## 2022-07-03 DIAGNOSIS — N179 Acute kidney failure, unspecified: Secondary | ICD-10-CM | POA: Diagnosis not present

## 2022-07-03 DIAGNOSIS — F331 Major depressive disorder, recurrent, moderate: Secondary | ICD-10-CM | POA: Diagnosis not present

## 2022-07-03 DIAGNOSIS — E1122 Type 2 diabetes mellitus with diabetic chronic kidney disease: Secondary | ICD-10-CM | POA: Diagnosis not present

## 2022-07-03 DIAGNOSIS — G4700 Insomnia, unspecified: Secondary | ICD-10-CM | POA: Diagnosis not present

## 2022-07-03 DIAGNOSIS — F419 Anxiety disorder, unspecified: Secondary | ICD-10-CM | POA: Diagnosis not present

## 2022-07-07 DIAGNOSIS — R413 Other amnesia: Secondary | ICD-10-CM | POA: Diagnosis not present

## 2022-07-07 DIAGNOSIS — G43909 Migraine, unspecified, not intractable, without status migrainosus: Secondary | ICD-10-CM | POA: Diagnosis not present

## 2022-07-07 DIAGNOSIS — M48061 Spinal stenosis, lumbar region without neurogenic claudication: Secondary | ICD-10-CM | POA: Diagnosis not present

## 2022-07-07 DIAGNOSIS — K219 Gastro-esophageal reflux disease without esophagitis: Secondary | ICD-10-CM | POA: Diagnosis not present

## 2022-07-07 DIAGNOSIS — R569 Unspecified convulsions: Secondary | ICD-10-CM | POA: Diagnosis not present

## 2022-07-07 DIAGNOSIS — N189 Chronic kidney disease, unspecified: Secondary | ICD-10-CM | POA: Diagnosis not present

## 2022-07-07 DIAGNOSIS — K449 Diaphragmatic hernia without obstruction or gangrene: Secondary | ICD-10-CM | POA: Diagnosis not present

## 2022-07-07 DIAGNOSIS — E039 Hypothyroidism, unspecified: Secondary | ICD-10-CM | POA: Diagnosis not present

## 2022-07-07 DIAGNOSIS — F32A Depression, unspecified: Secondary | ICD-10-CM | POA: Diagnosis not present

## 2022-07-07 DIAGNOSIS — N179 Acute kidney failure, unspecified: Secondary | ICD-10-CM | POA: Diagnosis not present

## 2022-07-07 DIAGNOSIS — M47812 Spondylosis without myelopathy or radiculopathy, cervical region: Secondary | ICD-10-CM | POA: Diagnosis not present

## 2022-07-07 DIAGNOSIS — M109 Gout, unspecified: Secondary | ICD-10-CM | POA: Diagnosis not present

## 2022-07-07 DIAGNOSIS — J449 Chronic obstructive pulmonary disease, unspecified: Secondary | ICD-10-CM | POA: Diagnosis not present

## 2022-07-07 DIAGNOSIS — I129 Hypertensive chronic kidney disease with stage 1 through stage 4 chronic kidney disease, or unspecified chronic kidney disease: Secondary | ICD-10-CM | POA: Diagnosis not present

## 2022-07-07 DIAGNOSIS — I2510 Atherosclerotic heart disease of native coronary artery without angina pectoris: Secondary | ICD-10-CM | POA: Diagnosis not present

## 2022-07-07 DIAGNOSIS — E785 Hyperlipidemia, unspecified: Secondary | ICD-10-CM | POA: Diagnosis not present

## 2022-07-07 DIAGNOSIS — D509 Iron deficiency anemia, unspecified: Secondary | ICD-10-CM | POA: Diagnosis not present

## 2022-07-07 DIAGNOSIS — K581 Irritable bowel syndrome with constipation: Secondary | ICD-10-CM | POA: Diagnosis not present

## 2022-07-07 DIAGNOSIS — M47816 Spondylosis without myelopathy or radiculopathy, lumbar region: Secondary | ICD-10-CM | POA: Diagnosis not present

## 2022-07-07 DIAGNOSIS — R001 Bradycardia, unspecified: Secondary | ICD-10-CM | POA: Diagnosis not present

## 2022-07-07 DIAGNOSIS — E559 Vitamin D deficiency, unspecified: Secondary | ICD-10-CM | POA: Diagnosis not present

## 2022-07-07 DIAGNOSIS — E1142 Type 2 diabetes mellitus with diabetic polyneuropathy: Secondary | ICD-10-CM | POA: Diagnosis not present

## 2022-07-07 DIAGNOSIS — F419 Anxiety disorder, unspecified: Secondary | ICD-10-CM | POA: Diagnosis not present

## 2022-07-07 DIAGNOSIS — E1122 Type 2 diabetes mellitus with diabetic chronic kidney disease: Secondary | ICD-10-CM | POA: Diagnosis not present

## 2022-07-07 DIAGNOSIS — M160 Bilateral primary osteoarthritis of hip: Secondary | ICD-10-CM | POA: Diagnosis not present

## 2022-07-08 DIAGNOSIS — I129 Hypertensive chronic kidney disease with stage 1 through stage 4 chronic kidney disease, or unspecified chronic kidney disease: Secondary | ICD-10-CM | POA: Diagnosis not present

## 2022-07-08 DIAGNOSIS — N189 Chronic kidney disease, unspecified: Secondary | ICD-10-CM | POA: Diagnosis not present

## 2022-07-08 DIAGNOSIS — E1122 Type 2 diabetes mellitus with diabetic chronic kidney disease: Secondary | ICD-10-CM | POA: Diagnosis not present

## 2022-07-08 DIAGNOSIS — I2510 Atherosclerotic heart disease of native coronary artery without angina pectoris: Secondary | ICD-10-CM | POA: Diagnosis not present

## 2022-07-08 DIAGNOSIS — N179 Acute kidney failure, unspecified: Secondary | ICD-10-CM | POA: Diagnosis not present

## 2022-07-08 DIAGNOSIS — R001 Bradycardia, unspecified: Secondary | ICD-10-CM | POA: Diagnosis not present

## 2022-07-10 DIAGNOSIS — R001 Bradycardia, unspecified: Secondary | ICD-10-CM | POA: Diagnosis not present

## 2022-07-10 DIAGNOSIS — I129 Hypertensive chronic kidney disease with stage 1 through stage 4 chronic kidney disease, or unspecified chronic kidney disease: Secondary | ICD-10-CM | POA: Diagnosis not present

## 2022-07-10 DIAGNOSIS — E1122 Type 2 diabetes mellitus with diabetic chronic kidney disease: Secondary | ICD-10-CM | POA: Diagnosis not present

## 2022-07-10 DIAGNOSIS — I2510 Atherosclerotic heart disease of native coronary artery without angina pectoris: Secondary | ICD-10-CM | POA: Diagnosis not present

## 2022-07-10 DIAGNOSIS — N189 Chronic kidney disease, unspecified: Secondary | ICD-10-CM | POA: Diagnosis not present

## 2022-07-10 DIAGNOSIS — N179 Acute kidney failure, unspecified: Secondary | ICD-10-CM | POA: Diagnosis not present

## 2022-07-15 DIAGNOSIS — I129 Hypertensive chronic kidney disease with stage 1 through stage 4 chronic kidney disease, or unspecified chronic kidney disease: Secondary | ICD-10-CM | POA: Diagnosis not present

## 2022-07-15 DIAGNOSIS — N179 Acute kidney failure, unspecified: Secondary | ICD-10-CM | POA: Diagnosis not present

## 2022-07-15 DIAGNOSIS — I2510 Atherosclerotic heart disease of native coronary artery without angina pectoris: Secondary | ICD-10-CM | POA: Diagnosis not present

## 2022-07-15 DIAGNOSIS — E1122 Type 2 diabetes mellitus with diabetic chronic kidney disease: Secondary | ICD-10-CM | POA: Diagnosis not present

## 2022-07-15 DIAGNOSIS — R001 Bradycardia, unspecified: Secondary | ICD-10-CM | POA: Diagnosis not present

## 2022-07-15 DIAGNOSIS — N189 Chronic kidney disease, unspecified: Secondary | ICD-10-CM | POA: Diagnosis not present

## 2022-07-21 DIAGNOSIS — E1142 Type 2 diabetes mellitus with diabetic polyneuropathy: Secondary | ICD-10-CM | POA: Diagnosis not present

## 2022-07-21 DIAGNOSIS — L600 Ingrowing nail: Secondary | ICD-10-CM | POA: Diagnosis not present

## 2022-07-21 DIAGNOSIS — I83893 Varicose veins of bilateral lower extremities with other complications: Secondary | ICD-10-CM | POA: Diagnosis not present

## 2022-07-21 DIAGNOSIS — T25632D Corrosion of second degree of left toe(s) (nail), subsequent encounter: Secondary | ICD-10-CM | POA: Diagnosis not present

## 2022-07-24 DIAGNOSIS — M79605 Pain in left leg: Secondary | ICD-10-CM | POA: Diagnosis not present

## 2022-07-24 DIAGNOSIS — M79604 Pain in right leg: Secondary | ICD-10-CM | POA: Diagnosis not present

## 2022-07-29 DIAGNOSIS — E1122 Type 2 diabetes mellitus with diabetic chronic kidney disease: Secondary | ICD-10-CM | POA: Diagnosis not present

## 2022-07-29 DIAGNOSIS — N179 Acute kidney failure, unspecified: Secondary | ICD-10-CM | POA: Diagnosis not present

## 2022-07-29 DIAGNOSIS — I129 Hypertensive chronic kidney disease with stage 1 through stage 4 chronic kidney disease, or unspecified chronic kidney disease: Secondary | ICD-10-CM | POA: Diagnosis not present

## 2022-07-29 DIAGNOSIS — N189 Chronic kidney disease, unspecified: Secondary | ICD-10-CM | POA: Diagnosis not present

## 2022-07-29 DIAGNOSIS — R001 Bradycardia, unspecified: Secondary | ICD-10-CM | POA: Diagnosis not present

## 2022-07-29 DIAGNOSIS — I2510 Atherosclerotic heart disease of native coronary artery without angina pectoris: Secondary | ICD-10-CM | POA: Diagnosis not present

## 2022-07-31 DIAGNOSIS — M47816 Spondylosis without myelopathy or radiculopathy, lumbar region: Secondary | ICD-10-CM | POA: Diagnosis not present

## 2022-07-31 DIAGNOSIS — M461 Sacroiliitis, not elsewhere classified: Secondary | ICD-10-CM | POA: Diagnosis not present

## 2022-07-31 DIAGNOSIS — M961 Postlaminectomy syndrome, not elsewhere classified: Secondary | ICD-10-CM | POA: Diagnosis not present

## 2022-07-31 DIAGNOSIS — M5136 Other intervertebral disc degeneration, lumbar region: Secondary | ICD-10-CM | POA: Diagnosis not present

## 2022-07-31 DIAGNOSIS — Z79891 Long term (current) use of opiate analgesic: Secondary | ICD-10-CM | POA: Diagnosis not present

## 2022-08-05 DIAGNOSIS — N189 Chronic kidney disease, unspecified: Secondary | ICD-10-CM | POA: Diagnosis not present

## 2022-08-05 DIAGNOSIS — N179 Acute kidney failure, unspecified: Secondary | ICD-10-CM | POA: Diagnosis not present

## 2022-08-05 DIAGNOSIS — R001 Bradycardia, unspecified: Secondary | ICD-10-CM | POA: Diagnosis not present

## 2022-08-05 DIAGNOSIS — E1122 Type 2 diabetes mellitus with diabetic chronic kidney disease: Secondary | ICD-10-CM | POA: Diagnosis not present

## 2022-08-05 DIAGNOSIS — I129 Hypertensive chronic kidney disease with stage 1 through stage 4 chronic kidney disease, or unspecified chronic kidney disease: Secondary | ICD-10-CM | POA: Diagnosis not present

## 2022-08-05 DIAGNOSIS — I2510 Atherosclerotic heart disease of native coronary artery without angina pectoris: Secondary | ICD-10-CM | POA: Diagnosis not present

## 2022-08-06 DIAGNOSIS — K581 Irritable bowel syndrome with constipation: Secondary | ICD-10-CM | POA: Diagnosis not present

## 2022-08-06 DIAGNOSIS — R001 Bradycardia, unspecified: Secondary | ICD-10-CM | POA: Diagnosis not present

## 2022-08-06 DIAGNOSIS — F32A Depression, unspecified: Secondary | ICD-10-CM | POA: Diagnosis not present

## 2022-08-06 DIAGNOSIS — F419 Anxiety disorder, unspecified: Secondary | ICD-10-CM | POA: Diagnosis not present

## 2022-08-06 DIAGNOSIS — J449 Chronic obstructive pulmonary disease, unspecified: Secondary | ICD-10-CM | POA: Diagnosis not present

## 2022-08-06 DIAGNOSIS — E039 Hypothyroidism, unspecified: Secondary | ICD-10-CM | POA: Diagnosis not present

## 2022-08-06 DIAGNOSIS — M47812 Spondylosis without myelopathy or radiculopathy, cervical region: Secondary | ICD-10-CM | POA: Diagnosis not present

## 2022-08-06 DIAGNOSIS — I2510 Atherosclerotic heart disease of native coronary artery without angina pectoris: Secondary | ICD-10-CM | POA: Diagnosis not present

## 2022-08-06 DIAGNOSIS — K219 Gastro-esophageal reflux disease without esophagitis: Secondary | ICD-10-CM | POA: Diagnosis not present

## 2022-08-06 DIAGNOSIS — G43909 Migraine, unspecified, not intractable, without status migrainosus: Secondary | ICD-10-CM | POA: Diagnosis not present

## 2022-08-06 DIAGNOSIS — M48061 Spinal stenosis, lumbar region without neurogenic claudication: Secondary | ICD-10-CM | POA: Diagnosis not present

## 2022-08-06 DIAGNOSIS — K449 Diaphragmatic hernia without obstruction or gangrene: Secondary | ICD-10-CM | POA: Diagnosis not present

## 2022-08-06 DIAGNOSIS — R569 Unspecified convulsions: Secondary | ICD-10-CM | POA: Diagnosis not present

## 2022-08-06 DIAGNOSIS — I129 Hypertensive chronic kidney disease with stage 1 through stage 4 chronic kidney disease, or unspecified chronic kidney disease: Secondary | ICD-10-CM | POA: Diagnosis not present

## 2022-08-06 DIAGNOSIS — R413 Other amnesia: Secondary | ICD-10-CM | POA: Diagnosis not present

## 2022-08-06 DIAGNOSIS — E559 Vitamin D deficiency, unspecified: Secondary | ICD-10-CM | POA: Diagnosis not present

## 2022-08-06 DIAGNOSIS — M160 Bilateral primary osteoarthritis of hip: Secondary | ICD-10-CM | POA: Diagnosis not present

## 2022-08-06 DIAGNOSIS — M109 Gout, unspecified: Secondary | ICD-10-CM | POA: Diagnosis not present

## 2022-08-06 DIAGNOSIS — E1122 Type 2 diabetes mellitus with diabetic chronic kidney disease: Secondary | ICD-10-CM | POA: Diagnosis not present

## 2022-08-06 DIAGNOSIS — N189 Chronic kidney disease, unspecified: Secondary | ICD-10-CM | POA: Diagnosis not present

## 2022-08-06 DIAGNOSIS — M47816 Spondylosis without myelopathy or radiculopathy, lumbar region: Secondary | ICD-10-CM | POA: Diagnosis not present

## 2022-08-06 DIAGNOSIS — N179 Acute kidney failure, unspecified: Secondary | ICD-10-CM | POA: Diagnosis not present

## 2022-08-06 DIAGNOSIS — E785 Hyperlipidemia, unspecified: Secondary | ICD-10-CM | POA: Diagnosis not present

## 2022-08-06 DIAGNOSIS — E1142 Type 2 diabetes mellitus with diabetic polyneuropathy: Secondary | ICD-10-CM | POA: Diagnosis not present

## 2022-08-06 DIAGNOSIS — D509 Iron deficiency anemia, unspecified: Secondary | ICD-10-CM | POA: Diagnosis not present

## 2022-08-14 DIAGNOSIS — R2689 Other abnormalities of gait and mobility: Secondary | ICD-10-CM | POA: Diagnosis not present

## 2022-08-14 DIAGNOSIS — R569 Unspecified convulsions: Secondary | ICD-10-CM | POA: Diagnosis not present

## 2022-08-14 DIAGNOSIS — R42 Dizziness and giddiness: Secondary | ICD-10-CM | POA: Diagnosis not present

## 2022-08-14 DIAGNOSIS — R002 Palpitations: Secondary | ICD-10-CM | POA: Diagnosis not present

## 2022-08-14 DIAGNOSIS — R296 Repeated falls: Secondary | ICD-10-CM | POA: Diagnosis not present

## 2022-08-14 DIAGNOSIS — Z79899 Other long term (current) drug therapy: Secondary | ICD-10-CM | POA: Diagnosis not present

## 2022-08-18 DIAGNOSIS — M47816 Spondylosis without myelopathy or radiculopathy, lumbar region: Secondary | ICD-10-CM | POA: Diagnosis not present

## 2022-08-18 DIAGNOSIS — M5136 Other intervertebral disc degeneration, lumbar region: Secondary | ICD-10-CM | POA: Diagnosis not present

## 2022-08-18 DIAGNOSIS — M461 Sacroiliitis, not elsewhere classified: Secondary | ICD-10-CM | POA: Diagnosis not present

## 2022-08-18 DIAGNOSIS — Z79891 Long term (current) use of opiate analgesic: Secondary | ICD-10-CM | POA: Diagnosis not present

## 2022-08-18 DIAGNOSIS — M961 Postlaminectomy syndrome, not elsewhere classified: Secondary | ICD-10-CM | POA: Diagnosis not present

## 2022-08-22 DIAGNOSIS — E1142 Type 2 diabetes mellitus with diabetic polyneuropathy: Secondary | ICD-10-CM | POA: Diagnosis not present

## 2022-08-22 DIAGNOSIS — L600 Ingrowing nail: Secondary | ICD-10-CM | POA: Diagnosis not present

## 2022-08-22 DIAGNOSIS — N189 Chronic kidney disease, unspecified: Secondary | ICD-10-CM | POA: Diagnosis not present

## 2022-08-22 DIAGNOSIS — I2510 Atherosclerotic heart disease of native coronary artery without angina pectoris: Secondary | ICD-10-CM | POA: Diagnosis not present

## 2022-08-22 DIAGNOSIS — S8012XA Contusion of left lower leg, initial encounter: Secondary | ICD-10-CM | POA: Diagnosis not present

## 2022-08-22 DIAGNOSIS — I83893 Varicose veins of bilateral lower extremities with other complications: Secondary | ICD-10-CM | POA: Diagnosis not present

## 2022-08-22 DIAGNOSIS — T25632D Corrosion of second degree of left toe(s) (nail), subsequent encounter: Secondary | ICD-10-CM | POA: Diagnosis not present

## 2022-08-22 DIAGNOSIS — I129 Hypertensive chronic kidney disease with stage 1 through stage 4 chronic kidney disease, or unspecified chronic kidney disease: Secondary | ICD-10-CM | POA: Diagnosis not present

## 2022-08-22 DIAGNOSIS — N179 Acute kidney failure, unspecified: Secondary | ICD-10-CM | POA: Diagnosis not present

## 2022-08-22 DIAGNOSIS — R001 Bradycardia, unspecified: Secondary | ICD-10-CM | POA: Diagnosis not present

## 2022-08-22 DIAGNOSIS — E1122 Type 2 diabetes mellitus with diabetic chronic kidney disease: Secondary | ICD-10-CM | POA: Diagnosis not present

## 2022-08-26 DIAGNOSIS — M47816 Spondylosis without myelopathy or radiculopathy, lumbar region: Secondary | ICD-10-CM | POA: Diagnosis not present

## 2022-08-26 DIAGNOSIS — M4807 Spinal stenosis, lumbosacral region: Secondary | ICD-10-CM | POA: Diagnosis not present

## 2022-08-26 DIAGNOSIS — Z981 Arthrodesis status: Secondary | ICD-10-CM | POA: Diagnosis not present

## 2022-08-26 DIAGNOSIS — M5186 Other intervertebral disc disorders, lumbar region: Secondary | ICD-10-CM | POA: Diagnosis not present

## 2022-08-26 DIAGNOSIS — M5136 Other intervertebral disc degeneration, lumbar region: Secondary | ICD-10-CM | POA: Diagnosis not present

## 2022-08-29 DIAGNOSIS — D500 Iron deficiency anemia secondary to blood loss (chronic): Secondary | ICD-10-CM | POA: Diagnosis not present

## 2022-09-02 DIAGNOSIS — Z7189 Other specified counseling: Secondary | ICD-10-CM | POA: Diagnosis not present

## 2022-09-02 DIAGNOSIS — E1165 Type 2 diabetes mellitus with hyperglycemia: Secondary | ICD-10-CM | POA: Diagnosis not present

## 2022-09-02 DIAGNOSIS — I2510 Atherosclerotic heart disease of native coronary artery without angina pectoris: Secondary | ICD-10-CM | POA: Diagnosis not present

## 2022-09-02 DIAGNOSIS — R413 Other amnesia: Secondary | ICD-10-CM | POA: Diagnosis not present

## 2022-09-05 DIAGNOSIS — R918 Other nonspecific abnormal finding of lung field: Secondary | ICD-10-CM | POA: Diagnosis not present

## 2022-09-05 DIAGNOSIS — R590 Localized enlarged lymph nodes: Secondary | ICD-10-CM | POA: Diagnosis not present

## 2022-09-05 DIAGNOSIS — R937 Abnormal findings on diagnostic imaging of other parts of musculoskeletal system: Secondary | ICD-10-CM | POA: Diagnosis not present

## 2022-09-13 DIAGNOSIS — Z87891 Personal history of nicotine dependence: Secondary | ICD-10-CM | POA: Diagnosis not present

## 2022-09-13 DIAGNOSIS — R296 Repeated falls: Secondary | ICD-10-CM | POA: Diagnosis not present

## 2022-09-13 DIAGNOSIS — G8911 Acute pain due to trauma: Secondary | ICD-10-CM | POA: Diagnosis not present

## 2022-09-13 DIAGNOSIS — M25551 Pain in right hip: Secondary | ICD-10-CM | POA: Diagnosis not present

## 2022-09-13 DIAGNOSIS — R531 Weakness: Secondary | ICD-10-CM | POA: Diagnosis not present

## 2022-09-13 DIAGNOSIS — Z888 Allergy status to other drugs, medicaments and biological substances status: Secondary | ICD-10-CM | POA: Diagnosis not present

## 2022-09-13 DIAGNOSIS — Z9181 History of falling: Secondary | ICD-10-CM | POA: Diagnosis not present

## 2022-09-13 DIAGNOSIS — Z881 Allergy status to other antibiotic agents status: Secondary | ICD-10-CM | POA: Diagnosis not present

## 2022-09-13 DIAGNOSIS — Z880 Allergy status to penicillin: Secondary | ICD-10-CM | POA: Diagnosis not present

## 2022-09-13 DIAGNOSIS — Z91048 Other nonmedicinal substance allergy status: Secondary | ICD-10-CM | POA: Diagnosis not present

## 2022-09-13 DIAGNOSIS — M545 Low back pain, unspecified: Secondary | ICD-10-CM | POA: Diagnosis not present

## 2022-09-13 DIAGNOSIS — M25552 Pain in left hip: Secondary | ICD-10-CM | POA: Diagnosis not present

## 2022-09-29 DIAGNOSIS — N1831 Chronic kidney disease, stage 3a: Secondary | ICD-10-CM | POA: Diagnosis not present

## 2022-09-29 DIAGNOSIS — R051 Acute cough: Secondary | ICD-10-CM | POA: Diagnosis not present

## 2022-09-29 DIAGNOSIS — R296 Repeated falls: Secondary | ICD-10-CM | POA: Diagnosis not present

## 2022-09-29 DIAGNOSIS — R413 Other amnesia: Secondary | ICD-10-CM | POA: Diagnosis not present

## 2022-09-29 DIAGNOSIS — E1165 Type 2 diabetes mellitus with hyperglycemia: Secondary | ICD-10-CM | POA: Diagnosis not present

## 2022-09-29 DIAGNOSIS — D649 Anemia, unspecified: Secondary | ICD-10-CM | POA: Diagnosis not present

## 2022-09-29 DIAGNOSIS — E1142 Type 2 diabetes mellitus with diabetic polyneuropathy: Secondary | ICD-10-CM | POA: Diagnosis not present

## 2022-09-29 DIAGNOSIS — E538 Deficiency of other specified B group vitamins: Secondary | ICD-10-CM | POA: Diagnosis not present

## 2022-09-29 DIAGNOSIS — Z23 Encounter for immunization: Secondary | ICD-10-CM | POA: Diagnosis not present

## 2022-09-29 DIAGNOSIS — Z7189 Other specified counseling: Secondary | ICD-10-CM | POA: Diagnosis not present

## 2022-09-29 DIAGNOSIS — E079 Disorder of thyroid, unspecified: Secondary | ICD-10-CM | POA: Diagnosis not present

## 2022-09-29 DIAGNOSIS — R0789 Other chest pain: Secondary | ICD-10-CM | POA: Diagnosis not present

## 2022-09-29 DIAGNOSIS — R350 Frequency of micturition: Secondary | ICD-10-CM | POA: Diagnosis not present

## 2022-09-29 DIAGNOSIS — Z7409 Other reduced mobility: Secondary | ICD-10-CM | POA: Diagnosis not present

## 2022-09-29 DIAGNOSIS — I129 Hypertensive chronic kidney disease with stage 1 through stage 4 chronic kidney disease, or unspecified chronic kidney disease: Secondary | ICD-10-CM | POA: Diagnosis not present

## 2022-10-01 DIAGNOSIS — M5136 Other intervertebral disc degeneration, lumbar region: Secondary | ICD-10-CM | POA: Diagnosis not present

## 2022-10-01 DIAGNOSIS — M961 Postlaminectomy syndrome, not elsewhere classified: Secondary | ICD-10-CM | POA: Diagnosis not present

## 2022-10-01 DIAGNOSIS — M461 Sacroiliitis, not elsewhere classified: Secondary | ICD-10-CM | POA: Diagnosis not present

## 2022-10-01 DIAGNOSIS — M47816 Spondylosis without myelopathy or radiculopathy, lumbar region: Secondary | ICD-10-CM | POA: Diagnosis not present

## 2022-10-01 DIAGNOSIS — Z79891 Long term (current) use of opiate analgesic: Secondary | ICD-10-CM | POA: Diagnosis not present

## 2022-10-02 DIAGNOSIS — F4310 Post-traumatic stress disorder, unspecified: Secondary | ICD-10-CM | POA: Diagnosis not present

## 2022-10-02 DIAGNOSIS — F419 Anxiety disorder, unspecified: Secondary | ICD-10-CM | POA: Diagnosis not present

## 2022-10-02 DIAGNOSIS — G4700 Insomnia, unspecified: Secondary | ICD-10-CM | POA: Diagnosis not present

## 2022-10-02 DIAGNOSIS — F331 Major depressive disorder, recurrent, moderate: Secondary | ICD-10-CM | POA: Diagnosis not present

## 2022-10-03 DIAGNOSIS — E079 Disorder of thyroid, unspecified: Secondary | ICD-10-CM | POA: Diagnosis not present

## 2022-10-03 DIAGNOSIS — E538 Deficiency of other specified B group vitamins: Secondary | ICD-10-CM | POA: Diagnosis not present

## 2022-10-03 DIAGNOSIS — D649 Anemia, unspecified: Secondary | ICD-10-CM | POA: Diagnosis not present

## 2022-10-03 DIAGNOSIS — R051 Acute cough: Secondary | ICD-10-CM | POA: Diagnosis not present

## 2022-10-03 DIAGNOSIS — N1831 Chronic kidney disease, stage 3a: Secondary | ICD-10-CM | POA: Diagnosis not present

## 2022-10-09 DIAGNOSIS — R413 Other amnesia: Secondary | ICD-10-CM | POA: Diagnosis not present

## 2022-10-09 DIAGNOSIS — Z820 Family history of epilepsy and other diseases of the nervous system: Secondary | ICD-10-CM | POA: Diagnosis not present

## 2022-10-09 DIAGNOSIS — R9082 White matter disease, unspecified: Secondary | ICD-10-CM | POA: Diagnosis not present

## 2022-10-09 DIAGNOSIS — R569 Unspecified convulsions: Secondary | ICD-10-CM | POA: Diagnosis not present

## 2022-10-09 DIAGNOSIS — Z79899 Other long term (current) drug therapy: Secondary | ICD-10-CM | POA: Diagnosis not present

## 2022-10-09 DIAGNOSIS — H02401 Unspecified ptosis of right eyelid: Secondary | ICD-10-CM | POA: Diagnosis not present

## 2022-10-09 DIAGNOSIS — G252 Other specified forms of tremor: Secondary | ICD-10-CM | POA: Diagnosis not present

## 2022-10-09 DIAGNOSIS — R2689 Other abnormalities of gait and mobility: Secondary | ICD-10-CM | POA: Diagnosis not present

## 2022-10-13 DIAGNOSIS — R591 Generalized enlarged lymph nodes: Secondary | ICD-10-CM | POA: Diagnosis not present

## 2022-10-13 DIAGNOSIS — D649 Anemia, unspecified: Secondary | ICD-10-CM | POA: Diagnosis not present

## 2022-10-13 DIAGNOSIS — N1832 Chronic kidney disease, stage 3b: Secondary | ICD-10-CM | POA: Diagnosis not present

## 2022-10-17 DIAGNOSIS — R0602 Shortness of breath: Secondary | ICD-10-CM | POA: Diagnosis not present

## 2022-10-17 DIAGNOSIS — I2510 Atherosclerotic heart disease of native coronary artery without angina pectoris: Secondary | ICD-10-CM | POA: Diagnosis not present

## 2022-10-22 DIAGNOSIS — M47816 Spondylosis without myelopathy or radiculopathy, lumbar region: Secondary | ICD-10-CM | POA: Diagnosis not present

## 2022-10-22 DIAGNOSIS — M461 Sacroiliitis, not elsewhere classified: Secondary | ICD-10-CM | POA: Diagnosis not present

## 2022-10-22 DIAGNOSIS — K921 Melena: Secondary | ICD-10-CM | POA: Diagnosis not present

## 2022-10-22 DIAGNOSIS — E538 Deficiency of other specified B group vitamins: Secondary | ICD-10-CM | POA: Diagnosis not present

## 2022-10-22 DIAGNOSIS — Z79891 Long term (current) use of opiate analgesic: Secondary | ICD-10-CM | POA: Diagnosis not present

## 2022-10-22 DIAGNOSIS — D72818 Other decreased white blood cell count: Secondary | ICD-10-CM | POA: Diagnosis not present

## 2022-10-22 DIAGNOSIS — D696 Thrombocytopenia, unspecified: Secondary | ICD-10-CM | POA: Diagnosis not present

## 2022-10-22 DIAGNOSIS — M961 Postlaminectomy syndrome, not elsewhere classified: Secondary | ICD-10-CM | POA: Diagnosis not present

## 2022-10-22 DIAGNOSIS — M5136 Other intervertebral disc degeneration, lumbar region: Secondary | ICD-10-CM | POA: Diagnosis not present

## 2022-10-22 DIAGNOSIS — D500 Iron deficiency anemia secondary to blood loss (chronic): Secondary | ICD-10-CM | POA: Diagnosis not present

## 2022-10-30 DIAGNOSIS — Z638 Other specified problems related to primary support group: Secondary | ICD-10-CM | POA: Diagnosis not present

## 2022-10-30 DIAGNOSIS — R413 Other amnesia: Secondary | ICD-10-CM | POA: Diagnosis not present

## 2022-10-30 DIAGNOSIS — N644 Mastodynia: Secondary | ICD-10-CM | POA: Diagnosis not present

## 2022-10-30 DIAGNOSIS — R296 Repeated falls: Secondary | ICD-10-CM | POA: Diagnosis not present

## 2022-10-31 DIAGNOSIS — D500 Iron deficiency anemia secondary to blood loss (chronic): Secondary | ICD-10-CM | POA: Diagnosis not present

## 2022-11-04 DIAGNOSIS — M4722 Other spondylosis with radiculopathy, cervical region: Secondary | ICD-10-CM | POA: Diagnosis not present

## 2022-11-04 DIAGNOSIS — M4726 Other spondylosis with radiculopathy, lumbar region: Secondary | ICD-10-CM | POA: Diagnosis not present

## 2022-11-04 DIAGNOSIS — M48061 Spinal stenosis, lumbar region without neurogenic claudication: Secondary | ICD-10-CM | POA: Diagnosis not present

## 2022-11-04 DIAGNOSIS — M47814 Spondylosis without myelopathy or radiculopathy, thoracic region: Secondary | ICD-10-CM | POA: Diagnosis not present

## 2022-11-04 DIAGNOSIS — M4802 Spinal stenosis, cervical region: Secondary | ICD-10-CM | POA: Diagnosis not present

## 2022-11-04 DIAGNOSIS — M961 Postlaminectomy syndrome, not elsewhere classified: Secondary | ICD-10-CM | POA: Diagnosis not present

## 2022-11-04 DIAGNOSIS — G894 Chronic pain syndrome: Secondary | ICD-10-CM | POA: Diagnosis not present

## 2022-11-04 DIAGNOSIS — M5136 Other intervertebral disc degeneration, lumbar region: Secondary | ICD-10-CM | POA: Diagnosis not present

## 2022-11-04 DIAGNOSIS — M5030 Other cervical disc degeneration, unspecified cervical region: Secondary | ICD-10-CM | POA: Diagnosis not present

## 2022-11-07 DIAGNOSIS — D500 Iron deficiency anemia secondary to blood loss (chronic): Secondary | ICD-10-CM | POA: Diagnosis not present

## 2022-11-12 DIAGNOSIS — F32A Depression, unspecified: Secondary | ICD-10-CM | POA: Diagnosis not present

## 2022-11-12 DIAGNOSIS — R2689 Other abnormalities of gait and mobility: Secondary | ICD-10-CM | POA: Diagnosis not present

## 2022-11-12 DIAGNOSIS — M545 Low back pain, unspecified: Secondary | ICD-10-CM | POA: Diagnosis not present

## 2022-11-14 DIAGNOSIS — M438X4 Other specified deforming dorsopathies, thoracic region: Secondary | ICD-10-CM | POA: Diagnosis not present

## 2022-11-14 DIAGNOSIS — Z01818 Encounter for other preprocedural examination: Secondary | ICD-10-CM | POA: Diagnosis not present

## 2022-11-14 DIAGNOSIS — M47814 Spondylosis without myelopathy or radiculopathy, thoracic region: Secondary | ICD-10-CM | POA: Diagnosis not present

## 2022-11-14 DIAGNOSIS — M5124 Other intervertebral disc displacement, thoracic region: Secondary | ICD-10-CM | POA: Diagnosis not present

## 2022-11-19 DIAGNOSIS — R2689 Other abnormalities of gait and mobility: Secondary | ICD-10-CM | POA: Diagnosis not present

## 2022-11-19 DIAGNOSIS — F32A Depression, unspecified: Secondary | ICD-10-CM | POA: Diagnosis not present

## 2022-11-19 DIAGNOSIS — M545 Low back pain, unspecified: Secondary | ICD-10-CM | POA: Diagnosis not present

## 2022-11-26 DIAGNOSIS — H02834 Dermatochalasis of left upper eyelid: Secondary | ICD-10-CM | POA: Diagnosis not present

## 2022-11-26 DIAGNOSIS — H02831 Dermatochalasis of right upper eyelid: Secondary | ICD-10-CM | POA: Diagnosis not present

## 2022-11-26 DIAGNOSIS — H35371 Puckering of macula, right eye: Secondary | ICD-10-CM | POA: Diagnosis not present

## 2022-11-26 DIAGNOSIS — H35033 Hypertensive retinopathy, bilateral: Secondary | ICD-10-CM | POA: Diagnosis not present

## 2022-11-26 DIAGNOSIS — H5203 Hypermetropia, bilateral: Secondary | ICD-10-CM | POA: Diagnosis not present

## 2022-11-26 DIAGNOSIS — H52203 Unspecified astigmatism, bilateral: Secondary | ICD-10-CM | POA: Diagnosis not present

## 2022-11-26 DIAGNOSIS — H524 Presbyopia: Secondary | ICD-10-CM | POA: Diagnosis not present

## 2022-11-26 DIAGNOSIS — H43813 Vitreous degeneration, bilateral: Secondary | ICD-10-CM | POA: Diagnosis not present

## 2022-11-26 DIAGNOSIS — E119 Type 2 diabetes mellitus without complications: Secondary | ICD-10-CM | POA: Diagnosis not present

## 2022-11-26 DIAGNOSIS — H04123 Dry eye syndrome of bilateral lacrimal glands: Secondary | ICD-10-CM | POA: Diagnosis not present

## 2022-11-27 DIAGNOSIS — M545 Low back pain, unspecified: Secondary | ICD-10-CM | POA: Diagnosis not present

## 2022-11-27 DIAGNOSIS — F32A Depression, unspecified: Secondary | ICD-10-CM | POA: Diagnosis not present

## 2022-11-27 DIAGNOSIS — R2689 Other abnormalities of gait and mobility: Secondary | ICD-10-CM | POA: Diagnosis not present

## 2022-12-01 DIAGNOSIS — R2689 Other abnormalities of gait and mobility: Secondary | ICD-10-CM | POA: Diagnosis not present

## 2022-12-01 DIAGNOSIS — M545 Low back pain, unspecified: Secondary | ICD-10-CM | POA: Diagnosis not present

## 2022-12-01 DIAGNOSIS — F32A Depression, unspecified: Secondary | ICD-10-CM | POA: Diagnosis not present

## 2022-12-02 DIAGNOSIS — M961 Postlaminectomy syndrome, not elsewhere classified: Secondary | ICD-10-CM | POA: Diagnosis not present

## 2022-12-02 DIAGNOSIS — Z79891 Long term (current) use of opiate analgesic: Secondary | ICD-10-CM | POA: Diagnosis not present

## 2022-12-04 DIAGNOSIS — F0634 Mood disorder due to known physiological condition with mixed features: Secondary | ICD-10-CM | POA: Diagnosis not present

## 2022-12-05 DIAGNOSIS — F0634 Mood disorder due to known physiological condition with mixed features: Secondary | ICD-10-CM | POA: Diagnosis not present

## 2022-12-06 DIAGNOSIS — F0634 Mood disorder due to known physiological condition with mixed features: Secondary | ICD-10-CM | POA: Diagnosis not present

## 2022-12-07 DIAGNOSIS — F0634 Mood disorder due to known physiological condition with mixed features: Secondary | ICD-10-CM | POA: Diagnosis not present

## 2022-12-08 DIAGNOSIS — R2689 Other abnormalities of gait and mobility: Secondary | ICD-10-CM | POA: Diagnosis not present

## 2022-12-08 DIAGNOSIS — F32A Depression, unspecified: Secondary | ICD-10-CM | POA: Diagnosis not present

## 2022-12-08 DIAGNOSIS — M545 Low back pain, unspecified: Secondary | ICD-10-CM | POA: Diagnosis not present

## 2022-12-08 DIAGNOSIS — F0634 Mood disorder due to known physiological condition with mixed features: Secondary | ICD-10-CM | POA: Diagnosis not present

## 2022-12-09 DIAGNOSIS — F0634 Mood disorder due to known physiological condition with mixed features: Secondary | ICD-10-CM | POA: Diagnosis not present

## 2022-12-10 DIAGNOSIS — M5136 Other intervertebral disc degeneration, lumbar region: Secondary | ICD-10-CM | POA: Diagnosis not present

## 2022-12-10 DIAGNOSIS — M47816 Spondylosis without myelopathy or radiculopathy, lumbar region: Secondary | ICD-10-CM | POA: Diagnosis not present

## 2022-12-10 DIAGNOSIS — F0634 Mood disorder due to known physiological condition with mixed features: Secondary | ICD-10-CM | POA: Diagnosis not present

## 2022-12-10 DIAGNOSIS — M461 Sacroiliitis, not elsewhere classified: Secondary | ICD-10-CM | POA: Diagnosis not present

## 2022-12-10 DIAGNOSIS — Z79891 Long term (current) use of opiate analgesic: Secondary | ICD-10-CM | POA: Diagnosis not present

## 2022-12-10 DIAGNOSIS — M961 Postlaminectomy syndrome, not elsewhere classified: Secondary | ICD-10-CM | POA: Diagnosis not present

## 2022-12-11 DIAGNOSIS — Z880 Allergy status to penicillin: Secondary | ICD-10-CM | POA: Diagnosis not present

## 2022-12-11 DIAGNOSIS — M1611 Unilateral primary osteoarthritis, right hip: Secondary | ICD-10-CM | POA: Diagnosis not present

## 2022-12-11 DIAGNOSIS — R296 Repeated falls: Secondary | ICD-10-CM | POA: Diagnosis not present

## 2022-12-11 DIAGNOSIS — Z888 Allergy status to other drugs, medicaments and biological substances status: Secondary | ICD-10-CM | POA: Diagnosis not present

## 2022-12-11 DIAGNOSIS — M461 Sacroiliitis, not elsewhere classified: Secondary | ICD-10-CM | POA: Diagnosis not present

## 2022-12-11 DIAGNOSIS — F0634 Mood disorder due to known physiological condition with mixed features: Secondary | ICD-10-CM | POA: Diagnosis not present

## 2022-12-11 DIAGNOSIS — R591 Generalized enlarged lymph nodes: Secondary | ICD-10-CM | POA: Diagnosis not present

## 2022-12-11 DIAGNOSIS — Z881 Allergy status to other antibiotic agents status: Secondary | ICD-10-CM | POA: Diagnosis not present

## 2022-12-11 DIAGNOSIS — M25551 Pain in right hip: Secondary | ICD-10-CM | POA: Diagnosis not present

## 2022-12-11 DIAGNOSIS — R590 Localized enlarged lymph nodes: Secondary | ICD-10-CM | POA: Diagnosis not present

## 2022-12-12 DIAGNOSIS — F0634 Mood disorder due to known physiological condition with mixed features: Secondary | ICD-10-CM | POA: Diagnosis not present

## 2022-12-12 DIAGNOSIS — S060X0A Concussion without loss of consciousness, initial encounter: Secondary | ICD-10-CM | POA: Diagnosis not present

## 2022-12-12 DIAGNOSIS — M25551 Pain in right hip: Secondary | ICD-10-CM | POA: Diagnosis not present

## 2022-12-12 DIAGNOSIS — M25511 Pain in right shoulder: Secondary | ICD-10-CM | POA: Diagnosis not present

## 2022-12-12 DIAGNOSIS — R296 Repeated falls: Secondary | ICD-10-CM | POA: Diagnosis not present

## 2022-12-12 DIAGNOSIS — M25512 Pain in left shoulder: Secondary | ICD-10-CM | POA: Diagnosis not present

## 2022-12-12 DIAGNOSIS — M25552 Pain in left hip: Secondary | ICD-10-CM | POA: Diagnosis not present

## 2022-12-12 DIAGNOSIS — R413 Other amnesia: Secondary | ICD-10-CM | POA: Diagnosis not present

## 2022-12-12 DIAGNOSIS — R0781 Pleurodynia: Secondary | ICD-10-CM | POA: Diagnosis not present

## 2022-12-12 DIAGNOSIS — W19XXXA Unspecified fall, initial encounter: Secondary | ICD-10-CM | POA: Diagnosis not present

## 2022-12-15 DIAGNOSIS — M545 Low back pain, unspecified: Secondary | ICD-10-CM | POA: Diagnosis not present

## 2022-12-15 DIAGNOSIS — F32A Depression, unspecified: Secondary | ICD-10-CM | POA: Diagnosis not present

## 2022-12-15 DIAGNOSIS — R2689 Other abnormalities of gait and mobility: Secondary | ICD-10-CM | POA: Diagnosis not present

## 2022-12-23 DIAGNOSIS — M545 Low back pain, unspecified: Secondary | ICD-10-CM | POA: Diagnosis not present

## 2022-12-23 DIAGNOSIS — F32A Depression, unspecified: Secondary | ICD-10-CM | POA: Diagnosis not present

## 2022-12-23 DIAGNOSIS — R2689 Other abnormalities of gait and mobility: Secondary | ICD-10-CM | POA: Diagnosis not present

## 2022-12-25 DIAGNOSIS — R296 Repeated falls: Secondary | ICD-10-CM | POA: Diagnosis not present

## 2022-12-25 DIAGNOSIS — I872 Venous insufficiency (chronic) (peripheral): Secondary | ICD-10-CM | POA: Diagnosis not present

## 2022-12-25 DIAGNOSIS — R600 Localized edema: Secondary | ICD-10-CM | POA: Diagnosis not present

## 2022-12-25 DIAGNOSIS — S8012XD Contusion of left lower leg, subsequent encounter: Secondary | ICD-10-CM | POA: Diagnosis not present

## 2024-06-12 LAB — COLOGUARD: COLOGUARD: NEGATIVE

## 2025-01-03 ENCOUNTER — Other Ambulatory Visit (HOSPITAL_BASED_OUTPATIENT_CLINIC_OR_DEPARTMENT_OTHER): Payer: Self-pay

## 2025-01-05 ENCOUNTER — Other Ambulatory Visit (HOSPITAL_BASED_OUTPATIENT_CLINIC_OR_DEPARTMENT_OTHER): Payer: Self-pay

## 2025-01-05 ENCOUNTER — Other Ambulatory Visit: Payer: Self-pay

## 2025-01-05 ENCOUNTER — Ambulatory Visit: Payer: Self-pay | Admitting: Pharmacist

## 2025-01-05 ENCOUNTER — Other Ambulatory Visit (HOSPITAL_COMMUNITY): Payer: Self-pay

## 2025-01-05 NOTE — Progress Notes (Signed)
 Gabapentin  QID  Ferrous gluconate 324mg  every other day  Donepezil hcl 10mg  1.5 po nightly  Calcium Vit D3 600- 250 UAD  Buspirone  HCL 30mg  BID  Allergy relief (Cetirizine) 10mg  daily  Montelukast  10mg  daily  Memantine hcl 5mg  daily  Lamotrigine  200mg  BID  ASA 81mg  BID  Gemfibrozil  600mg  BID  Omeprazole 20mg  daily  Trazodone  100mg  2 tabs po nightly  Duloxetine 30mg  1 capsule BID  Levothyroxine  75mcg AM  Levetiracetam 500mg  BID  Trospium  20mg 

## 2025-01-06 ENCOUNTER — Other Ambulatory Visit (HOSPITAL_COMMUNITY): Payer: Self-pay

## 2025-01-06 MED ORDER — TROSPIUM CHLORIDE 20 MG PO TABS: 20.0000 mg | ORAL_TABLET | Freq: Two times a day (BID) | ORAL | 11 refills | Status: AC

## 2025-01-09 ENCOUNTER — Ambulatory Visit: Payer: Self-pay | Admitting: Pharmacist

## 2025-01-09 ENCOUNTER — Other Ambulatory Visit (HOSPITAL_COMMUNITY): Payer: Self-pay

## 2025-01-09 ENCOUNTER — Other Ambulatory Visit: Payer: Self-pay

## 2025-01-10 ENCOUNTER — Other Ambulatory Visit (HOSPITAL_COMMUNITY): Payer: Self-pay

## 2025-01-11 ENCOUNTER — Other Ambulatory Visit (HOSPITAL_COMMUNITY): Payer: Self-pay

## 2025-01-11 MED ORDER — LEVOTHYROXINE SODIUM 200 MCG PO TABS: 200.0000 ug | ORAL_TABLET | Freq: Every morning | ORAL | 3 refills | Status: DC

## 2025-01-12 ENCOUNTER — Other Ambulatory Visit: Payer: Self-pay

## 2025-01-13 ENCOUNTER — Other Ambulatory Visit: Payer: Self-pay

## 2025-01-13 ENCOUNTER — Other Ambulatory Visit (HOSPITAL_COMMUNITY): Payer: Self-pay

## 2025-01-13 MED ORDER — LEVOTHYROXINE SODIUM 200 MCG PO TABS
200.0000 ug | ORAL_TABLET | Freq: Every morning | ORAL | 3 refills | Status: AC
  Filled 2025-01-13 – 2025-01-26 (×2): qty 90, 90d supply, fill #0

## 2025-01-16 ENCOUNTER — Other Ambulatory Visit: Payer: Self-pay

## 2025-01-19 ENCOUNTER — Other Ambulatory Visit (HOSPITAL_COMMUNITY): Payer: Self-pay

## 2025-01-19 ENCOUNTER — Other Ambulatory Visit (HOSPITAL_BASED_OUTPATIENT_CLINIC_OR_DEPARTMENT_OTHER): Payer: Self-pay

## 2025-01-20 ENCOUNTER — Other Ambulatory Visit (HOSPITAL_COMMUNITY): Payer: Self-pay

## 2025-01-20 ENCOUNTER — Other Ambulatory Visit (HOSPITAL_BASED_OUTPATIENT_CLINIC_OR_DEPARTMENT_OTHER): Payer: Self-pay

## 2025-01-23 ENCOUNTER — Other Ambulatory Visit: Payer: Self-pay

## 2025-01-23 ENCOUNTER — Other Ambulatory Visit (HOSPITAL_COMMUNITY): Payer: Self-pay

## 2025-01-23 ENCOUNTER — Other Ambulatory Visit (HOSPITAL_BASED_OUTPATIENT_CLINIC_OR_DEPARTMENT_OTHER): Payer: Self-pay

## 2025-01-23 MED ORDER — TROSPIUM CHLORIDE 20 MG PO TABS
20.0000 mg | ORAL_TABLET | Freq: Two times a day (BID) | ORAL | 11 refills | Status: AC
  Filled 2025-01-26 – 2025-01-30 (×2): qty 60, 30d supply, fill #0

## 2025-01-23 MED ORDER — LEVOTHYROXINE SODIUM 175 MCG PO TABS
175.0000 ug | ORAL_TABLET | Freq: Every morning | ORAL | 3 refills | Status: AC
  Filled 2025-01-26: qty 90, 90d supply, fill #0

## 2025-01-23 MED ORDER — DULOXETINE HCL 30 MG PO CPEP
30.0000 mg | ORAL_CAPSULE | Freq: Two times a day (BID) | ORAL | 3 refills | Status: AC
  Filled 2025-01-26 – 2025-01-30 (×2): qty 60, 30d supply, fill #0

## 2025-01-23 MED ORDER — SENNOSIDES-DOCUSATE SODIUM 8.6-50 MG PO TABS
2.0000 | ORAL_TABLET | Freq: Every day | ORAL | 1 refills | Status: AC
  Filled 2025-01-26: qty 180, 90d supply, fill #0

## 2025-01-23 MED ORDER — LEVETIRACETAM 500 MG PO TABS
500.0000 mg | ORAL_TABLET | Freq: Two times a day (BID) | ORAL | 3 refills | Status: AC
  Filled 2025-01-26: qty 180, 90d supply, fill #0
  Filled 2025-01-30: qty 60, 30d supply, fill #0

## 2025-01-23 MED ORDER — BUSPIRONE HCL 30 MG PO TABS
30.0000 mg | ORAL_TABLET | Freq: Two times a day (BID) | ORAL | 3 refills | Status: AC
  Filled 2025-01-26: qty 60, 30d supply, fill #0

## 2025-01-23 MED ORDER — TRAZODONE HCL 100 MG PO TABS
200.0000 mg | ORAL_TABLET | Freq: Every evening | ORAL | 3 refills | Status: AC
  Filled 2025-01-26: qty 180, 90d supply, fill #0
  Filled 2025-01-30: qty 60, 30d supply, fill #0

## 2025-01-23 MED ORDER — DONEPEZIL HCL 10 MG PO TABS
15.0000 mg | ORAL_TABLET | Freq: Every evening | ORAL | 11 refills | Status: AC
  Filled 2025-01-26: qty 45, 30d supply, fill #0

## 2025-01-23 MED ORDER — GABAPENTIN 600 MG PO TABS
600.0000 mg | ORAL_TABLET | Freq: Four times a day (QID) | ORAL | 3 refills | Status: AC | PRN
  Filled 2025-01-26: qty 120, 30d supply, fill #0

## 2025-01-23 MED ORDER — FERROUS GLUCONATE 324 (38 FE) MG PO TABS
324.0000 mg | ORAL_TABLET | Freq: Every day | ORAL | 3 refills | Status: AC
  Filled 2025-01-26: qty 30, 30d supply, fill #0

## 2025-01-24 ENCOUNTER — Other Ambulatory Visit (HOSPITAL_COMMUNITY): Payer: Self-pay

## 2025-01-25 ENCOUNTER — Other Ambulatory Visit (HOSPITAL_BASED_OUTPATIENT_CLINIC_OR_DEPARTMENT_OTHER): Payer: Self-pay

## 2025-01-25 ENCOUNTER — Other Ambulatory Visit: Payer: Self-pay

## 2025-01-25 ENCOUNTER — Other Ambulatory Visit (HOSPITAL_COMMUNITY): Payer: Self-pay

## 2025-01-26 ENCOUNTER — Other Ambulatory Visit: Payer: Self-pay

## 2025-01-28 ENCOUNTER — Other Ambulatory Visit (HOSPITAL_COMMUNITY): Payer: Self-pay

## 2025-01-29 ENCOUNTER — Other Ambulatory Visit: Payer: Self-pay

## 2025-01-29 ENCOUNTER — Other Ambulatory Visit (HOSPITAL_COMMUNITY): Payer: Self-pay

## 2025-01-29 MED ORDER — MEMANTINE HCL 5 MG PO TABS
5.0000 mg | ORAL_TABLET | Freq: Every evening | ORAL | 3 refills | Status: AC
  Filled 2025-01-29: qty 90, 90d supply, fill #0

## 2025-01-29 MED ORDER — GEMFIBROZIL 600 MG PO TABS
600.0000 mg | ORAL_TABLET | Freq: Two times a day (BID) | ORAL | 2 refills | Status: AC
  Filled 2025-01-29: qty 60, 30d supply, fill #0

## 2025-01-29 MED ORDER — LEVOTHYROXINE SODIUM 175 MCG PO TABS
175.0000 ug | ORAL_TABLET | Freq: Every morning | ORAL | 3 refills | Status: AC
  Filled 2025-01-29: qty 90, 90d supply, fill #0

## 2025-01-29 MED ORDER — MONTELUKAST SODIUM 10 MG PO TABS
10.0000 mg | ORAL_TABLET | Freq: Every day | ORAL | 3 refills | Status: AC
  Filled 2025-01-29: qty 90, 90d supply, fill #0

## 2025-01-29 MED ORDER — OMEPRAZOLE 20 MG PO CPDR
20.0000 mg | DELAYED_RELEASE_CAPSULE | Freq: Every morning | ORAL | 3 refills | Status: AC
  Filled 2025-01-29: qty 90, 90d supply, fill #0

## 2025-01-29 MED ORDER — CALCIUM CARB-CHOLECALCIFEROL 600-10 MG-MCG PO TABS
1.0000 | ORAL_TABLET | Freq: Every day | ORAL | 3 refills | Status: AC
  Filled 2025-01-29: qty 30, 30d supply, fill #0

## 2025-01-29 MED ORDER — GABAPENTIN 600 MG PO TABS
600.0000 mg | ORAL_TABLET | Freq: Three times a day (TID) | ORAL | 3 refills | Status: AC
  Filled 2025-01-29 – 2025-01-30 (×2): qty 120, 30d supply, fill #0

## 2025-01-29 MED ORDER — LAMOTRIGINE 200 MG PO TABS
200.0000 mg | ORAL_TABLET | Freq: Two times a day (BID) | ORAL | 3 refills | Status: AC
  Filled 2025-01-29: qty 180, 90d supply, fill #0

## 2025-01-29 MED ORDER — ASPIRIN 81 MG PO TBEC
81.0000 mg | DELAYED_RELEASE_TABLET | ORAL | 3 refills | Status: AC
  Filled 2025-01-29: qty 180, 90d supply, fill #0

## 2025-01-30 ENCOUNTER — Other Ambulatory Visit (HOSPITAL_COMMUNITY): Payer: Self-pay

## 2025-01-30 ENCOUNTER — Other Ambulatory Visit: Payer: Self-pay

## 2025-01-30 MED ORDER — CETIRIZINE HCL 10 MG PO TABS
10.0000 mg | ORAL_TABLET | Freq: Every day | ORAL | 3 refills | Status: AC
  Filled 2025-01-30: qty 30, 30d supply, fill #0

## 2025-01-30 MED ORDER — DONEPEZIL HCL 5 MG PO TABS
15.0000 mg | ORAL_TABLET | Freq: Every day | ORAL | 11 refills | Status: AC
  Filled 2025-01-30: qty 90, 30d supply, fill #0

## 2025-01-31 ENCOUNTER — Other Ambulatory Visit: Payer: Self-pay

## 2025-02-01 ENCOUNTER — Other Ambulatory Visit: Payer: Self-pay
